# Patient Record
Sex: Female | Born: 1950
Health system: Southern US, Community
[De-identification: ages and names within clinical notes are randomized; demographics above are authoritative.]

## PROBLEM LIST (undated history)

## (undated) DIAGNOSIS — I10 Essential (primary) hypertension: Secondary | ICD-10-CM

## (undated) DIAGNOSIS — T7840XA Allergy, unspecified, initial encounter: Secondary | ICD-10-CM

## (undated) DIAGNOSIS — H8109 Meniere's disease, unspecified ear: Secondary | ICD-10-CM

## (undated) DIAGNOSIS — S92413A Displaced fracture of proximal phalanx of unspecified great toe, initial encounter for closed fracture: Secondary | ICD-10-CM

## (undated) DIAGNOSIS — E119 Type 2 diabetes mellitus without complications: Secondary | ICD-10-CM

## (undated) DIAGNOSIS — G629 Polyneuropathy, unspecified: Secondary | ICD-10-CM

## (undated) DIAGNOSIS — E785 Hyperlipidemia, unspecified: Secondary | ICD-10-CM

## (undated) DIAGNOSIS — E559 Vitamin D deficiency, unspecified: Secondary | ICD-10-CM

## (undated) DIAGNOSIS — E611 Iron deficiency: Secondary | ICD-10-CM

## (undated) HISTORY — DX: Allergy, unspecified, initial encounter: T78.40XA

## (undated) HISTORY — DX: Vitamin D deficiency, unspecified: E55.9

## (undated) HISTORY — DX: Hyperlipidemia, unspecified: E78.5

## (undated) HISTORY — DX: Polyneuropathy, unspecified: G62.9

## (undated) HISTORY — DX: Meniere's disease, unspecified ear: H81.09

## (undated) HISTORY — DX: Type 2 diabetes mellitus without complications: E11.9

## (undated) HISTORY — DX: Essential (primary) hypertension: I10

## (undated) HISTORY — DX: Iron deficiency: E61.1

---
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
## 1996-09-19 HISTORY — PX: ABDOMINAL HYSTERECTOMY: SHX81

## 1998-09-19 HISTORY — PX: ORIF ANKLE FRACTURE: SUR919

## 1999-07-19 ENCOUNTER — Encounter: Admission: RE | Admit: 1999-07-19 | Discharge: 1999-07-19 | Payer: Self-pay | Admitting: Obstetrics and Gynecology

## 1999-07-19 ENCOUNTER — Encounter: Payer: Self-pay | Admitting: Obstetrics and Gynecology

## 1999-11-01 ENCOUNTER — Ambulatory Visit (HOSPITAL_COMMUNITY): Admission: RE | Admit: 1999-11-01 | Discharge: 1999-11-01 | Payer: Self-pay | Admitting: Family Medicine

## 1999-11-01 ENCOUNTER — Encounter: Payer: Self-pay | Admitting: Family Medicine

## 2000-07-16 ENCOUNTER — Emergency Department (HOSPITAL_COMMUNITY): Admission: EM | Admit: 2000-07-16 | Discharge: 2000-07-16 | Payer: Self-pay | Admitting: Emergency Medicine

## 2001-07-02 ENCOUNTER — Encounter: Admission: RE | Admit: 2001-07-02 | Discharge: 2001-07-02 | Payer: Self-pay | Admitting: Family Medicine

## 2001-07-02 ENCOUNTER — Encounter: Payer: Self-pay | Admitting: Family Medicine

## 2001-10-05 ENCOUNTER — Encounter: Payer: Self-pay | Admitting: Orthopaedic Surgery

## 2001-10-05 ENCOUNTER — Encounter: Payer: Self-pay | Admitting: Emergency Medicine

## 2001-10-05 ENCOUNTER — Emergency Department (HOSPITAL_COMMUNITY): Admission: EM | Admit: 2001-10-05 | Discharge: 2001-10-05 | Payer: Self-pay | Admitting: Emergency Medicine

## 2002-11-01 ENCOUNTER — Encounter: Admission: RE | Admit: 2002-11-01 | Discharge: 2002-11-01 | Payer: Self-pay | Admitting: Internal Medicine

## 2002-11-01 ENCOUNTER — Encounter: Payer: Self-pay | Admitting: Internal Medicine

## 2003-02-18 ENCOUNTER — Encounter: Admission: RE | Admit: 2003-02-18 | Discharge: 2003-05-19 | Payer: Self-pay | Admitting: Internal Medicine

## 2003-08-05 ENCOUNTER — Encounter: Admission: RE | Admit: 2003-08-05 | Discharge: 2003-11-03 | Payer: Self-pay | Admitting: Internal Medicine

## 2005-10-10 ENCOUNTER — Emergency Department (HOSPITAL_COMMUNITY): Admission: EM | Admit: 2005-10-10 | Discharge: 2005-10-10 | Payer: Self-pay | Admitting: Emergency Medicine

## 2006-02-17 ENCOUNTER — Encounter: Admission: RE | Admit: 2006-02-17 | Discharge: 2006-02-17 | Payer: Self-pay | Admitting: Internal Medicine

## 2007-02-20 ENCOUNTER — Encounter: Admission: RE | Admit: 2007-02-20 | Discharge: 2007-02-20 | Payer: Self-pay | Admitting: Internal Medicine

## 2007-11-02 ENCOUNTER — Encounter: Admission: RE | Admit: 2007-11-02 | Discharge: 2007-11-02 | Payer: Self-pay | Admitting: Internal Medicine

## 2007-12-31 ENCOUNTER — Ambulatory Visit (HOSPITAL_COMMUNITY): Admission: RE | Admit: 2007-12-31 | Discharge: 2007-12-31 | Payer: Self-pay | Admitting: Internal Medicine

## 2008-02-22 ENCOUNTER — Encounter: Admission: RE | Admit: 2008-02-22 | Discharge: 2008-02-22 | Payer: Self-pay | Admitting: Internal Medicine

## 2009-03-13 ENCOUNTER — Ambulatory Visit: Payer: Self-pay | Admitting: Gastroenterology

## 2009-03-27 ENCOUNTER — Ambulatory Visit: Payer: Self-pay | Admitting: Gastroenterology

## 2009-07-14 ENCOUNTER — Encounter: Admission: RE | Admit: 2009-07-14 | Discharge: 2009-07-14 | Payer: Self-pay | Admitting: Internal Medicine

## 2009-10-22 ENCOUNTER — Emergency Department (HOSPITAL_COMMUNITY): Admission: EM | Admit: 2009-10-22 | Discharge: 2009-10-22 | Payer: Self-pay | Admitting: Emergency Medicine

## 2009-10-24 ENCOUNTER — Emergency Department (HOSPITAL_COMMUNITY): Admission: EM | Admit: 2009-10-24 | Discharge: 2009-10-24 | Payer: Self-pay | Admitting: Emergency Medicine

## 2009-11-03 ENCOUNTER — Emergency Department (HOSPITAL_COMMUNITY): Admission: EM | Admit: 2009-11-03 | Discharge: 2009-11-03 | Payer: Self-pay | Admitting: Emergency Medicine

## 2010-08-03 ENCOUNTER — Encounter: Admission: RE | Admit: 2010-08-03 | Discharge: 2010-08-03 | Payer: Self-pay | Admitting: Internal Medicine

## 2010-12-02 ENCOUNTER — Ambulatory Visit
Admission: RE | Admit: 2010-12-02 | Discharge: 2010-12-02 | Disposition: A | Discharge status: Home / Self Care | Payer: BC Managed Care – PPO | Source: Ambulatory Visit | Attending: Internal Medicine | Admitting: Internal Medicine

## 2010-12-02 ENCOUNTER — Other Ambulatory Visit: Payer: Self-pay | Admitting: Internal Medicine

## 2010-12-02 DIAGNOSIS — R059 Cough, unspecified: Secondary | ICD-10-CM

## 2010-12-02 DIAGNOSIS — R05 Cough: Secondary | ICD-10-CM

## 2010-12-26 LAB — GLUCOSE, CAPILLARY
Glucose-Capillary: 110 mg/dL — ABNORMAL HIGH (ref 70–99)
Glucose-Capillary: 127 mg/dL — ABNORMAL HIGH (ref 70–99)

## 2011-09-27 ENCOUNTER — Other Ambulatory Visit: Payer: Self-pay | Admitting: Internal Medicine

## 2011-09-27 DIAGNOSIS — Z1231 Encounter for screening mammogram for malignant neoplasm of breast: Secondary | ICD-10-CM

## 2011-10-20 ENCOUNTER — Other Ambulatory Visit: Payer: Self-pay | Admitting: Internal Medicine

## 2011-10-20 ENCOUNTER — Ambulatory Visit
Admission: RE | Admit: 2011-10-20 | Discharge: 2011-10-20 | Disposition: A | Discharge status: Home / Self Care | Payer: Managed Care, Other (non HMO) | Source: Ambulatory Visit | Attending: Internal Medicine | Admitting: Internal Medicine

## 2011-10-24 ENCOUNTER — Ambulatory Visit
Admission: RE | Admit: 2011-10-24 | Discharge: 2011-10-24 | Disposition: A | Discharge status: Home / Self Care | Payer: Managed Care, Other (non HMO) | Source: Ambulatory Visit | Attending: Internal Medicine | Admitting: Internal Medicine

## 2011-10-24 DIAGNOSIS — Z1231 Encounter for screening mammogram for malignant neoplasm of breast: Secondary | ICD-10-CM

## 2012-09-21 ENCOUNTER — Other Ambulatory Visit: Payer: Self-pay | Admitting: Internal Medicine

## 2012-09-21 DIAGNOSIS — K824 Cholesterolosis of gallbladder: Secondary | ICD-10-CM

## 2012-09-25 ENCOUNTER — Ambulatory Visit
Admission: RE | Admit: 2012-09-25 | Discharge: 2012-09-25 | Disposition: A | Discharge status: Home / Self Care | Payer: Managed Care, Other (non HMO) | Source: Ambulatory Visit | Attending: Internal Medicine | Admitting: Internal Medicine

## 2012-09-25 DIAGNOSIS — K824 Cholesterolosis of gallbladder: Secondary | ICD-10-CM

## 2012-11-05 ENCOUNTER — Other Ambulatory Visit (HOSPITAL_COMMUNITY): Payer: Self-pay | Admitting: Internal Medicine

## 2012-11-05 DIAGNOSIS — Z1231 Encounter for screening mammogram for malignant neoplasm of breast: Secondary | ICD-10-CM

## 2012-12-03 ENCOUNTER — Ambulatory Visit (HOSPITAL_COMMUNITY): Payer: Managed Care, Other (non HMO)

## 2012-12-10 ENCOUNTER — Other Ambulatory Visit (HOSPITAL_COMMUNITY): Payer: Self-pay | Admitting: Internal Medicine

## 2012-12-10 DIAGNOSIS — Z1231 Encounter for screening mammogram for malignant neoplasm of breast: Secondary | ICD-10-CM

## 2012-12-25 ENCOUNTER — Ambulatory Visit (HOSPITAL_COMMUNITY)
Admission: RE | Admit: 2012-12-25 | Discharge: 2012-12-25 | Disposition: A | Discharge status: Home / Self Care | Payer: Managed Care, Other (non HMO) | Source: Ambulatory Visit | Attending: Internal Medicine | Admitting: Internal Medicine

## 2012-12-25 ENCOUNTER — Ambulatory Visit (HOSPITAL_COMMUNITY): Payer: Managed Care, Other (non HMO)

## 2012-12-25 DIAGNOSIS — Z1231 Encounter for screening mammogram for malignant neoplasm of breast: Secondary | ICD-10-CM | POA: Insufficient documentation

## 2013-03-24 IMAGING — US US ABDOMEN COMPLETE
1 series · 14 of 25 positions shown · non-contrast
Comparison: Report of 11/01/1999.  Prior images not available.

CLINICAL DATA: Abdominal pain for 2 months.  Type 2 diabetes.

COMPLETE ABDOMINAL ULTRASOUND

[Series 1: us abdomen complete · 0.28mm/px · 14 of 67 slices shown]
[im 1/67]
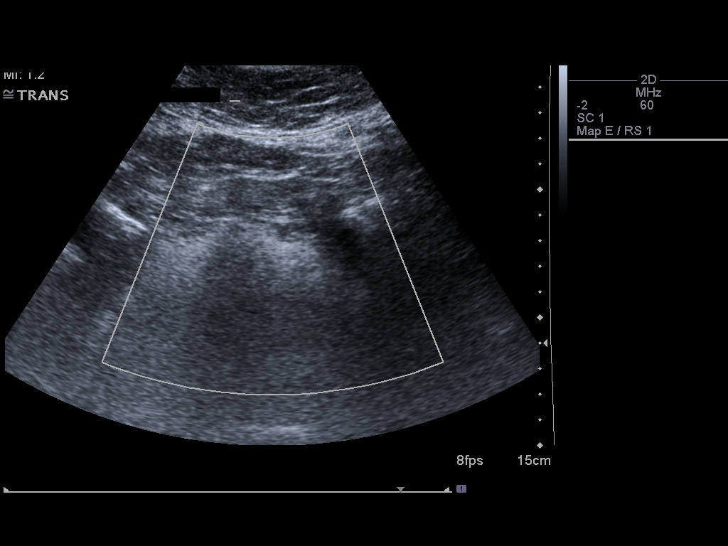
[im 6/67]
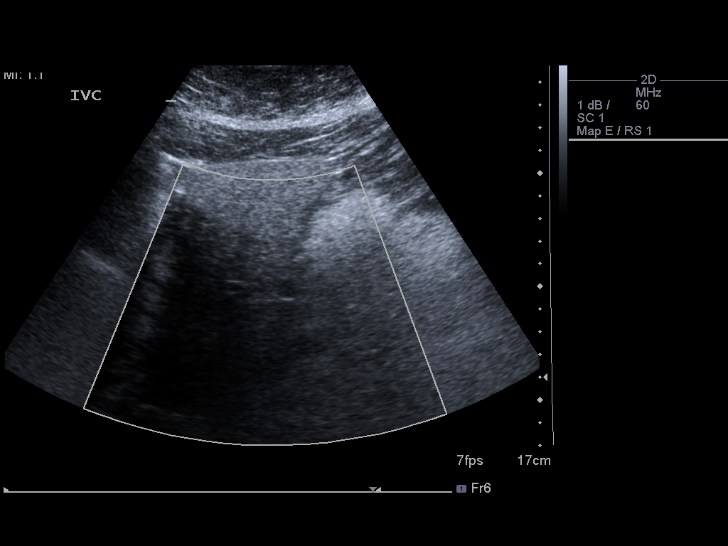
[im 12/67]
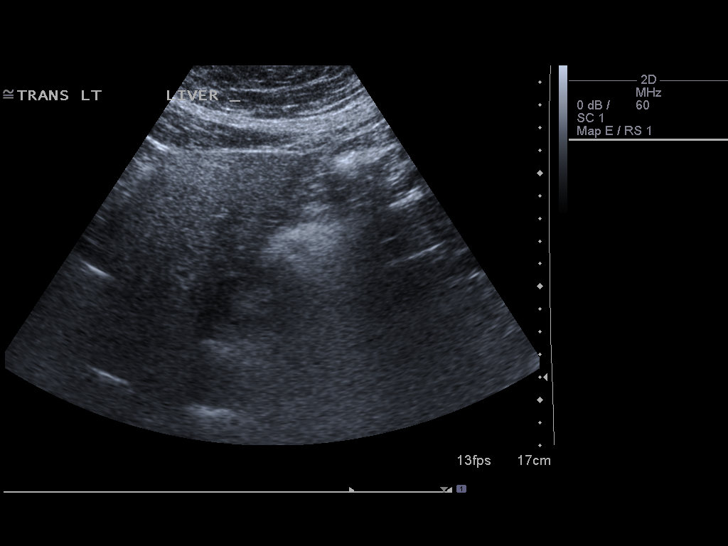
[im 17/67]
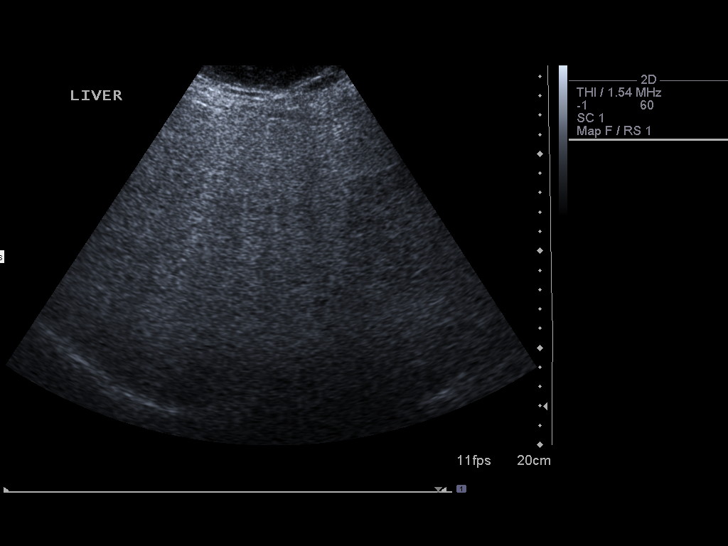
[im 23/67]
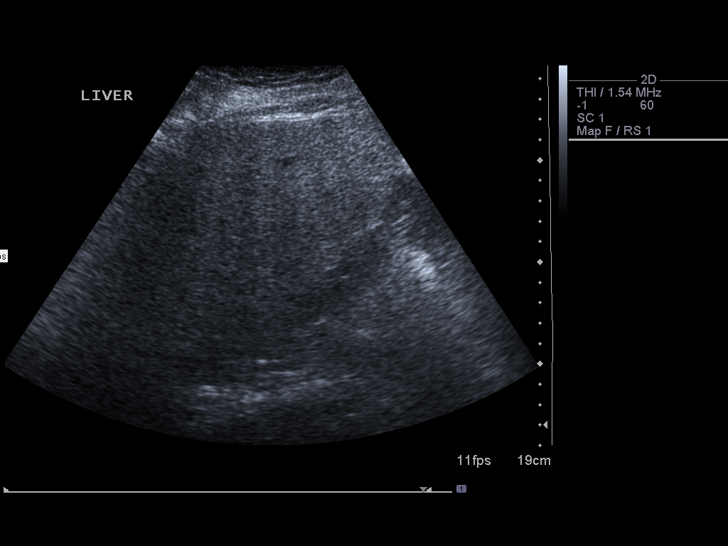
[im 25/67]
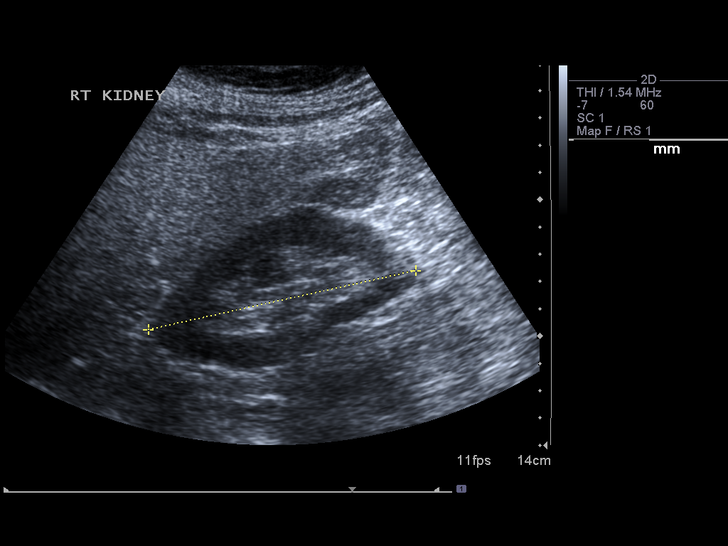
[im 31/67]
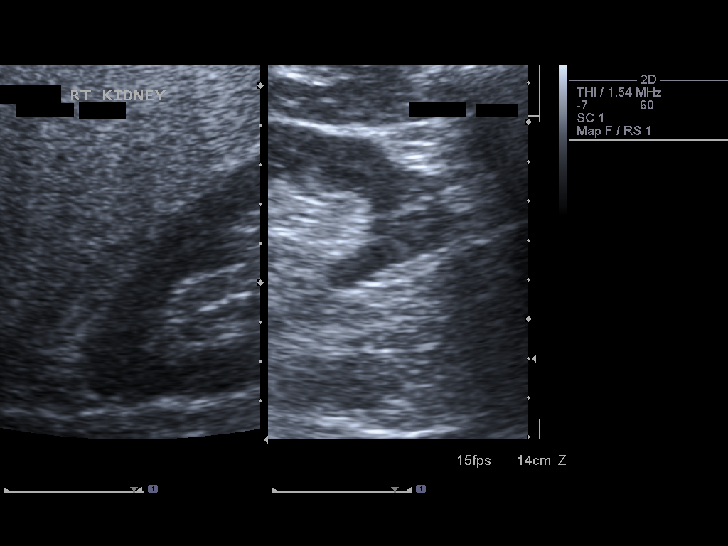
[im 36/67]
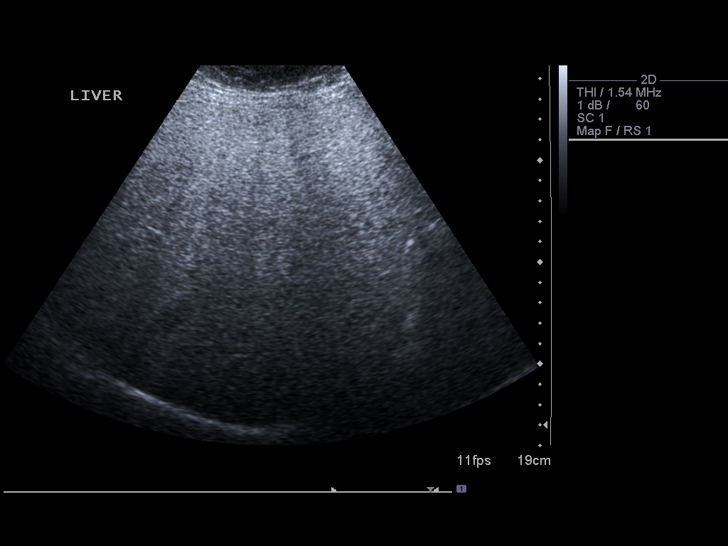
[im 42/67]
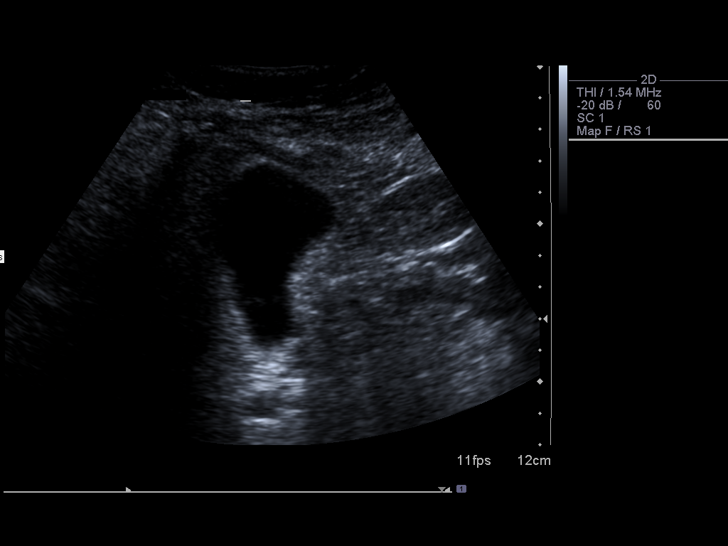
[im 45/67]
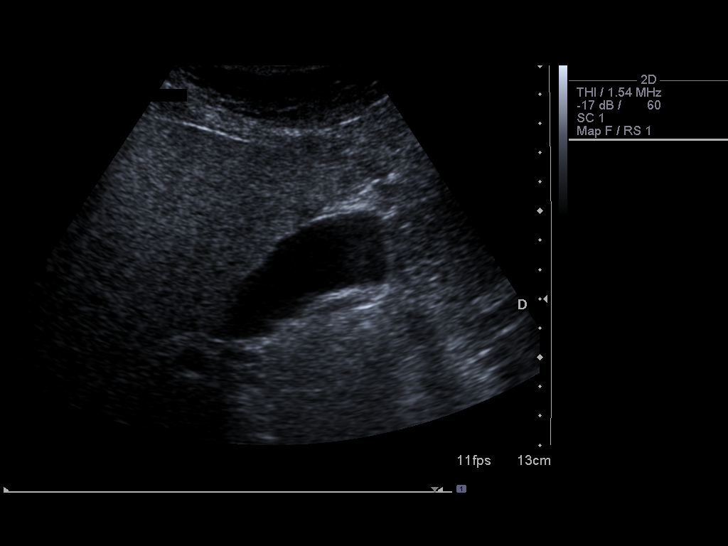
[im 50/67]
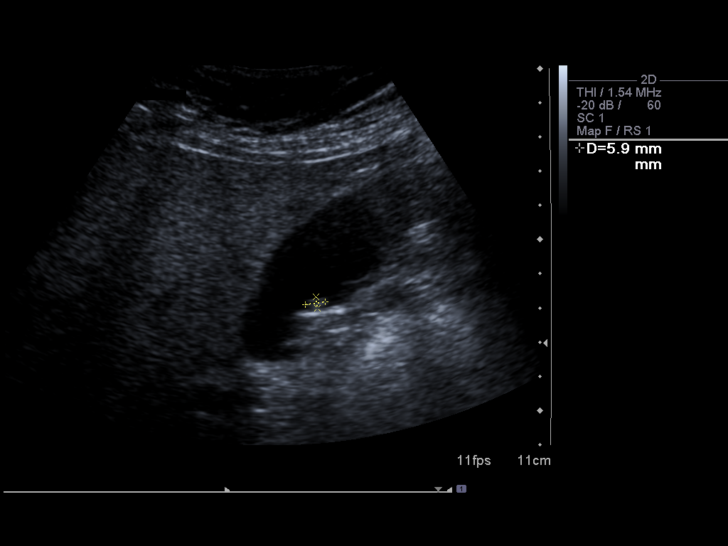
[im 56/67]
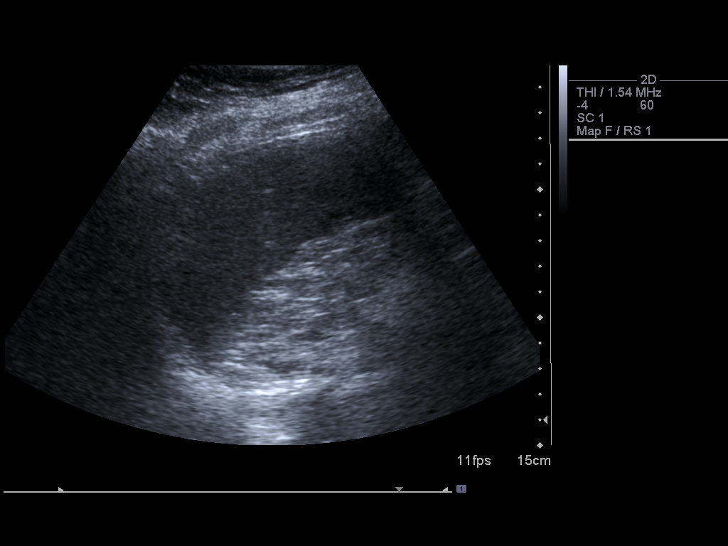
[im 61/67]
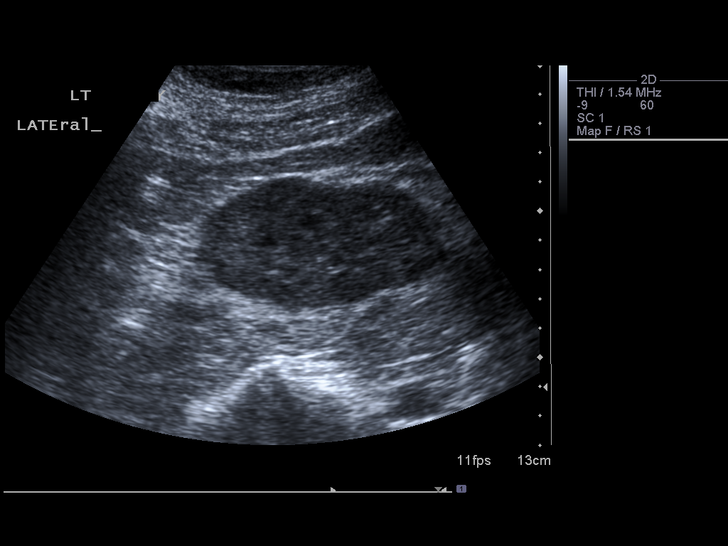
[im 67/67]
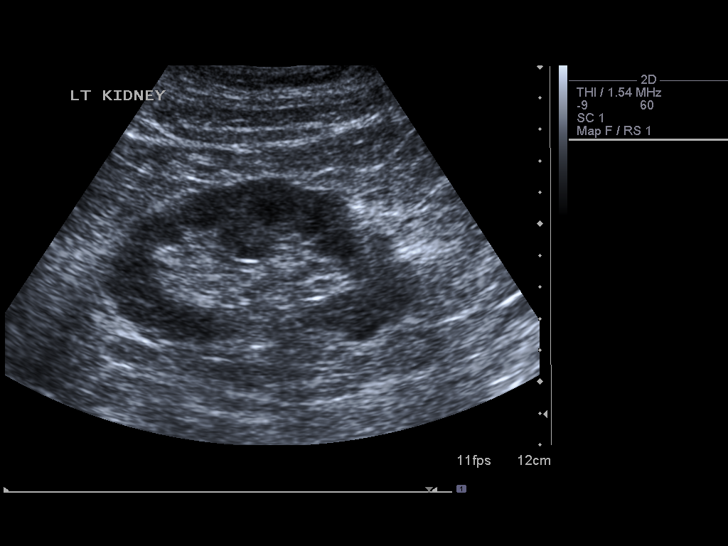

[14 of 25 positions shown; findings below may reference images not displayed]

FINDINGS: Gallbladder:  6 mm echogenic focus within the gallbladder is
immobile and not definitely shadowing.  No wall thickening or
pericholecystic fluid.

Common bile duct: Normal, 2 mm.

Liver: Moderate to markedly increased in echogenicity.

IVC: Negative

Pancreas:  Poorly visualized due to overlying bowel gas.

Spleen:  Normal in size and echogenicity.

Right Kidney:  10.5 cm. No hydronephrosis.

Left Kidney:  10.3 cm. No hydronephrosis.

Abdominal aorta:  Nonaneurysmal without ascites.
IMPRESSION: 1.  Hepatic steatosis, moderate to marked in severity.
2.  6 mm gallbladder polyp versus less likely nonshadowing stone.
Consider ultrasound follow-up at 1 year to confirm stability.
3.  Portions of anatomy obscured by bowel gas.

## 2013-10-20 ENCOUNTER — Other Ambulatory Visit: Payer: Self-pay | Admitting: Physician Assistant

## 2013-10-20 DIAGNOSIS — E559 Vitamin D deficiency, unspecified: Secondary | ICD-10-CM | POA: Insufficient documentation

## 2013-10-20 DIAGNOSIS — H8109 Meniere's disease, unspecified ear: Secondary | ICD-10-CM | POA: Insufficient documentation

## 2013-10-20 DIAGNOSIS — I10 Essential (primary) hypertension: Secondary | ICD-10-CM | POA: Insufficient documentation

## 2013-10-20 DIAGNOSIS — E1169 Type 2 diabetes mellitus with other specified complication: Secondary | ICD-10-CM | POA: Insufficient documentation

## 2013-10-20 DIAGNOSIS — E785 Hyperlipidemia, unspecified: Secondary | ICD-10-CM

## 2013-10-21 DIAGNOSIS — Z79899 Other long term (current) drug therapy: Secondary | ICD-10-CM | POA: Insufficient documentation

## 2013-10-21 NOTE — Progress Notes (Signed)
This very nice 63 y.o. female presents for 3 month follow up with Hypertension, Hyperlipidemia, Pre-Diabetes and Vitamin D Deficiency.    HTN predates since   . BP has been controlled at home. Today's   . Patient denies any cardiac type chest pain, palpitations, dyspnea/orthopnea/PND, dizziness, claudication, or dependent edema.   Hyperlipidemia is controlled with diet & meds. Last Cholesterol was  , Triglycerides were    , HDL    and LDL    . Patient denies myalgias or other med SE's.    Also, the patient has history of PreDiabetes/insulin resistance since     with last A1c of    . Patient denies any symptoms of reactive hypoglycemia, diabetic polys, paresthesias or visual blurring.   Further, Patient has history of Vitamin D Deficiency with last vitamin D of   . Patient supplements vitamin D without any suspected side-effects.    Medication List       This list is accurate as of: 10/21/13  1:31 PM.  Always use your most recent med list.               aspirin 81 MG chewable tablet  Chew by mouth daily.     bisoprolol-hydrochlorothiazide 10-6.25 MG per tablet  Commonly known as:  ZIAC  Take 1 tablet by mouth daily.     hydrochlorothiazide 12.5 MG capsule  Commonly known as:  MICROZIDE  Take 12.5 mg by mouth daily.     LEVSIN 0.125 MG tablet  Generic drug:  hyoscyamine  Take 0.125 mg by mouth every 4 (four) hours as needed.     Magnesium 500 MG Caps  Take 500 mg by mouth daily.     metFORMIN 500 MG 24 hr tablet  Commonly known as:  GLUCOPHAGE-XR  Take 500 mg by mouth QID.     montelukast 10 MG tablet  Commonly known as:  SINGULAIR  TAKE 1 TABLET BY MOUTH EVERY DAY     phentermine 37.5 MG tablet  Commonly known as:  ADIPEX-P  Take 37.5 mg by mouth daily before breakfast.     promethazine 25 MG tablet  Commonly known as:  PHENERGAN  Take 25 mg by mouth every 6 (six) hours as needed for nausea or vomiting.     simvastatin 20 MG tablet  Commonly known as:  ZOCOR  Take  20 mg by mouth daily.     VITAMIN D PO  Take 4,000 Int'l Units by mouth daily.         Allergies  Allergen Reactions  . Penicillins     PMHx:   Past Medical History  Diagnosis Date  . Hyperlipidemia   . Hypertension   . Type II or unspecified type diabetes mellitus without mention of complication, not stated as uncontrolled   . Neuropathy   . Allergy   . Iron deficiency   . Vitamin D deficiency   . Meniere's disease     FHx:    Reviewed / unchanged  SHx:    Reviewed / unchanged  Systems Review: Constitutional: Denies fever, chills, wt changes, headaches, insomnia, fatigue, night sweats, change in appetite. Eyes: Denies redness, blurred vision, diplopia, discharge, itchy, watery eyes.  ENT: Denies discharge, congestion, post nasal drip, epistaxis, sore throat, earache, hearing loss, dental pain, tinnitus, vertigo, sinus pain, snoring.  CV: Denies chest pain, palpitations, irregular heartbeat, syncope, dyspnea, diaphoresis, orthopnea, PND, claudication, edema. Respiratory: denies cough, dyspnea, DOE, pleurisy, hoarseness, laryngitis, wheezing.  Gastrointestinal: Denies dysphagia, odynophagia, heartburn, reflux, water  brash, abdominal pain or cramps, nausea, vomiting, bloating, diarrhea, constipation, hematemesis, melena, hematochezia,  or hemorrhoids. Genitourinary: Denies dysuria, frequency, urgency, nocturia, hesitancy, discharge, hematuria, flank pain. Musculoskeletal: Denies arthralgias, myalgias, stiffness, jt. swelling, pain, limp, strain/sprain.  Skin: Denies pruritus, rash, hives, warts, acne, eczema, change in skin lesion(s). Neuro: No weakness, tremor, incoordination, spasms, paresthesia, or pain. Psychiatric: Denies confusion, memory loss, or sensory loss. Endo: Denies change in weight, skin, hair change.  Heme/Lymph: No excessive bleeding, bruising, orenlarged lymph nodes.  There were no vitals filed for this visit.  There is no height or weight on file to  calculate BMI.  On Exam: Appears well nourished - in no distress. Eyes: PERRLA, EOMs, conjunctiva no swelling or erythema. Sinuses: No frontal/maxillary tenderness ENT/Mouth: EAC's clear, TM's nl w/o erythema, bulging. Nares clear w/o erythema, swelling, exudates. Oropharynx clear without erythema or exudates. Oral hygiene is good. Tongue normal, non obstructing. Hearing intact.  Neck: Supple. Thyroid nl. Car 2+/2+ without bruits, nodes or JVD. Chest: Respirations nl with BS clear & equal w/o rales, rhonchi, wheezing or stridor.  Cor: Heart sounds normal w/ regular rate and rhythm without sig. murmurs, gallops, clicks, or rubs. Peripheral pulses normal and equal  without edema.  Abdomen: Soft & bowel sounds normal. Non-tender w/o guarding, rebound, hernias, masses, or organomegaly.  Lymphatics: Unremarkable.  Musculoskeletal: Full ROM all peripheral extremities, joint stability, 5/5 strength, and normal gait.  Skin: Warm, dry without exposed rashes, lesions, ecchymosis apparent.  Neuro: Cranial nerves intact, reflexes equal bilaterally. Sensory-motor testing grossly intact. Tendon reflexes grossly intact.  Pysch: Alert & oriented x 3. Insight and judgement nl & appropriate. No ideations.  Assessment and Plan:  1. Hypertension - Continue monitor blood pressure at home. Continue diet/meds same.  2. Hyperlipidemia - Continue diet/meds, exercise,& lifestyle modifications. Continue monitor periodic cholesterol/liver & renal functions   3. Pre-diabetes/Insulin Resistance - Continue diet, exercise, lifestyle modifications. Monitor appropriate labs.  3. Diabetes - continue recommend prudent low glycemic diet, weight control, regular exercise, diabetic monitoring and periodic eye exams.  4. Vitamin D Deficiency - Continue supplementation.  Recommended regular exercise, BP monitoring, weight control, and discussed med and SE's. Recommended labs to assess and monitor clinical status. Further  disposition pending results of labs.

## 2013-10-21 NOTE — Patient Instructions (Addendum)
Onychomycosis/Fungal Toenails  WHAT IS IT? An infection that lies within the keratin of your nail plate that is caused by a fungus.  WHY ME? Fungal infections affect all ages, sexes, races, and creeds.  There may be many factors that predispose you to a fungal infection such as age, coexisting medical conditions such as diabetes, or an autoimmune disease; stress, medications, fatigue, genetics, etc.  Bottom line: fungus thrives in a warm, moist environment and your shoes offer such a location.  IS IT CONTAGIOUS? Theoretically, yes.  You do not want to share shoes, nail clippers or files with someone who has fungal toenails.  Walking around barefoot in the same room or sleeping in the same bed is unlikely to transfer the organism.  It is important to realize, however, that fungus can spread easily from one nail to the next on the same foot.  HOW DO WE TREAT THIS?  There are several ways to treat this condition.  Treatment may depend on many factors such as age, medications, pregnancy, liver and kidney conditions, etc.  It is best to ask your doctor which options are available to you.  1. No treatment.   Unlike many other medical concerns, you can live with this condition.  However for many people this can be a painful condition and may lead to ingrown toenails or a bacterial infection.  It is recommended that you keep the nails cut short to help reduce the amount of fungal nail. 2. Topical treatment.  These range from herbal remedies to prescription strength nail lacquers.  About 40-50% effective, topicals require twice daily application for approximately 9 to 12 months or until an entirely new nail has grown out.  The most effective topicals are medical grade medications available through physicians offices. 3. Oral antifungal medications.  With an 80-90% cure rate, the most common oral medication requires 3 to 4 months of therapy and stays in your system for a year as the new nail grows out.  Oral  antifungal medications do require blood work to make sure it is a safe drug for you.  A liver function panel will be performed prior to starting the medication and after the first month of treatment.  It is important to have the blood work performed to avoid any harmful side effects.  In general, this medication safe but blood work is required. 4. Laser Therapy.  This treatment is performed by applying a specialized laser to the affected nail plate.  This therapy is noninvasive, fast, and non-painful.  It is not covered by insurance and is therefore, out of pocket.  The results have been very good with a 80-95% cure rate.  [facility] is the only practice in the area to offer this therapy. 5. Permanent Nail Avulsion.  Removing the entire nail so that a new nail will not grow back.   Hypertension As your heart beats, it forces blood through your arteries. This force is your blood pressure. If the pressure is too high, it is called hypertension (HTN) or high blood pressure. HTN is dangerous because you may have it and not know it. High blood pressure may mean that your heart has to work harder to pump blood. Your arteries may be narrow or stiff. The extra work puts you at risk for heart disease, stroke, and other problems.  Blood pressure consists of two numbers, a higher number over a lower, 110/72, for example. It is stated as "110 over 72." The ideal is below 120  for the top number (systolic) and under 80 for the bottom (diastolic). Write down your blood pressure today. You should pay close attention to your blood pressure if you have certain conditions such as:  Heart failure.  Prior heart attack.  Diabetes  Chronic kidney disease.  Prior stroke.  Multiple risk factors for heart disease. To see if you have HTN, your blood pressure should be measured while you are seated with your arm held at the level of the heart. It should be measured at least twice. A one-time elevated blood  pressure reading (especially in the Emergency Department) does not mean that you need treatment. There may be conditions in which the blood pressure is different between your right and left arms. It is important to see your caregiver soon for a recheck. Most people have essential hypertension which means that there is not a specific cause. This type of high blood pressure may be lowered by changing lifestyle factors such as:  Stress.  Smoking.  Lack of exercise.  Excessive weight.  Drug/tobacco/alcohol use.  Eating less salt. Most people do not have symptoms from high blood pressure until it has caused damage to the body. Effective treatment can often prevent, delay or reduce that damage. TREATMENT  When a cause has been identified, treatment for high blood pressure is directed at the cause. There are a large number of medications to treat HTN. These fall into several categories, and your caregiver will help you select the medicines that are best for you. Medications may have side effects. You should review side effects with your caregiver. If your blood pressure stays high after you have made lifestyle changes or started on medicines,   Your medication(s) may need to be changed.  Other problems may need to be addressed.  Be certain you understand your prescriptions, and know how and when to take your medicine.  Be sure to follow up with your caregiver within the time frame advised (usually within two weeks) to have your blood pressure rechecked and to review your medications.  If you are taking more than one medicine to lower your blood pressure, make sure you know how and at what times they should be taken. Taking two medicines at the same time can result in blood pressure that is too low. SEEK IMMEDIATE MEDICAL CARE IF:  You develop a severe headache, blurred or changing vision, or confusion.  You have unusual weakness or numbness, or a faint feeling.  You have severe chest or  abdominal pain, vomiting, or breathing problems. MAKE SURE YOU:   Understand these instructions.  Will watch your condition.  Will get help right away if you are not doing well or get worse.   Diabetes and Exercise Exercising regularly is important. It is not just about losing weight. It has many health benefits, such as:  Improving your overall fitness, flexibility, and endurance.  Increasing your bone density.  Helping with weight control.  Decreasing your body fat.  Increasing your muscle strength.  Reducing stress and tension.  Improving your overall health. People with diabetes who exercise gain additional benefits because exercise:  Reduces appetite.  Improves the body's use of blood sugar (glucose).  Helps lower or control blood glucose.  Decreases blood pressure.  Helps control blood lipids (such as cholesterol and triglycerides).  Improves the body's use of the hormone insulin by:  Increasing the body's insulin sensitivity.  Reducing the body's insulin needs.  Decreases the risk for heart disease because exercising:  Lowers cholesterol  and triglycerides levels.  Increases the levels of good cholesterol (such as high-density lipoproteins [HDL]) in the body.  Lowers blood glucose levels. YOUR ACTIVITY PLAN  Choose an activity that you enjoy and set realistic goals. Your health care provider or diabetes educator can help you make an activity plan that works for you. You can break activities into 2 or 3 sessions throughout the day. Doing so is as good as one long session. Exercise ideas include:  Taking the dog for a walk.  Taking the stairs instead of the elevator.  Dancing to your favorite song.  Doing your favorite exercise with a friend. RECOMMENDATIONS FOR EXERCISING WITH TYPE 1 OR TYPE 2 DIABETES   Check your blood glucose before exercising. If blood glucose levels are greater than 240 mg/dL, check for urine ketones. Do not exercise if ketones  are present.  Avoid injecting insulin into areas of the body that are going to be exercised. For example, avoid injecting insulin into:  The arms when playing tennis.  The legs when jogging.  Keep a record of:  Food intake before and after you exercise.  Expected peak times of insulin action.  Blood glucose levels before and after you exercise.  The type and amount of exercise you have done.  Review your records with your health care provider. Your health care provider will help you to develop guidelines for adjusting food intake and insulin amounts before and after exercising.  If you take insulin or oral hypoglycemic agents, watch for signs and symptoms of hypoglycemia. They include:  Dizziness.  Shaking.  Sweating.  Chills.  Confusion.  Drink plenty of water while you exercise to prevent dehydration or heat stroke. Body water is lost during exercise and must be replaced.  Talk to your health care provider before starting an exercise program to make sure it is safe for you. Remember, almost any type of activity is better than none.    Cholesterol Cholesterol is a white, waxy, fat-like protein needed by your body in small amounts. The liver makes all the cholesterol you need. It is carried from the liver by the blood through the blood vessels. Deposits (plaque) may build up on blood vessel walls. This makes the arteries narrower and stiffer. Plaque increases the risk for heart attack and stroke. You cannot feel your cholesterol level even if it is very high. The only way to know is by a blood test to check your lipid (fats) levels. Once you know your cholesterol levels, you should keep a record of the test results. Work with your caregiver to to keep your levels in the desired range. WHAT THE RESULTS MEAN:  Total cholesterol is a rough measure of all the cholesterol in your blood.  LDL is the so-called bad cholesterol. This is the type that deposits cholesterol in the  walls of the arteries. You want this level to be low.  HDL is the good cholesterol because it cleans the arteries and carries the LDL away. You want this level to be high.  Triglycerides are fat that the body can either burn for energy or store. High levels are closely linked to heart disease. DESIRED LEVELS:  Total cholesterol below 200.  LDL below 100 for people at risk, below 70 for very high risk.  HDL above 50 is good, above 60 is best.  Triglycerides below 150. HOW TO LOWER YOUR CHOLESTEROL:  Diet.  Choose fish or white meat chicken and [place], roasted or baked. Limit fatty cuts of red  meat, fried foods, and processed meats, such as sausage and lunch meat.  Eat lots of fresh fruits and vegetables. Choose whole grains, beans, pasta, potatoes and cereals.  Use only small amounts of olive, corn or canola oils. Avoid butter, mayonnaise, shortening or palm kernel oils. Avoid foods with trans-fats.  Use skim/nonfat milk and low-fat/nonfat yogurt and cheeses. Avoid whole milk, cream, ice cream, egg yolks and cheeses. Healthy desserts include angel food cake, ginger snaps, animal crackers, hard candy, popsicles, and low-fat/nonfat frozen yogurt. Avoid pastries, cakes, pies and cookies.  Exercise.  A regular program helps decrease LDL and raises HDL.  Helps with weight control.  Do things that increase your activity level like gardening, walking, or taking the stairs.  Medication.  May be prescribed by your caregiver to help lowering cholesterol and the risk for heart disease.  You may need medicine even if your levels are normal if you have several risk factors. HOME CARE INSTRUCTIONS   Follow your diet and exercise programs as suggested by your caregiver.  Take medications as directed.  Have blood work done when your caregiver feels it is necessary. MAKE SURE YOU:   Understand these instructions.  Will watch your condition.  Will get help right away if you are not  doing well or get worse.      Vitamin D Deficiency Vitamin D is an important vitamin that your body needs. Having too little of it in your body is called a deficiency. A very bad deficiency can make your bones soft and can cause a condition called rickets.  Vitamin D is important to your body for different reasons, such as:   It helps your body absorb 2 minerals called calcium and phosphorus.  It helps make your bones healthy.  It may prevent some diseases, such as diabetes and multiple sclerosis.  It helps your muscles and heart. You can get vitamin D in several ways. It is a natural part of some foods. The vitamin is also added to some dairy products and cereals. Some people take vitamin D supplements. Also, your body makes vitamin D when you are in the sun. It changes the sun's rays into a form of the vitamin that your body can use. CAUSES   Not eating enough foods that contain vitamin D.  Not getting enough sunlight.  Having certain digestive system diseases that make it hard to absorb vitamin D. These diseases include Crohn's disease, chronic pancreatitis, and cystic fibrosis.  Having a surgery in which part of the stomach or small intestine is removed.  Being obese. Fat cells pull vitamin D out of your blood. That means that obese people may not have enough vitamin D left in their blood and in other body tissues.  Having chronic kidney or liver disease. RISK FACTORS Risk factors are things that make you more likely to develop a vitamin D deficiency. They include:  Being older.  Not being able to get outside very much.  Living in a nursing home.  Having had broken bones.  Having weak or thin bones (osteoporosis).  Having a disease or condition that changes how your body absorbs vitamin D.  Having dark skin.  Some medicines such as seizure medicines or steroids.  Being overweight or obese. SYMPTOMS Mild cases of vitamin D deficiency may not have any symptoms.  If you have a very bad case, symptoms may include:  Bone pain.  Muscle pain.  Falling often.  Broken bones caused by a minor injury, due to osteoporosis.  DIAGNOSIS A blood test is the best way to tell if you have a vitamin D deficiency. TREATMENT Vitamin D deficiency can be treated in different ways. Treatment for vitamin D deficiency depends on what is causing it. Options include:  Taking vitamin D supplements.  Taking a calcium supplement. Your caregiver will suggest what dose is best for you. HOME CARE INSTRUCTIONS  Take any supplements that your caregiver prescribes. Follow the directions carefully. Take only the suggested amount.  Have your blood tested 2 months after you start taking supplements.  Eat foods that contain vitamin D. Healthy choices include:  Fortified dairy products, cereals, or juices. Fortified means vitamin D has been added to the food. Check the label on the package to be sure.  Fatty fish like salmon or trout.  Eggs.  Oysters.  Do not use a tanning bed.  Keep your weight at a healthy level. Lose weight if you need to.  Keep all follow-up appointments. Your caregiver will need to perform blood tests to make sure your vitamin D deficiency is going away. SEEK MEDICAL CARE IF:  You have any questions about your treatment.  You continue to have symptoms of vitamin D deficiency.  You have nausea or vomiting.  You are constipated.  You feel confused.  You have severe abdominal or back pain. MAKE SURE YOU:  Understand these instructions.  Will watch your condition.  Will get help right away if you are not doing well or get worse.

## 2013-10-22 ENCOUNTER — Ambulatory Visit (INDEPENDENT_AMBULATORY_CARE_PROVIDER_SITE_OTHER): Payer: BC Managed Care – PPO | Admitting: Internal Medicine

## 2013-10-22 ENCOUNTER — Encounter: Payer: Self-pay | Admitting: Internal Medicine

## 2013-10-22 VITALS — BP 132/82 | HR 88 | Temp 98.6°F | Resp 16 | Wt 164.3 lb

## 2013-10-22 DIAGNOSIS — E119 Type 2 diabetes mellitus without complications: Secondary | ICD-10-CM

## 2013-10-22 DIAGNOSIS — E785 Hyperlipidemia, unspecified: Secondary | ICD-10-CM

## 2013-10-22 DIAGNOSIS — I10 Essential (primary) hypertension: Secondary | ICD-10-CM

## 2013-10-22 DIAGNOSIS — B351 Tinea unguium: Secondary | ICD-10-CM

## 2013-10-22 DIAGNOSIS — E559 Vitamin D deficiency, unspecified: Secondary | ICD-10-CM

## 2013-10-22 DIAGNOSIS — E782 Mixed hyperlipidemia: Secondary | ICD-10-CM

## 2013-10-22 DIAGNOSIS — Z79899 Other long term (current) drug therapy: Secondary | ICD-10-CM

## 2013-10-22 DIAGNOSIS — R7309 Other abnormal glucose: Secondary | ICD-10-CM

## 2013-10-22 DIAGNOSIS — J301 Allergic rhinitis due to pollen: Secondary | ICD-10-CM

## 2013-10-22 LAB — CBC WITH DIFFERENTIAL/PLATELET
BASOS ABS: 0 10*3/uL (ref 0.0–0.1)
BASOS PCT: 0 % (ref 0–1)
EOS PCT: 3 % (ref 0–5)
Eosinophils Absolute: 0.3 10*3/uL (ref 0.0–0.7)
HEMATOCRIT: 36.6 % (ref 36.0–46.0)
HEMOGLOBIN: 12.5 g/dL (ref 12.0–15.0)
Lymphocytes Relative: 31 % (ref 12–46)
Lymphs Abs: 3.3 10*3/uL (ref 0.7–4.0)
MCH: 26.4 pg (ref 26.0–34.0)
MCHC: 34.2 g/dL (ref 30.0–36.0)
MCV: 77.2 fL — ABNORMAL LOW (ref 78.0–100.0)
MONOS PCT: 5 % (ref 3–12)
Monocytes Absolute: 0.5 10*3/uL (ref 0.1–1.0)
NEUTROS PCT: 61 % (ref 43–77)
Neutro Abs: 6.4 10*3/uL (ref 1.7–7.7)
PLATELETS: 311 10*3/uL (ref 150–400)
RBC: 4.74 MIL/uL (ref 3.87–5.11)
RDW: 15.1 % (ref 11.5–15.5)
WBC: 10.6 10*3/uL — AB (ref 4.0–10.5)

## 2013-10-22 LAB — HEMOGLOBIN A1C
Hgb A1c MFr Bld: 6 % — ABNORMAL HIGH (ref <5.7)
Mean Plasma Glucose: 126 mg/dL — ABNORMAL HIGH (ref <117)

## 2013-10-22 MED ORDER — TERBINAFINE HCL 250 MG PO TABS: 250.0000 mg | ORAL_TABLET | Freq: Every day | ORAL | Status: DC

## 2013-10-22 MED ORDER — PHENTERMINE HCL 37.5 MG PO TABS: ORAL_TABLET | ORAL | Status: DC

## 2013-10-22 MED ORDER — HYDROCHLOROTHIAZIDE 25 MG PO TABS: 25.0000 mg | ORAL_TABLET | Freq: Every day | ORAL | Status: DC

## 2013-10-22 MED ORDER — MONTELUKAST SODIUM 10 MG PO TABS: 10.0000 mg | ORAL_TABLET | Freq: Every day | ORAL | Status: DC

## 2013-10-22 NOTE — Progress Notes (Signed)
Patient ID: Regina Cervantes, female   DOB: December 21, 1950, 63 y.o.   MRN: [id number]   This very nice 63 y.o. female presents for 3 month follow up with Hypertension, Hyperlipidemia, T2 NIDDM, Menire's and Vitamin D Deficiency.    HTN predates since 2004. BP has been controlled at home. Today's BP: 132/82 mmHg. Patient denies any cardiac type chest pain, palpitations, dyspnea/orthopnea/PND, dizziness, claudication, or dependent edema.   Hyperlipidemia is controlled with diet & meds. Last Cholesterol was 146, Triglycerides were 160, HDL 44 and LDL 70 in Oct 2014 - at goal. Patient denies myalgias or other med SE's.    Also, the patient has history of T2 NIDDM predating  since 2004 with last A1c of 6.3% in Oct 2014 and she reports CBG's range 90-110 mg%.  Of note is a 20# weight loss in part attributed to Phentermine. Patient denies any symptoms of reactive hypoglycemia, diabetic polys, paresthesias or visual blurring.   Further, Patient has history of Vitamin D Deficiency with last vitamin D of 37 in Oct 2014. Patient supplements vitamin D without any suspected side-effects.    Medication List         aspirin 81 MG chewable tablet  Chew by mouth daily.     bisoprolol-hydrochlorothiazide 5-6.25 MG per tablet  Commonly known as:  ZIAC  Take 1 tablet by mouth daily.     cetirizine 10 MG tablet  Commonly known as:  ZYRTEC  Take 10 mg by mouth daily.     ferrous sulfate dried 160 (50 FE) MG Tbcr SR tablet  Commonly known as:  SLOW FE  Take 160 mg by mouth daily.     hydrochlorothiazide 25 MG tablet  Commonly known as:  HYDRODIURIL  Take 1 tablet (25 mg total) by mouth daily. For BP and Fluid     Magnesium 500 MG Caps  Take 500 mg by mouth daily.     meclizine 25 MG tablet  Commonly known as:  ANTIVERT  Take 25 mg by mouth 3 (three) times daily as needed for dizziness.     METFORMIN HCL ER PO  Take 1,000 mg by mouth 2 (two) times daily.     montelukast 10 MG tablet  Commonly  known as:  SINGULAIR  Take 1 tablet (10 mg total) by mouth daily. For allergies     phentermine 37.5 MG tablet  Commonly known as:  ADIPEX-P  1/2 to 1 tablet daily for weight loss     promethazine 25 MG tablet  Commonly known as:  PHENERGAN  Take 25 mg by mouth every 6 (six) hours as needed for nausea or vomiting.     simvastatin 20 MG tablet  Commonly known as:  ZOCOR  Take 20 mg by mouth daily.     terbinafine 250 MG tablet  Commonly known as:  LAMISIL  Take 1 tablet (250 mg total) by mouth daily. For toenail fungus - need to check liver enzymes in 6 weeks     VITAMIN D PO  Take 2,000 Int'l Units by mouth 2 (two) times daily.         Allergies  Allergen Reactions  . Penicillins     PMHx:   Past Medical History  Diagnosis Date  . Hyperlipidemia   . Hypertension   . Type II or unspecified type diabetes mellitus without mention of complication, not stated as uncontrolled   . Neuropathy   . Allergy   . Iron deficiency   . Vitamin D deficiency   .  Meniere's disease     FHx:    Reviewed / unchanged  SHx:    Reviewed / unchanged  Systems Review: Constitutional: Denies fever, chills, wt changes, headaches, insomnia, fatigue, night sweats, change in appetite. Eyes: Denies redness, blurred vision, diplopia, discharge, itchy, watery eyes.  ENT: Denies discharge, congestion, post nasal drip, epistaxis, sore throat, earache, hearing loss, dental pain, tinnitus, vertigo, sinus pain, snoring.  CV: Denies chest pain, palpitations, irregular heartbeat, syncope, dyspnea, diaphoresis, orthopnea, PND, claudication, edema. Respiratory: denies cough, dyspnea, DOE, pleurisy, hoarseness, laryngitis, wheezing.  Gastrointestinal: Denies dysphagia, odynophagia, heartburn, reflux, water brash, abdominal pain or cramps, nausea, vomiting, bloating, diarrhea, constipation, hematemesis, melena, hematochezia,  or hemorrhoids. Genitourinary: Denies dysuria, frequency, urgency, nocturia,  hesitancy, discharge, hematuria, flank pain. Musculoskeletal: Denies arthralgias, myalgias, stiffness, jt. swelling, pain, limp, strain/sprain.  Skin: Denies pruritus, rash, hives, warts, acne, eczema, change in skin lesion(s). Does c/o of several thickened yellowish chalky dystrophic toenails. Neuro: No weakness, tremor, incoordination, spasms, paresthesia, or pain. Psychiatric: Denies confusion, memory loss, or sensory loss. Endo: Denies change in weight, skin, hair change.  Heme/Lymph: No excessive bleeding, bruising, orenlarged lymph nodes.  BP: 132/82  Pulse: 88  Temp: 98.6 F (37 C)  Resp: 16      On Exam:  Appears well nourished - in no distress. Eyes: PERRLA, EOMs, conjunctiva no swelling or erythema. Sinuses: No frontal/maxillary tenderness ENT/Mouth: EAC's clear, TM's nl w/o erythema, bulging. Nares clear w/o erythema, swelling, exudates. Oropharynx clear without erythema or exudates. Oral hygiene is good. Tongue normal, non obstructing. Hearing intact.  Neck: Supple. Thyroid nl. Car 2+/2+ without bruits, nodes or JVD. Chest: Respirations nl with BS clear & equal w/o rales, rhonchi, wheezing or stridor.  Cor: Heart sounds normal w/ regular rate and rhythm without sig. murmurs, gallops, clicks, or rubs. Peripheral pulses normal and equal  without edema.  Abdomen: Soft & bowel sounds normal. Non-tender w/o guarding, rebound, hernias, masses, or organomegaly.  Lymphatics: Unremarkable.  Musculoskeletal: Full ROM all peripheral extremities, joint stability, 5/5 strength, and normal gait.  Skin: Warm, dry without exposed rashes, lesions, ecchymosis apparent.  Sever moderately onychomycotic toenails Neuro: Cranial nerves intact, reflexes equal bilaterally. Sensory-motor testing grossly intact. Tendon reflexes grossly intact.  Pysch: Alert & oriented x 3. Insight and judgement nl & appropriate. No ideations.  Assessment and Plan:  1. Hypertension - Continue monitor blood  pressure at home. Continue diet/meds same.  2. Hyperlipidemia - Continue diet/meds, exercise,& lifestyle modifications. Continue monitor periodic cholesterol/liver & renal functions   3. T2 NIDDM - continue recommend prudent low glycemic diet, weight control, regular exercise, diabetic monitoring and periodic eye exams.  4. Vitamin D Deficiency - Continue supplementation.  5. Onychomycosis toenails - lamisil 250 mg #90 1 qd x 990 days - check HFP in 6 weeks  Recommended regular exercise, BP monitoring, weight control, and discussed med and SE's. Recommended labs to assess and monitor clinical status. Further disposition pending results of labs.

## 2013-10-23 LAB — HEPATIC FUNCTION PANEL
ALBUMIN: 4.6 g/dL (ref 3.5–5.2)
ALK PHOS: 74 U/L (ref 39–117)
ALT: 21 U/L (ref 0–35)
AST: 19 U/L (ref 0–37)
BILIRUBIN DIRECT: 0.1 mg/dL (ref 0.0–0.3)
BILIRUBIN INDIRECT: 0.4 mg/dL (ref 0.2–1.2)
BILIRUBIN TOTAL: 0.5 mg/dL (ref 0.2–1.2)
TOTAL PROTEIN: 7.3 g/dL (ref 6.0–8.3)

## 2013-10-23 LAB — BASIC METABOLIC PANEL WITH GFR
BUN: 8 mg/dL (ref 6–23)
CHLORIDE: 97 meq/L (ref 96–112)
CO2: 28 mEq/L (ref 19–32)
Calcium: 9.9 mg/dL (ref 8.4–10.5)
Creat: 0.56 mg/dL (ref 0.50–1.10)
GFR, Est African American: >89 mL/min
GFR, Est Non African American: >89 mL/min
Glucose, Bld: 124 mg/dL — ABNORMAL HIGH (ref 70–99)
POTASSIUM: 3.8 meq/L (ref 3.5–5.3)
SODIUM: 139 meq/L (ref 135–145)

## 2013-10-23 LAB — LIPID PANEL
CHOL/HDL RATIO: 3.5 ratio
CHOLESTEROL: 162 mg/dL (ref 0–200)
HDL: 46 mg/dL (ref >39)
LDL CALC: 80 mg/dL (ref 0–99)
Triglycerides: 178 mg/dL — ABNORMAL HIGH (ref <150)
VLDL: 36 mg/dL (ref 0–40)

## 2013-10-23 LAB — TSH: TSH: 2.502 u[IU]/mL (ref 0.350–4.500)

## 2013-10-23 LAB — MAGNESIUM: MAGNESIUM: 1.6 mg/dL (ref 1.5–2.5)

## 2013-10-23 LAB — INSULIN, FASTING: INSULIN FASTING, SERUM: 18 u[IU]/mL (ref 3–28)

## 2013-10-23 LAB — VITAMIN D 25 HYDROXY (VIT D DEFICIENCY, FRACTURES): VIT D 25 HYDROXY: 84 ng/mL (ref 30–89)

## 2013-11-04 ENCOUNTER — Other Ambulatory Visit: Payer: Self-pay | Admitting: Physician Assistant

## 2013-12-03 ENCOUNTER — Other Ambulatory Visit: Payer: No Typology Code available for payment source

## 2013-12-03 DIAGNOSIS — Z79899 Other long term (current) drug therapy: Secondary | ICD-10-CM

## 2013-12-03 LAB — HEPATIC FUNCTION PANEL
ALBUMIN: 4.4 g/dL (ref 3.5–5.2)
ALT: 19 U/L (ref 0–35)
AST: 15 U/L (ref 0–37)
Alkaline Phosphatase: 80 U/L (ref 39–117)
BILIRUBIN TOTAL: 0.3 mg/dL (ref 0.2–1.2)
Bilirubin, Direct: 0.1 mg/dL (ref 0.0–0.3)
Indirect Bilirubin: 0.2 mg/dL (ref 0.2–1.2)
Total Protein: 7.2 g/dL (ref 6.0–8.3)

## 2013-12-23 ENCOUNTER — Other Ambulatory Visit: Payer: Self-pay | Admitting: Internal Medicine

## 2013-12-23 ENCOUNTER — Other Ambulatory Visit: Payer: Self-pay | Admitting: Pharmacist Clinician (PhC)/ Clinical Pharmacy Specialist

## 2013-12-23 DIAGNOSIS — Z1231 Encounter for screening mammogram for malignant neoplasm of breast: Secondary | ICD-10-CM

## 2013-12-30 ENCOUNTER — Ambulatory Visit (HOSPITAL_COMMUNITY): Payer: Managed Care, Other (non HMO)

## 2014-01-05 ENCOUNTER — Other Ambulatory Visit: Payer: Self-pay | Admitting: Physician Assistant

## 2014-01-07 ENCOUNTER — Ambulatory Visit (HOSPITAL_COMMUNITY)
Admission: RE | Admit: 2014-01-07 | Discharge: 2014-01-07 | Disposition: A | Discharge status: Home / Self Care | Payer: BC Managed Care – HMO | Source: Ambulatory Visit | Attending: Internal Medicine | Admitting: Internal Medicine

## 2014-01-07 DIAGNOSIS — Z1231 Encounter for screening mammogram for malignant neoplasm of breast: Secondary | ICD-10-CM | POA: Insufficient documentation

## 2014-01-22 ENCOUNTER — Ambulatory Visit (INDEPENDENT_AMBULATORY_CARE_PROVIDER_SITE_OTHER): Payer: BC Managed Care – PPO | Admitting: Physician Assistant

## 2014-01-22 ENCOUNTER — Encounter: Payer: Self-pay | Admitting: Physician Assistant

## 2014-01-22 VITALS — BP 120/78 | HR 88 | Temp 97.9°F | Resp 16 | Wt 164.0 lb

## 2014-01-22 DIAGNOSIS — Z79899 Other long term (current) drug therapy: Secondary | ICD-10-CM

## 2014-01-22 DIAGNOSIS — I10 Essential (primary) hypertension: Secondary | ICD-10-CM

## 2014-01-22 DIAGNOSIS — G629 Polyneuropathy, unspecified: Secondary | ICD-10-CM

## 2014-01-22 DIAGNOSIS — T7840XA Allergy, unspecified, initial encounter: Secondary | ICD-10-CM

## 2014-01-22 DIAGNOSIS — E119 Type 2 diabetes mellitus without complications: Secondary | ICD-10-CM

## 2014-01-22 DIAGNOSIS — G4733 Obstructive sleep apnea (adult) (pediatric): Secondary | ICD-10-CM

## 2014-01-22 DIAGNOSIS — G589 Mononeuropathy, unspecified: Secondary | ICD-10-CM

## 2014-01-22 DIAGNOSIS — E559 Vitamin D deficiency, unspecified: Secondary | ICD-10-CM

## 2014-01-22 DIAGNOSIS — E785 Hyperlipidemia, unspecified: Secondary | ICD-10-CM

## 2014-01-22 LAB — CBC WITH DIFFERENTIAL/PLATELET
BASOS ABS: 0 10*3/uL (ref 0.0–0.1)
Basophils Relative: 0 % (ref 0–1)
EOS PCT: 2 % (ref 0–5)
Eosinophils Absolute: 0.2 10*3/uL (ref 0.0–0.7)
HEMATOCRIT: 37 % (ref 36.0–46.0)
Hemoglobin: 12.3 g/dL (ref 12.0–15.0)
LYMPHS PCT: 33 % (ref 12–46)
Lymphs Abs: 3.1 10*3/uL (ref 0.7–4.0)
MCH: 26.2 pg (ref 26.0–34.0)
MCHC: 33.2 g/dL (ref 30.0–36.0)
MCV: 78.9 fL (ref 78.0–100.0)
MONO ABS: 0.5 10*3/uL (ref 0.1–1.0)
MONOS PCT: 5 % (ref 3–12)
Neutro Abs: 5.7 10*3/uL (ref 1.7–7.7)
Neutrophils Relative %: 60 % (ref 43–77)
Platelets: 268 10*3/uL (ref 150–400)
RBC: 4.69 MIL/uL (ref 3.87–5.11)
RDW: 15.1 % (ref 11.5–15.5)
WBC: 9.5 10*3/uL (ref 4.0–10.5)

## 2014-01-22 LAB — HEMOGLOBIN A1C
HEMOGLOBIN A1C: 6.1 % — AB (ref <5.7)
Mean Plasma Glucose: 128 mg/dL — ABNORMAL HIGH (ref <117)

## 2014-01-22 NOTE — Progress Notes (Signed)
HPI 63 y.o. female  presents for 3 month follow up with hypertension, hyperlipidemia, diabetes and vitamin D. Her blood pressure has been controlled at home, today their BP is BP: 120/78 mmHg She does not workout, she keeps her 88 year old granddaughter in the afternoon but no regular exercise. She denies chest pain, shortness of breath, dizziness.  She is on cholesterol medication and denies myalgias. Her cholesterol is at goal. The cholesterol last visit was:   Lab Results  Component Value Date   CHOL 162 10/22/2013   HDL 46 10/22/2013   LDLCALC 80 10/22/2013   TRIG 178* 10/22/2013   CHOLHDL 3.5 10/22/2013   She has been working on diet and exercise for Diabetes, and denies polydipsia, polyuria and visual disturbances. Last A1C in the office was:  Lab Results  Component Value Date   HGBA1C 6.0* 10/22/2013   Occasional diarrhea/nausea with metformin.  She has DM neuropathy and states that is doing well, and she has been on Lamisil and states that her toes are doing better.  Patient is on Vitamin D supplement.   She states that her vertigo has gotten better with the increase of HCTZ to 25 mg.  Obesity- on 1/2 of phentermine- has not lost any weight + snoring, fatigue, obesity, crowded mouth  Current Medications:  Current Outpatient Prescriptions on File Prior to Visit  Medication Sig Dispense Refill  . aspirin 81 MG chewable tablet Chew by mouth daily.      . bisoprolol-hydrochlorothiazide (ZIAC) 5-6.25 MG per tablet Take 1 tablet by mouth daily.      . cetirizine (ZYRTEC) 10 MG tablet Take 10 mg by mouth daily.      . Cholecalciferol (VITAMIN D PO) Take 2,000 Int'l Units by mouth 2 (two) times daily.       . ferrous sulfate dried (SLOW FE) 160 (50 FE) MG TBCR SR tablet Take 160 mg by mouth daily.      . hydrochlorothiazide (HYDRODIURIL) 25 MG tablet Take 1 tablet (25 mg total) by mouth daily. For BP and Fluid  90 tablet  99  . Magnesium 500 MG CAPS Take 500 mg by mouth daily.      . meclizine  (ANTIVERT) 25 MG tablet Take 25 mg by mouth 3 (three) times daily as needed for dizziness.      . METFORMIN HCL ER PO Take 1,000 mg by mouth 2 (two) times daily.      . montelukast (SINGULAIR) 10 MG tablet Take 1 tablet (10 mg total) by mouth daily. For allergies  30 tablet  99  . phentermine (ADIPEX-P) 37.5 MG tablet 1/2 to 1 tablet daily for weight loss  30 tablet  3  . promethazine (PHENERGAN) 25 MG tablet TAKE 1 TABLET BY MOUTH AS NEEDED DIZZINESS  30 tablet  0  . simvastatin (ZOCOR) 20 MG tablet Take 20 mg by mouth daily.       No current facility-administered medications on file prior to visit.   Medical History:  Past Medical History  Diagnosis Date  . Hyperlipidemia   . Hypertension   . Type II or unspecified type diabetes mellitus without mention of complication, not stated as uncontrolled   . Neuropathy   . Allergy   . Iron deficiency   . Vitamin D deficiency   . Meniere's disease    Allergies:  Allergies  Allergen Reactions  . Penicillins      Review of Systems: [X]  = complains of  [ ]  = denies  General:  Fatigue [ ]  Fever [ ]  Chills [ ]  Weakness [ ]   Insomnia [ ]  Eyes: Redness [ ]  Blurred vision [ ]  Diplopia [ ]   ENT: Congestion [ ]  Sinus Pain [ ]  Post Nasal Drip [ ]  Sore Throat [ ]  Earache [ ]   Cardiac: Chest pain/pressure [ ]  SOB [ ]  Orthopnea [ ]   Palpitations [ ]   Paroxysmal nocturnal dyspnea[ ]  Claudication [ ]  Edema [ ]   Pulmonary: Cough [ ]  Wheezing[ ]   SOB [ ]   Snoring Valu.Nieves ]  GI: Nausea [ ]  Vomiting[ ]  Dysphagia[ ]  Heartburn[ ]  Abdominal pain [ ]  Constipation [ ] ; Diarrhea [ ] ; BRBPR [ ]  Melena[ ]  GU: Hematuria[ ]  Dysuria [ ]  Nocturia[ ]  Urgency [ ]   Hesitancy [ ]  Discharge [ ]  Neuro: Headaches[ ]  Vertigo[ ]  Paresthesias[ ]  Spasm [ ]  Speech changes [ ]  Incoordination [ ]   Ortho: Arthritis [ ]  Joint pain [ ]  Muscle pain [ ]  Joint swelling [ ]  Back Pain [ ]  Skin:  Rash [ ]   Pruritis [ ]  Change in skin lesion [ ]   Psych: Depression[ ]  Anxiety[ ]  Confusion [ ]   Memory loss [ ]   Heme/Lypmh: Bleeding [ ]  Bruising [ ]  Enlarged lymph nodes [ ]   Endocrine: Visual blurring [ ]  Paresthesia [ ]  Polyuria [ ]  Polydypsea [ ]    Heat/cold intolerance [ ]  Hypoglycemia [ ]   Family history- Review and unchanged Social history- Review and unchanged Physical Exam: BP 120/78  Pulse 88  Temp(Src) 97.9 F (36.6 C)  Resp 16  Wt 164 lb (74.39 kg) Wt Readings from Last 3 Encounters:  01/22/14 164 lb (74.39 kg)  10/22/13 164 lb 4.8 oz (74.526 kg)    General Appearance: Well nourished, in no apparent distress. Eyes: PERRLA, EOMs, conjunctiva no swelling or erythema Sinuses: No Frontal/maxillary tenderness ENT/Mouth: Ext aud canals clear, TMs without erythema, bulging. No erythema, swelling, or exudate on post pharynx.  Tonsils not swollen or erythematous. Hearing normal.  Neck: Supple, thyroid normal.  Respiratory: Respiratory effort normal, BS equal bilaterally without rales, rhonchi, wheezing or stridor.  Cardio: RRR with no MRGs. Brisk peripheral pulses without edema.  Abdomen: Soft, + BS.  Non tender, no guarding, rebound, hernias, masses. Lymphatics: Non tender without lymphadenopathy.  Musculoskeletal: Full ROM, 5/5 strength, normal gait.  Skin: Warm, dry without rashes, lesions, ecchymosis.  Neuro: Cranial nerves intact. No cerebellar symptoms. Sensation intact.  Psych: Awake and oriented X 3, normal affect, Insight and Judgment appropriate.   Assessment and Plan:  Hypertension: Continue medication, monitor blood pressure at home. Continue DASH diet. Cholesterol: Continue diet and exercise. Check cholesterol.  Diabetes-Continue diet and exercise. Check A1C Vitamin D Def- check level and continue medications.  Obesity- increase to 1 pill and if no weight loss will stop, follow up 1 month with food dairy ? OSA- fatigue, crowded mouth, snoring- multiple risk factors/comorbidities- check sleep study  Continue diet and meds as discussed. Further disposition  pending results of labs. Discussed med's effects and SE's.    Vicie Mutters 9:06 AM

## 2014-01-22 NOTE — Patient Instructions (Signed)
Bad carbs also include fruit juice, alcohol, and sweet tea. These are empty calories that do not signal to your brain that you are full.   Please remember the good carbs are still carbs which convert into sugar. So please measure them out no more than 1/2-1 cup of rice, oatmeal, pasta, and beans.  Veggies are however free foods! Pile them on.   I like lean protein at every meal such as chicken, [place], pork chops, cottage cheese, etc. Just do not fry these meats and please center your meal around vegetable, the meats should be a side dish.   No all fruit is created equal. Please see the list below, the fruit at the bottom is higher in sugars than the fruit at the top   Phentermine  While taking the medication we will ask that you come into the office once a month to monitor your weight, blood pressure, and heart rate. In addition we can help answer your questions about diet, exercise, and help you every step of the way with your weight loss journey. Sometime it is helpful if you bring in a food diary or use an app on your phone such as myfitnesspal to record your calorie intake, especially in the beginning.   You can start out on 1/3 to 1/2 a pill in the morning and if you are tolerating it well you can increase to one pill daily.   What is this medicine? PHENTERMINE (FEN ter meen) decreases your appetite. This medicine is intended to be used in addition to a healthy reduced calorie diet and exercise. The best results are achieved this way. This medicine is only indicated for short-term use. Eventually your weight loss may level out and the medication will no longer be needed.   How should I use this medicine? Take this medicine by mouth. Follow the directions on the prescription label. The tablets should stay in the bottle until immediately before you take your dose. Take your doses at regular intervals. Do not take your medicine more often than directed.  Overdosage: If you think you have  taken too much of this medicine contact a poison control center or emergency room at once. NOTE: This medicine is only for you. Do not share this medicine with others.  What if I miss a dose? If you miss a dose, take it as soon as you can. If it is almost time for your next dose, take only that dose. Do not take double or extra doses. Do not increase or in any way change your dose without consulting your doctor.  What should I watch for while using this medicine? Notify your physician immediately if you become short of breath while doing your normal activities. Do not take this medicine within 6 hours of bedtime. It can keep you from getting to sleep. Avoid drinks that contain caffeine and try to stick to a regular bedtime every night. Do not stand or sit up quickly, especially if you are an older patient. This reduces the risk of dizzy or fainting spells. Avoid alcoholic drinks.  What side effects may I notice from receiving this medicine? Side effects that you should report to your doctor or health care professional as soon as possible: -chest pain, palpitations -depression or severe changes in mood -increased blood pressure -irritability -nervousness or restlessness -severe dizziness -shortness of breath -problems urinating -unusual swelling of the legs -vomiting  Side effects that usually do not require medical attention (report to your doctor or health  care professional if they continue or are bothersome): -blurred vision or other eye problems -changes in sexual ability or desire -constipation or diarrhea -difficulty sleeping -dry mouth or unpleasant taste -headache -nausea This list may not describe all possible side effects. Call your doctor for medical advice about side effects. You may report side effects to FDA at 1-800-FDA-1088.  Obesity Obesity is defined as having too much total body fat and a body mass index (BMI) of 30 or more. BMI is an estimate of body fat and is  calculated from your height and weight. Obesity happens when you consume more calories than you can burn by exercising or performing daily physical tasks. Prolonged obesity can cause major illnesses or emergencies, such as:   A stroke.  Heart disease.  Diabetes.  Cancer.  Arthritis.  High blood pressure (hypertension).  High cholesterol.  Sleep apnea.  Erectile dysfunction.  Infertility problems. CAUSES   Regularly eating unhealthy foods.  Physical inactivity.  Certain disorders, such as an underactive thyroid (hypothyroidism), Cushing's syndrome, and polycystic ovarian syndrome.  Certain medicines, such as steroids, some depression medicines, and antipsychotics.  Genetics.  Lack of sleep. DIAGNOSIS  A caregiver can diagnose obesity after calculating your BMI. Obesity will be diagnosed if your BMI is 30 or higher.  There are other methods of measuring obesity levels. Some other methods include measuring your skin fold thickness, your waist circumference, and comparing your hip circumference to your waist circumference. TREATMENT  A healthy treatment program includes some or all of the following:  Long-term dietary changes.  Exercise and physical activity.  Behavioral and lifestyle changes.  Medicine only under the supervision of your caregiver. Medicines may help, but only if they are used with diet and exercise programs. An unhealthy treatment program includes:  Fasting.  Fad diets.  Supplements and drugs. These choices do not succeed in long-term weight control.  HOME CARE INSTRUCTIONS   Exercise and perform physical activity as directed by your caregiver. To increase physical activity, try the following:  Use stairs instead of elevators.  Park farther away from store entrances.  Garden, bike, or walk instead of watching television or using the computer.  Eat healthy, low-calorie foods and drinks on a regular basis. Eat more fruits and vegetables.  Use low-calorie cookbooks or take healthy cooking classes.  Limit fast food, sweets, and processed snack foods.  Eat smaller portions.  Keep a daily journal of everything you eat. There are many free websites to help you with this. It may be helpful to measure your foods so you can determine if you are eating the correct portion sizes.  Avoid drinking alcohol. Drink more water and drinks without calories.  Take vitamins and supplements only as recommended by your caregiver.  Weight-loss support groups, Nurse, mental health, counselors, and stress reduction education can also be very helpful. SEEK IMMEDIATE MEDICAL CARE IF:  You have chest pain or tightness.  You have trouble breathing or feel short of breath.  You have weakness or leg numbness.  You feel confused or have trouble talking.  You have sudden changes in your vision. MAKE SURE YOU:  Understand these instructions.  Will watch your condition.  Will get help right away if you are not doing well or get worse. Document Released: 10/13/2004 Document Revised: 03/06/2012 Document Reviewed: 10/12/2011 [facility] Patient Information 2014 [place].

## 2014-01-23 LAB — INSULIN, FASTING: Insulin fasting, serum: 33 u[IU]/mL — ABNORMAL HIGH (ref 3–28)

## 2014-01-23 LAB — BASIC METABOLIC PANEL WITH GFR
BUN: 10 mg/dL (ref 6–23)
CHLORIDE: 98 meq/L (ref 96–112)
CO2: 29 meq/L (ref 19–32)
Calcium: 9.5 mg/dL (ref 8.4–10.5)
Creat: 0.56 mg/dL (ref 0.50–1.10)
GFR, Est African American: >89 mL/min
GLUCOSE: 122 mg/dL — AB (ref 70–99)
Potassium: 3.6 mEq/L (ref 3.5–5.3)
Sodium: 138 mEq/L (ref 135–145)

## 2014-01-23 LAB — LIPID PANEL
Cholesterol: 150 mg/dL (ref 0–200)
HDL: 47 mg/dL (ref >39)
LDL CALC: 67 mg/dL (ref 0–99)
Total CHOL/HDL Ratio: 3.2 Ratio
Triglycerides: 181 mg/dL — ABNORMAL HIGH (ref <150)
VLDL: 36 mg/dL (ref 0–40)

## 2014-01-23 LAB — HEPATIC FUNCTION PANEL
ALT: 17 U/L (ref 0–35)
AST: 17 U/L (ref 0–37)
Albumin: 4.1 g/dL (ref 3.5–5.2)
Alkaline Phosphatase: 81 U/L (ref 39–117)
BILIRUBIN INDIRECT: 0.3 mg/dL (ref 0.2–1.2)
Bilirubin, Direct: 0.1 mg/dL (ref 0.0–0.3)
TOTAL PROTEIN: 7 g/dL (ref 6.0–8.3)
Total Bilirubin: 0.4 mg/dL (ref 0.2–1.2)

## 2014-01-23 LAB — TSH: TSH: 2.072 u[IU]/mL (ref 0.350–4.500)

## 2014-01-23 LAB — MAGNESIUM: Magnesium: 1.6 mg/dL (ref 1.5–2.5)

## 2014-01-23 LAB — VITAMIN D 25 HYDROXY (VIT D DEFICIENCY, FRACTURES): Vit D, 25-Hydroxy: 74 ng/mL (ref 30–89)

## 2014-01-26 ENCOUNTER — Other Ambulatory Visit: Payer: Self-pay | Admitting: Internal Medicine

## 2014-01-27 ENCOUNTER — Other Ambulatory Visit: Payer: Self-pay

## 2014-01-27 MED ORDER — BAYER CONTOUR MONITOR DEVI: Status: DC

## 2014-01-27 MED ORDER — BAYER MICROLET LANCETS MISC: Status: AC

## 2014-01-27 MED ORDER — GLUCOSE BLOOD VI STRP: ORAL_STRIP | Status: DC

## 2014-02-19 ENCOUNTER — Encounter: Payer: Self-pay | Admitting: Physician Assistant

## 2014-02-19 ENCOUNTER — Ambulatory Visit (INDEPENDENT_AMBULATORY_CARE_PROVIDER_SITE_OTHER): Payer: BC Managed Care – PPO | Admitting: Physician Assistant

## 2014-02-19 VITALS — BP 120/68 | HR 80 | Temp 97.9°F | Resp 16 | Wt 161.0 lb

## 2014-02-19 DIAGNOSIS — G4733 Obstructive sleep apnea (adult) (pediatric): Secondary | ICD-10-CM

## 2014-02-19 DIAGNOSIS — E119 Type 2 diabetes mellitus without complications: Secondary | ICD-10-CM

## 2014-02-19 MED ORDER — PHENTERMINE HCL 37.5 MG PO TABS: ORAL_TABLET | ORAL | Status: DC

## 2014-02-19 NOTE — Progress Notes (Signed)
63 y.o.female presents for a follow up after being on phentermine for weight loss for 2 months. Patient states they have improved meal pattern, less frequent dining out and fewer sweetened foods & beverages. While on the phentermine they have lost 3 lbs since last visit. They deny palpitations, anxiety, trouble sleeping, elevated BP. We went over her food diary, I have suggested that she decrease carbs in the morning (a lot of pancakes) and increase veggies. Explained veggies are free foods and she should have them at every meal and as snacks if possible.   Wt Readings from Last 3 Encounters:  02/19/14 161 lb (73.029 kg)  01/22/14 164 lb (74.39 kg)  10/22/13 164 lb 4.8 oz (74.526 kg)   Medications: Current Outpatient Prescriptions on File Prior to Visit  Medication Sig Dispense Refill  . aspirin 81 MG chewable tablet Chew by mouth daily.      Marland Kitchen BAYER MICROLET LANCETS lancets Use as instructed  100 each  12  . bisoprolol-hydrochlorothiazide (ZIAC) 5-6.25 MG per tablet Take 1 tablet by mouth daily.      . Blood Glucose Monitoring Suppl (CONTOUR BLOOD GLUCOSE SYSTEM) DEVI Test Blood sugar once daily  1 Device  0  . cetirizine (ZYRTEC) 10 MG tablet Take 10 mg by mouth daily.      . Cholecalciferol (VITAMIN D PO) Take 2,000 Int'l Units by mouth 2 (two) times daily.       . ferrous sulfate dried (SLOW FE) 160 (50 FE) MG TBCR SR tablet Take 160 mg by mouth daily.      Marland Kitchen glucose blood (BAYER CONTOUR TEST) test strip Use as instructed  100 each  12  . hydrochlorothiazide (HYDRODIURIL) 25 MG tablet Take 1 tablet (25 mg total) by mouth daily. For BP and Fluid  90 tablet  99  . Magnesium 500 MG CAPS Take 500 mg by mouth daily.      . meclizine (ANTIVERT) 25 MG tablet Take 25 mg by mouth 3 (three) times daily as needed for dizziness.      . METFORMIN HCL ER PO Take 1,000 mg by mouth 2 (two) times daily.      . montelukast (SINGULAIR) 10 MG tablet Take 1 tablet (10 mg total) by mouth daily. For allergies  30  tablet  99  . phentermine (ADIPEX-P) 37.5 MG tablet 1/2 to 1 tablet daily for weight loss  30 tablet  3  . promethazine (PHENERGAN) 25 MG tablet TAKE 1 TABLET BY MOUTH AS NEEDED DIZZINESS  30 tablet  0  . simvastatin (ZOCOR) 20 MG tablet Take 20 mg by mouth daily.       No current facility-administered medications on file prior to visit.    ROS: All negative except for above  Physical exam: Filed Vitals:   02/19/14 0953  BP: 120/68  Pulse: 80  Temp: 97.9 F (36.6 C)  Resp: 16   BP 120/68  Pulse 80  Temp(Src) 97.9 F (36.6 C)  Resp 16  Wt 161 lb (73.029 kg) General appearance: alert Lungs: clear to auscultation bilaterally Heart: regular rate and rhythm, S1, S2 normal, no murmur, click, rub or gallop Abdomen: soft, non-tender; bowel sounds normal; no masses,  no organomegaly  Assessment: Obesity with co morbid conditions.  OSA  Plan: General weight loss/lifestyle modification strategies discussed (elicit support from others; identify saboteurs; non-food rewards, etc). Diet interventions: diet diary indefinitely. Informal exercise measures discussed, e.g. taking stairs instead of elevator. Regular aerobic exercise program discussed. Medication: phentermine. Follow up  in: 2 months and as needed. Sleep study next thursday

## 2014-02-19 NOTE — Patient Instructions (Signed)
Bad carbs also include fruit juice, alcohol, and sweet tea. These are empty calories that do not signal to your brain that you are full.   Please remember the good carbs are still carbs which convert into sugar. So please measure them out no more than 1/2-1 cup of rice, oatmeal, pasta, and beans.  Veggies are however free foods! Pile them on.   I like lean protein at every meal such as chicken, [place], pork chops, cottage cheese, etc. Just do not fry these meats and please center your meal around vegetable, the meats should be a side dish.   No all fruit is created equal. Please see the list below, the fruit at the bottom is higher in sugars than the fruit at the top   Obesity Obesity is defined as having too much total body fat and a body mass index (BMI) of 30 or more. BMI is an estimate of body fat and is calculated from your height and weight. Obesity happens when you consume more calories than you can burn by exercising or performing daily physical tasks. Prolonged obesity can cause major illnesses or emergencies, such as:   A stroke.  Heart disease.  Diabetes.  Cancer.  Arthritis.  High blood pressure (hypertension).  High cholesterol.  Sleep apnea.  Erectile dysfunction.  Infertility problems. CAUSES   Regularly eating unhealthy foods.  Physical inactivity.  Certain disorders, such as an underactive thyroid (hypothyroidism), Cushing's syndrome, and polycystic ovarian syndrome.  Certain medicines, such as steroids, some depression medicines, and antipsychotics.  Genetics.  Lack of sleep. DIAGNOSIS  A caregiver can diagnose obesity after calculating your BMI. Obesity will be diagnosed if your BMI is 30 or higher.  There are other methods of measuring obesity levels. Some other methods include measuring your skin fold thickness, your waist circumference, and comparing your hip circumference to your waist circumference. TREATMENT  A healthy treatment program  includes some or all of the following:  Long-term dietary changes.  Exercise and physical activity.  Behavioral and lifestyle changes.  Medicine only under the supervision of your caregiver. Medicines may help, but only if they are used with diet and exercise programs. An unhealthy treatment program includes:  Fasting.  Fad diets.  Supplements and drugs. These choices do not succeed in long-term weight control.  HOME CARE INSTRUCTIONS   Exercise and perform physical activity as directed by your caregiver. To increase physical activity, try the following:  Use stairs instead of elevators.  Park farther away from store entrances.  Garden, bike, or walk instead of watching television or using the computer.  Eat healthy, low-calorie foods and drinks on a regular basis. Eat more fruits and vegetables. Use low-calorie cookbooks or take healthy cooking classes.  Limit fast food, sweets, and processed snack foods.  Eat smaller portions.  Keep a daily journal of everything you eat. There are many free websites to help you with this. It may be helpful to measure your foods so you can determine if you are eating the correct portion sizes.  Avoid drinking alcohol. Drink more water and drinks without calories.  Take vitamins and supplements only as recommended by your caregiver.  Weight-loss support groups, Nurse, mental health, counselors, and stress reduction education can also be very helpful. SEEK IMMEDIATE MEDICAL CARE IF:  You have chest pain or tightness.  You have trouble breathing or feel short of breath.  You have weakness or leg numbness.  You feel confused or have trouble talking.  You have sudden changes  in your vision. MAKE SURE YOU:  Understand these instructions.  Will watch your condition.  Will get help right away if you are not doing well or get worse. Document Released: 10/13/2004 Document Revised: 03/06/2012 Document Reviewed: 10/12/2011 [employer]  Patient Information 2014 [employer].  

## 2014-03-04 ENCOUNTER — Ambulatory Visit: Payer: Self-pay | Admitting: Physician Assistant

## 2014-03-28 ENCOUNTER — Other Ambulatory Visit: Payer: Self-pay | Admitting: Physician Assistant

## 2014-04-14 ENCOUNTER — Encounter: Payer: Self-pay | Admitting: Internal Medicine

## 2014-04-14 ENCOUNTER — Ambulatory Visit (INDEPENDENT_AMBULATORY_CARE_PROVIDER_SITE_OTHER): Payer: BC Managed Care – PPO | Admitting: Internal Medicine

## 2014-04-14 VITALS — BP 112/78 | HR 88 | Temp 97.7°F | Resp 16 | Ht 60.0 in | Wt 158.2 lb

## 2014-04-14 DIAGNOSIS — Z111 Encounter for screening for respiratory tuberculosis: Secondary | ICD-10-CM

## 2014-04-14 DIAGNOSIS — R740 Nonspecific elevation of levels of transaminase and lactic acid dehydrogenase [LDH]: Secondary | ICD-10-CM

## 2014-04-14 DIAGNOSIS — Z113 Encounter for screening for infections with a predominantly sexual mode of transmission: Secondary | ICD-10-CM

## 2014-04-14 DIAGNOSIS — I10 Essential (primary) hypertension: Secondary | ICD-10-CM

## 2014-04-14 DIAGNOSIS — Z1212 Encounter for screening for malignant neoplasm of rectum: Secondary | ICD-10-CM

## 2014-04-14 DIAGNOSIS — R7402 Elevation of levels of lactic acid dehydrogenase (LDH): Secondary | ICD-10-CM

## 2014-04-14 DIAGNOSIS — R7401 Elevation of levels of liver transaminase levels: Secondary | ICD-10-CM

## 2014-04-14 DIAGNOSIS — Z0000 Encounter for general adult medical examination without abnormal findings: Secondary | ICD-10-CM

## 2014-04-14 DIAGNOSIS — Z79899 Other long term (current) drug therapy: Secondary | ICD-10-CM

## 2014-04-14 DIAGNOSIS — E559 Vitamin D deficiency, unspecified: Secondary | ICD-10-CM

## 2014-04-14 LAB — CBC WITH DIFFERENTIAL/PLATELET
Basophils Absolute: 0 10*3/uL (ref 0.0–0.1)
Basophils Relative: 0 % (ref 0–1)
EOS ABS: 0.3 10*3/uL (ref 0.0–0.7)
Eosinophils Relative: 3 % (ref 0–5)
HCT: 36.5 % (ref 36.0–46.0)
Hemoglobin: 12.6 g/dL (ref 12.0–15.0)
LYMPHS ABS: 3.5 10*3/uL (ref 0.7–4.0)
Lymphocytes Relative: 31 % (ref 12–46)
MCH: 26.4 pg (ref 26.0–34.0)
MCHC: 34.5 g/dL (ref 30.0–36.0)
MCV: 76.4 fL — AB (ref 78.0–100.0)
MONOS PCT: 5 % (ref 3–12)
Monocytes Absolute: 0.6 10*3/uL (ref 0.1–1.0)
Neutro Abs: 6.9 10*3/uL (ref 1.7–7.7)
Neutrophils Relative %: 61 % (ref 43–77)
PLATELETS: 324 10*3/uL (ref 150–400)
RBC: 4.78 MIL/uL (ref 3.87–5.11)
RDW: 15.1 % (ref 11.5–15.5)
WBC: 11.3 10*3/uL — ABNORMAL HIGH (ref 4.0–10.5)

## 2014-04-14 NOTE — Patient Instructions (Signed)

## 2014-04-14 NOTE — Progress Notes (Signed)
Patient ID: Regina Cervantes, female   DOB: 05-17-1951, 63 y.o.   MRN: [id number]   Annual Screening Comprehensive Examination  This very nice 63 y.o.MWF presents for complete physical.  Patient has been followed for HTN,  T2_NIDDM, Hyperlipidemia, and Vitamin D Deficiency.    HTN predates since 2004. Patient's BP has been controlled at home. Today's BP: 112/78 mmHg. Patient denies any cardiac symptoms as chest pain, palpitations, shortness of breath, dizziness or ankle swelling.   Patient's hyperlipidemia is controlled with diet and medications. Patient denies myalgias or other medication SE's. Last lipids were were at goal . Lab Results  Component Value Date   CHOL 150 01/22/2014   HDL 47 01/22/2014   LDLCALC 67 01/22/2014   TRIG 181* 01/22/2014   CHOLHDL 3.2 01/22/2014    Patient has Morbid Obesity (BMI 38.9) and consequent T2_NIDDM since 2004 and last A1c was 6.1% in May 2015. Patient has lost 26 # over the last year since starting on Phentermine.Patient denies reactive hypoglycemic symptoms, visual blurring, diabetic polys, or paresthesias.   Finally, patient has history of Vitamin D Deficiency and last Vitamin D was 80 in May 2015.    Medication Sig  . aspirin 81 MG  tablet Chew by mouth daily.  Marland Kitchen BAYER MICROLET LANCETS lancets Use as instructed  . bisoprolol-hctz (5-6.25 MG  Take 1 tablet by mouth daily.  . Blood Glucose Monitoring Suppl (CONTOUR BLOOD GLUCOSE SYSTEM) DEVI Test Blood sugar once daily  . cetirizine 10 MG tablet Take 10 mg by mouth daily.  Marland Kitchen VITAMIN D Take 2,000 Int'l Units by mouth 2 times daily.   Marland Kitchen SLOW FE 160 (50 FE) MG SR tablet Take 160 mg by mouth daily.  Marland Kitchen glucose blood (BAYER CONTOUR TEST) test strip Use as instructed  . hctz  25 MG tablet Take 1 tablet  by mouth daily. For BP and Fluid  . Magnesium 500 MG CAPS Take 500 mg by mouth daily.  . meclizine25 MG tablet Take 25 mg  3  times daily as needed for dizziness.  . METFORMIN HCL ER PO Take 1,000 mg by mouth 2   times daily.  . montelukast  10 MG tablet Take 1 tablet by mouth daily. For allergies  . phentermine37.5 MG tablet 1/2 to 1 tablet daily for weight loss  . promethazine 25 MG tablet TAKE 1 TABLET  AS NEEDED DIZZINESS  . simvastatin 20 MG tablet Take 20 mg by mouth daily.   Allergies  Allergen Reactions  . Penicillins    Past Medical History  Diagnosis Date  . Hyperlipidemia   . Hypertension   . Type II or unspecified type diabetes mellitus without mention of complication, not stated as uncontrolled   . Neuropathy   . Allergy   . Iron deficiency   . Vitamin D deficiency   . Meniere's disease    Past Surgical History  Procedure Laterality Date  . Orif ankle fracture Left 2000  . Abdominal hysterectomy  1998    BSO   Family History  Problem Relation Age of Onset  . Cancer Mother     PANCREAS  . Hypertension Father   . Heart disease Father   . Diabetes Father   . Heart disease Son    History  Substance Use Topics  . Smoking status: Never Smoker   . Smokeless tobacco: Not on file  . Alcohol Use: Yes     Comment: rarely    ROS Constitutional: Denies fever, chills, weight loss/gain, headaches, insomnia,  fatigue, night sweats, and change in appetite. Eyes: Denies redness, blurred vision, diplopia, discharge, itchy, watery eyes.  ENT: Denies discharge, congestion, post nasal drip, epistaxis, sore throat, earache, hearing loss, dental pain, Tinnitus, Vertigo, Sinus pain, snoring.  Cardio: Denies chest pain, palpitations, irregular heartbeat, syncope, dyspnea, diaphoresis, orthopnea, PND, claudication, edema Respiratory: denies cough, dyspnea, DOE, pleurisy, hoarseness, laryngitis, wheezing.  Gastrointestinal: Denies dysphagia, heartburn, reflux, water brash, pain, cramps, nausea, vomiting, bloating, diarrhea, constipation, hematemesis, melena, hematochezia, jaundice, hemorrhoids Genitourinary: Denies dysuria, frequency, urgency, nocturia, hesitancy, discharge, hematuria, flank  pain Breast: Breast lumps, nipple discharge, bleeding.  Musculoskeletal: Denies arthralgia, myalgia, stiffness, Jt. Swelling, pain, limp, and strain/sprain. Denies falls. Skin: Denies puritis, rash, hives, warts, acne, eczema, changing in skin lesion Neuro: No weakness, tremor, incoordination, spasms, paresthesia, pain Psychiatric: Denies confusion, memory loss, sensory loss. Denies Depression. Endocrine: Denies change in weight, skin, hair change, nocturia, and paresthesia, diabetic polys, visual blurring, hyper / hypo glycemic episodes.  Heme/Lymph: No excessive bleeding, bruising, enlarged lymph nodes.  Physical Exam  BP 112/78  Pulse 88  Temp(Src) 97.7 F (36.5 C) (Temporal)  Resp 16  Ht 5' (1.524 m)  Wt 158 lb 3.2 oz (71.759 kg)  BMI 30.90 kg/m2  General Appearance: Well nourished and in no apparent distress. Eyes: PERRLA, EOMs, conjunctiva no swelling or erythema, normal fundi and vessels. Sinuses: No frontal/maxillary tenderness ENT/Mouth: EACs patent / TMs  nl. Nares clear without erythema, swelling, mucoid exudates. Oral hygiene is good. No erythema, swelling, or exudate. Tongue normal, non-obstructing. Tonsils not swollen or erythematous. Hearing normal.  Neck: Supple, thyroid normal. No bruits, nodes or JVD. Respiratory: Respiratory effort normal.  BS equal and clear bilateral without rales, rhonci, wheezing or stridor. Cardio: Heart sounds are normal with regular rate and rhythm and no murmurs, rubs or gallops. Peripheral pulses are normal and equal bilaterally without edema. No aortic or femoral bruits. Chest: symmetric with normal excursions and percussion. Breasts: Symmetric, without lumps, nipple discharge, retractions, or fibrocystic changes.  Abdomen: Flat, soft, with bowl sounds. Nontender, no guarding, rebound, hernias, masses, or organomegaly.  Lymphatics: Non tender without lymphadenopathy.  Genitourinary:  Musculoskeletal: Full ROM all peripheral extremities,  joint stability, 5/5 strength, and normal gait. Skin: Warm and dry without rashes, lesions, cyanosis, clubbing or  ecchymosis.  Neuro: Cranial nerves intact, reflexes equal bilaterally. Normal muscle tone, no cerebellar symptoms. Sensation intact.  Pysch: Awake and oriented X 3, normal affect, Insight and Judgment appropriate.   Assessment and Plan  1. Annual Screening Examination 2. Hypertension  3. Hyperlipidemia 4. T2_NIDDM 5. Vitamin D Deficiency  Continue prudent diet as discussed, weight control, BP monitoring, regular exercise, and medications. Discussed med's effects and SE's. Screening labs and tests as requested with regular follow-up as recommended.

## 2014-04-15 LAB — HEPATITIS B CORE ANTIBODY, TOTAL: Hep B Core Total Ab: NONREACTIVE

## 2014-04-15 LAB — LIPID PANEL
Cholesterol: 155 mg/dL (ref 0–200)
HDL: 42 mg/dL (ref >39)
LDL Cholesterol: 74 mg/dL (ref 0–99)
Total CHOL/HDL Ratio: 3.7 Ratio
Triglycerides: 193 mg/dL — ABNORMAL HIGH (ref <150)
VLDL: 39 mg/dL (ref 0–40)

## 2014-04-15 LAB — HEPATIC FUNCTION PANEL
ALBUMIN: 4.4 g/dL (ref 3.5–5.2)
ALT: 18 U/L (ref 0–35)
AST: 17 U/L (ref 0–37)
Alkaline Phosphatase: 79 U/L (ref 39–117)
Bilirubin, Direct: 0.1 mg/dL (ref 0.0–0.3)
Indirect Bilirubin: 0.3 mg/dL (ref 0.2–1.2)
TOTAL PROTEIN: 7.3 g/dL (ref 6.0–8.3)
Total Bilirubin: 0.4 mg/dL (ref 0.2–1.2)

## 2014-04-15 LAB — HIV ANTIBODY (ROUTINE TESTING W REFLEX): HIV 1&2 Ab, 4th Generation: NONREACTIVE

## 2014-04-15 LAB — URINALYSIS, MICROSCOPIC ONLY
Bacteria, UA: NONE SEEN
CASTS: NONE SEEN
CRYSTALS: NONE SEEN

## 2014-04-15 LAB — VITAMIN B12: Vitamin B-12: 373 pg/mL (ref 211–911)

## 2014-04-15 LAB — BASIC METABOLIC PANEL WITH GFR
BUN: 11 mg/dL (ref 6–23)
CALCIUM: 10.2 mg/dL (ref 8.4–10.5)
CO2: 30 meq/L (ref 19–32)
Chloride: 95 mEq/L — ABNORMAL LOW (ref 96–112)
Creat: 0.71 mg/dL (ref 0.50–1.10)
GFR, Est African American: >89 mL/min
GFR, Est Non African American: >89 mL/min
GLUCOSE: 130 mg/dL — AB (ref 70–99)
Potassium: 3.4 mEq/L — ABNORMAL LOW (ref 3.5–5.3)
Sodium: 139 mEq/L (ref 135–145)

## 2014-04-15 LAB — HEMOGLOBIN A1C
HEMOGLOBIN A1C: 6.1 % — AB (ref <5.7)
Mean Plasma Glucose: 128 mg/dL — ABNORMAL HIGH (ref <117)

## 2014-04-15 LAB — MICROALBUMIN / CREATININE URINE RATIO
CREATININE, URINE: 136.6 mg/dL
Microalb Creat Ratio: 7.2 mg/g (ref 0.0–30.0)
Microalb, Ur: 0.99 mg/dL (ref 0.00–1.89)

## 2014-04-15 LAB — HEPATITIS B SURFACE ANTIBODY,QUALITATIVE: HEP B S AB: NEGATIVE

## 2014-04-15 LAB — HEPATITIS A ANTIBODY, TOTAL: HEP A TOTAL AB: NONREACTIVE

## 2014-04-15 LAB — RPR

## 2014-04-15 LAB — TSH: TSH: 2.256 u[IU]/mL (ref 0.350–4.500)

## 2014-04-15 LAB — MAGNESIUM: MAGNESIUM: 1.6 mg/dL (ref 1.5–2.5)

## 2014-04-15 LAB — INSULIN, FASTING: INSULIN FASTING, SERUM: 123 u[IU]/mL — AB (ref 3–28)

## 2014-04-15 LAB — VITAMIN D 25 HYDROXY (VIT D DEFICIENCY, FRACTURES): Vit D, 25-Hydroxy: 100 ng/mL — ABNORMAL HIGH (ref 30–89)

## 2014-04-15 LAB — HEPATITIS C ANTIBODY: HCV AB: NEGATIVE

## 2014-04-16 LAB — HEPATITIS B E ANTIBODY: Hepatitis Be Antibody: NONREACTIVE

## 2014-04-17 LAB — TB SKIN TEST
Induration: 0 mm
TB Skin Test: NEGATIVE

## 2014-04-27 ENCOUNTER — Ambulatory Visit (HOSPITAL_BASED_OUTPATIENT_CLINIC_OR_DEPARTMENT_OTHER): Payer: BC Managed Care – HMO | Attending: Internal Medicine

## 2014-04-27 VITALS — Ht 60.0 in | Wt 158.0 lb

## 2014-04-27 DIAGNOSIS — R0989 Other specified symptoms and signs involving the circulatory and respiratory systems: Secondary | ICD-10-CM | POA: Insufficient documentation

## 2014-04-27 DIAGNOSIS — G4730 Sleep apnea, unspecified: Secondary | ICD-10-CM | POA: Diagnosis present

## 2014-04-27 DIAGNOSIS — G478 Other sleep disorders: Secondary | ICD-10-CM | POA: Insufficient documentation

## 2014-04-27 DIAGNOSIS — R0609 Other forms of dyspnea: Secondary | ICD-10-CM | POA: Diagnosis not present

## 2014-04-27 DIAGNOSIS — G4710 Hypersomnia, unspecified: Secondary | ICD-10-CM | POA: Insufficient documentation

## 2014-04-27 DIAGNOSIS — G4700 Insomnia, unspecified: Secondary | ICD-10-CM

## 2014-04-30 ENCOUNTER — Other Ambulatory Visit: Payer: Self-pay | Admitting: *Deleted

## 2014-04-30 DIAGNOSIS — Z1212 Encounter for screening for malignant neoplasm of rectum: Secondary | ICD-10-CM

## 2014-04-30 LAB — POC HEMOCCULT BLD/STL (HOME/3-CARD/SCREEN)
Card #2 Fecal Occult Blod, POC: NEGATIVE
Card #3 Fecal Occult Blood, POC: NEGATIVE
Fecal Occult Blood, POC: NEGATIVE

## 2014-05-03 DIAGNOSIS — G4700 Insomnia, unspecified: Secondary | ICD-10-CM

## 2014-05-03 NOTE — Sleep Study (Signed)
   NAME: Regina Cervantes DATE OF BIRTH:  12/29/1950 MEDICAL RECORD NUMBER [id number]  LOCATION: [facility]  PHYSICIAN: Mavryk Pino D  DATE OF STUDY: 04/27/2014  SLEEP STUDY TYPE: Nocturnal Polysomnogram               REFERRING PHYSICIAN: [name], MD  INDICATION FOR STUDY: Hypersomnia with sleep apnea  EPWORTH SLEEPINESS SCORE:   10/24 HEIGHT: 5' (152.4 cm)  WEIGHT: 158 lb (71.668 kg)    Body mass index is 30.86 kg/(m^2).  NECK SIZE: 14 in.  MEDICATIONS: Charted for review  SLEEP ARCHITECTURE: Total sleep time 150 minutes with sleep efficiency 40.1%. Stage I was 31.7%, stage II 68.3%, stage III and REM were absent. Sleep latency 32 minutes, awake after sleep onset 191.5 minutes, arousal index 24, bedtime medication: Montelukast, simvastatin, Zyrtec  RESPIRATORY DATA: Apnea hypopneas index (AHI) 4.4 per hour. 11 total events scored, all as hypopneas while supine. There were not enough events for split protocol CPAP titration.  OXYGEN DATA: Moderately loud snoring with oxygen desaturation to a nadir of 87% and mean saturation of 93.2% on room air.  CARDIAC DATA: Sinus rhythm and PVCs  MOVEMENT/PARASOMNIA: No significant movement disturbance, bathroom x1  IMPRESSION/ RECOMMENDATION:   1) Mark difficulty initiating and maintaining sleep with fragmentation and frequent wakening throughout the night. She said the unfamiliar environment contributed to her difficulty falling asleep. If this is her pattern at home, management as insomnia may be helpful. 2) Occasional respiratory events with sleep disturbance, within normal limits, AHI 4.4 per hour. The normal range for adults is an AHI of 0-5 events per hour. Moderately loud snoring with oxygen desaturation to a nadir of 87% and mean saturation of 93.2% on room air.   [name] Diplomate, American Board of Sleep Medicine  ELECTRONICALLY SIGNED ON:  05/03/2014, 11:27 AM [place] PH: [phone]   FX: [phone] [place]

## 2014-05-20 ENCOUNTER — Ambulatory Visit: Payer: Self-pay | Admitting: Physician Assistant

## 2014-05-22 ENCOUNTER — Encounter: Payer: Self-pay | Admitting: Gastroenterology

## 2014-05-22 ENCOUNTER — Telehealth: Payer: Self-pay | Admitting: *Deleted

## 2014-05-22 NOTE — Telephone Encounter (Signed)
Pt aware of lab results.  Patient instructed to call Dr Charlott Rakes to schedule an appointment to be fitted for an oral appliance to prevent snoring.

## 2014-06-25 ENCOUNTER — Other Ambulatory Visit: Payer: Self-pay | Admitting: Internal Medicine

## 2014-07-24 ENCOUNTER — Ambulatory Visit (INDEPENDENT_AMBULATORY_CARE_PROVIDER_SITE_OTHER): Payer: BC Managed Care – PPO | Admitting: Physician Assistant

## 2014-07-24 ENCOUNTER — Encounter: Payer: Self-pay | Admitting: Physician Assistant

## 2014-07-24 VITALS — BP 120/70 | HR 88 | Temp 97.5°F | Resp 16 | Ht 61.0 in | Wt 156.0 lb

## 2014-07-24 DIAGNOSIS — G629 Polyneuropathy, unspecified: Secondary | ICD-10-CM | POA: Diagnosis not present

## 2014-07-24 DIAGNOSIS — Z23 Encounter for immunization: Secondary | ICD-10-CM

## 2014-07-24 DIAGNOSIS — Z79899 Other long term (current) drug therapy: Secondary | ICD-10-CM

## 2014-07-24 DIAGNOSIS — I10 Essential (primary) hypertension: Secondary | ICD-10-CM | POA: Diagnosis not present

## 2014-07-24 DIAGNOSIS — E559 Vitamin D deficiency, unspecified: Secondary | ICD-10-CM | POA: Diagnosis not present

## 2014-07-24 DIAGNOSIS — E785 Hyperlipidemia, unspecified: Secondary | ICD-10-CM

## 2014-07-24 DIAGNOSIS — E669 Obesity, unspecified: Secondary | ICD-10-CM

## 2014-07-24 DIAGNOSIS — E1142 Type 2 diabetes mellitus with diabetic polyneuropathy: Secondary | ICD-10-CM

## 2014-07-24 LAB — CBC WITH DIFFERENTIAL/PLATELET
BASOS ABS: 0 10*3/uL (ref 0.0–0.1)
Basophils Relative: 0 % (ref 0–1)
EOS ABS: 0.2 10*3/uL (ref 0.0–0.7)
EOS PCT: 2 % (ref 0–5)
HCT: 37.8 % (ref 36.0–46.0)
Hemoglobin: 12.6 g/dL (ref 12.0–15.0)
Lymphocytes Relative: 32 % (ref 12–46)
Lymphs Abs: 3.3 10*3/uL (ref 0.7–4.0)
MCH: 25.9 pg — AB (ref 26.0–34.0)
MCHC: 33.3 g/dL (ref 30.0–36.0)
MCV: 77.6 fL — AB (ref 78.0–100.0)
Monocytes Absolute: 0.5 10*3/uL (ref 0.1–1.0)
Monocytes Relative: 5 % (ref 3–12)
NEUTROS PCT: 61 % (ref 43–77)
Neutro Abs: 6.3 10*3/uL (ref 1.7–7.7)
Platelets: 331 10*3/uL (ref 150–400)
RBC: 4.87 MIL/uL (ref 3.87–5.11)
RDW: 14.8 % (ref 11.5–15.5)
WBC: 10.3 10*3/uL (ref 4.0–10.5)

## 2014-07-24 MED ORDER — PHENTERMINE HCL 37.5 MG PO TABS: ORAL_TABLET | ORAL | Status: DC

## 2014-07-24 NOTE — Progress Notes (Signed)
Assessment and Plan:  Hypertension: Continue medication, monitor blood pressure at home. Continue DASH diet.  Reminder to go to the ER if any CP, SOB, nausea, dizziness, severe HA, changes vision/speech, left arm numbness and tingling and jaw pain. Cholesterol: Continue diet and exercise. Check cholesterol.  Diabetes with p. neuropathy-Continue diet and exercise. Check A1C Vitamin D Def- check level and continue medications.  Obesity with co morbidities- long discussion about weight loss, diet, and exercise, will refill phentermine for 3 more months and then she needs to taper off.   Continue diet and meds as discussed. Further disposition pending results of labs. Discussed med's effects and SE's.   HPI 63 y.o. female  presents for 3 month follow up with hypertension, hyperlipidemia, diabetes and vitamin D. Her blood pressure has been controlled at home, today their BP is BP: 120/70 mmHg She does workout. She denies chest pain, shortness of breath, dizziness.  She is not on cholesterol medication and denies myalgias. Her cholesterol is at goal. The cholesterol was:  04/14/2014: Cholesterol, Total 155; HDL Cholesterol by NMR 42; LDL (calc) 74; Triglycerides 193* She has been working on diet and exercise for Diabetes, she does have neuropathy from her DM, she is on MF, she is on bASA, and denies polydipsia, polyuria and visual disturbances. Last A1C  was: 04/14/2014: Hemoglobin-A1c 6.1* Patient is on Vitamin D supplement. 04/14/2014: Vit D, 25-Hydroxy 100*  She was at the beach and fell up the stairs and hurt her right ankle, she has seen ortho for this, no fx.  BMI is Body mass index is 29.49 kg/(m^2)., she is working on diet and exercise and has done well, she is on phentermine and has been on it for a year.  Wt Readings from Last 3 Encounters:  07/24/14 156 lb (70.761 kg)  04/28/14 158 lb (71.668 kg)  04/14/14 158 lb 3.2 oz (71.759 kg)    Current Medications:  Current Outpatient Prescriptions  on File Prior to Visit  Medication Sig Dispense Refill  . aspirin 81 MG chewable tablet Chew by mouth daily.    Marland Kitchen BAYER MICROLET LANCETS lancets Use as instructed 100 each 12  . bisoprolol-hydrochlorothiazide (ZIAC) 5-6.25 MG per tablet TAKE 1 TABLET BY MOUTH EVERY DAY 90 tablet 99  . Blood Glucose Monitoring Suppl (CONTOUR BLOOD GLUCOSE SYSTEM) DEVI Test Blood sugar once daily 1 Device 0  . cetirizine (ZYRTEC) 10 MG tablet Take 10 mg by mouth daily.    . Cholecalciferol (VITAMIN D PO) Take 2,000 Int'l Units by mouth 2 (two) times daily.     . ferrous sulfate dried (SLOW FE) 160 (50 FE) MG TBCR SR tablet Take 160 mg by mouth daily.    Marland Kitchen glucose blood (BAYER CONTOUR TEST) test strip Use as instructed 100 each 12  . hydrochlorothiazide (HYDRODIURIL) 25 MG tablet Take 1 tablet (25 mg total) by mouth daily. For BP and Fluid 90 tablet 99  . Magnesium 500 MG CAPS Take 500 mg by mouth daily.    . meclizine (ANTIVERT) 25 MG tablet Take 25 mg by mouth 3 (three) times daily as needed for dizziness.    . METFORMIN HCL ER PO Take 1,000 mg by mouth 2 (two) times daily.    . montelukast (SINGULAIR) 10 MG tablet Take 1 tablet (10 mg total) by mouth daily. For allergies 30 tablet 99  . OVER THE COUNTER MEDICATION One Source supplement 2 times daily for hair,skin and nails    . phentermine (ADIPEX-P) 37.5 MG tablet 1/2 to  1 tablet daily for weight loss 30 tablet 2  . promethazine (PHENERGAN) 25 MG tablet TAKE 1 TABLET BY MOUTH AS NEEDED DIZZINESS 30 tablet 0  . simvastatin (ZOCOR) 20 MG tablet TAKE 1 TABLET AT BEDTIME FOR CHOLESTEROL 30 tablet 12   No current facility-administered medications on file prior to visit.   Medical History:  Past Medical History  Diagnosis Date  . Hyperlipidemia   . Hypertension   . Type II or unspecified type diabetes mellitus without mention of complication, not stated as uncontrolled   . Neuropathy   . Allergy   . Iron deficiency   . Vitamin D deficiency   . Meniere's  disease    Allergies:  Allergies  Allergen Reactions  . Penicillins      Review of Systems: [X]  = complains of  [ ]  = denies  General: Fatigue [ ]  Fever [ ]  Chills [ ]  Weakness [ ]   Insomnia [ ]  Eyes: Redness [ ]  Blurred vision [ ]  Diplopia [ ]   ENT: Congestion [ ]  Sinus Pain [ ]  Post Nasal Drip [ ]  Sore Throat [ ]  Earache [ ]   Cardiac: Chest pain/pressure [ ]  SOB [ ]  Orthopnea [ ]   Palpitations [ ]   Paroxysmal nocturnal dyspnea[ ]  Claudication [ ]  Edema [ ]   Pulmonary: Cough [ ]  Wheezing[ ]   SOB [ ]   Snoring [ ]   GI: Nausea [ ]  Vomiting[ ]  Dysphagia[ ]  Heartburn[ ]  Abdominal pain [ ]  Constipation [ ] ; Diarrhea [ ] ; BRBPR [ ]  Melena[ ]  GU: Hematuria[ ]  Dysuria [ ]  Nocturia[ ]  Urgency [ ]   Hesitancy [ ]  Discharge [ ]  Neuro: Headaches[ ]  Vertigo[ ]  Paresthesias[ ]  Spasm [ ]  Speech changes [ ]  Incoordination [ ]   Ortho: Arthritis [ ]  Joint pain [ ]  Muscle pain [ ]  Joint swelling [ ]  Back Pain [ ]  Skin:  Rash [ ]   Pruritis [ ]  Change in skin lesion [ ]   Psych: Depression[ ]  Anxiety[ ]  Confusion [ ]  Memory loss [ ]   Heme/Lypmh: Bleeding [ ]  Bruising [ ]  Enlarged lymph nodes [ ]   Endocrine: Visual blurring [ ]  Paresthesia [ ]  Polyuria [ ]  Polydypsea [ ]    Heat/cold intolerance [ ]  Hypoglycemia [ ]   Family history- Review and unchanged Social history- Review and unchanged Physical Exam: BP 120/70 mmHg  Pulse 88  Temp(Src) 97.5 F (36.4 C)  Resp 16  Ht 5\' 1"  (1.549 m)  Wt 156 lb (70.761 kg)  BMI 29.49 kg/m2 Wt Readings from Last 3 Encounters:  07/24/14 156 lb (70.761 kg)  04/28/14 158 lb (71.668 kg)  04/14/14 158 lb 3.2 oz (71.759 kg)   General Appearance: Well nourished, in no apparent distress. Eyes: PERRLA, EOMs, conjunctiva no swelling or erythema Sinuses: No Frontal/maxillary tenderness ENT/Mouth: Ext aud canals clear, TMs without erythema, bulging. No erythema, swelling, or exudate on post pharynx.  Tonsils not swollen or erythematous. Hearing normal.  Neck: Supple, thyroid  normal.  Respiratory: Respiratory effort normal, BS equal bilaterally without rales, rhonchi, wheezing or stridor.  Cardio: RRR with no MRGs. Brisk peripheral pulses without edema.  Abdomen: Soft, + BS.  Non tender, no guarding, rebound, hernias, masses. Lymphatics: Non tender without lymphadenopathy.  Musculoskeletal: Full ROM, 5/5 strength, normal gait.  Skin: Warm, dry without rashes, lesions, ecchymosis.  Neuro: Cranial nerves intact. No cerebellar symptoms. Sensation intact.  Psych: Awake and oriented X 3, normal affect, Insight and Judgment appropriate.    Vicie Mutters, PA-C 10:06 AM [facility]

## 2014-07-24 NOTE — Patient Instructions (Signed)
Recommendations For Diabetic/Prediabetic Patients:   -  Take medications as prescribed  -  Recommend Dr Joel Fuhrman's book "The End of Diabetes "  And "The End of Dieting"- Can get at  www.Amazon.com and encourage also get the Audio CD book  - AVOID Animal products, ie. Meat - red/white, Poultry and Dairy/especially cheese - Exercise at least 5 times a week for 30 minutes or preferably daily.  - No Smoking - Drink less than 2 drinks a day.  - Monitor your feet for sores - Have yearly Eye Exams - Recommend annual Flu vaccine  - Recommend Pneumovax and Prevnar vaccines - Shingles Vaccine (Zostavax) if over 60 y.o.  Goals:   - BMI less than 24 - Fasting sugar less than 130 or less than 150 if tapering medicines to lose weight  - Systolic BP less than 130  - Diastolic BP less than 80 - Bad LDL Cholesterol less than 70 - Triglycerides less than 150 

## 2014-07-25 LAB — HEMOGLOBIN A1C
Hgb A1c MFr Bld: 6.3 % — ABNORMAL HIGH (ref <5.7)
Mean Plasma Glucose: 134 mg/dL — ABNORMAL HIGH (ref <117)

## 2014-07-25 LAB — LIPID PANEL
Cholesterol: 134 mg/dL (ref 0–200)
HDL: 45 mg/dL (ref >39)
LDL Cholesterol: 50 mg/dL (ref 0–99)
TRIGLYCERIDES: 195 mg/dL — AB (ref <150)
Total CHOL/HDL Ratio: 3 Ratio
VLDL: 39 mg/dL (ref 0–40)

## 2014-07-25 LAB — MAGNESIUM: Magnesium: 1.6 mg/dL (ref 1.5–2.5)

## 2014-07-25 LAB — HEPATIC FUNCTION PANEL
ALT: 12 U/L (ref 0–35)
AST: 16 U/L (ref 0–37)
Albumin: 4.4 g/dL (ref 3.5–5.2)
Alkaline Phosphatase: 85 U/L (ref 39–117)
BILIRUBIN TOTAL: 0.4 mg/dL (ref 0.2–1.2)
Bilirubin, Direct: 0.1 mg/dL (ref 0.0–0.3)
Indirect Bilirubin: 0.3 mg/dL (ref 0.2–1.2)
Total Protein: 7 g/dL (ref 6.0–8.3)

## 2014-07-25 LAB — BASIC METABOLIC PANEL WITH GFR
BUN: 9 mg/dL (ref 6–23)
CALCIUM: 10.1 mg/dL (ref 8.4–10.5)
CO2: 30 mEq/L (ref 19–32)
CREATININE: 0.57 mg/dL (ref 0.50–1.10)
Chloride: 95 mEq/L — ABNORMAL LOW (ref 96–112)
GFR, Est African American: >89 mL/min
Glucose, Bld: 107 mg/dL — ABNORMAL HIGH (ref 70–99)
Potassium: 3.9 mEq/L (ref 3.5–5.3)
SODIUM: 137 meq/L (ref 135–145)

## 2014-07-25 LAB — INSULIN, FASTING: INSULIN FASTING, SERUM: 11.1 u[IU]/mL (ref 2.0–19.6)

## 2014-07-25 LAB — TSH: TSH: 2.797 u[IU]/mL (ref 0.350–4.500)

## 2014-09-15 ENCOUNTER — Other Ambulatory Visit: Payer: Self-pay | Admitting: Emergency Medicine

## 2014-10-31 ENCOUNTER — Ambulatory Visit (INDEPENDENT_AMBULATORY_CARE_PROVIDER_SITE_OTHER): Payer: Medicare Other | Admitting: Internal Medicine

## 2014-10-31 ENCOUNTER — Encounter: Payer: Self-pay | Admitting: Internal Medicine

## 2014-10-31 VITALS — BP 126/72 | HR 80 | Temp 97.7°F | Resp 16 | Ht 60.0 in | Wt 150.6 lb

## 2014-10-31 DIAGNOSIS — E119 Type 2 diabetes mellitus without complications: Secondary | ICD-10-CM

## 2014-10-31 DIAGNOSIS — Z0001 Encounter for general adult medical examination with abnormal findings: Secondary | ICD-10-CM | POA: Diagnosis not present

## 2014-10-31 DIAGNOSIS — I10 Essential (primary) hypertension: Secondary | ICD-10-CM

## 2014-10-31 DIAGNOSIS — E663 Overweight: Secondary | ICD-10-CM | POA: Insufficient documentation

## 2014-10-31 DIAGNOSIS — E785 Hyperlipidemia, unspecified: Secondary | ICD-10-CM | POA: Diagnosis not present

## 2014-10-31 DIAGNOSIS — Z23 Encounter for immunization: Secondary | ICD-10-CM | POA: Diagnosis not present

## 2014-10-31 DIAGNOSIS — E559 Vitamin D deficiency, unspecified: Secondary | ICD-10-CM

## 2014-10-31 DIAGNOSIS — Z1331 Encounter for screening for depression: Secondary | ICD-10-CM

## 2014-10-31 DIAGNOSIS — E611 Iron deficiency: Secondary | ICD-10-CM

## 2014-10-31 DIAGNOSIS — Z79899 Other long term (current) drug therapy: Secondary | ICD-10-CM

## 2014-10-31 DIAGNOSIS — E669 Obesity, unspecified: Secondary | ICD-10-CM

## 2014-10-31 DIAGNOSIS — R6889 Other general symptoms and signs: Secondary | ICD-10-CM | POA: Diagnosis not present

## 2014-10-31 DIAGNOSIS — Z9181 History of falling: Secondary | ICD-10-CM

## 2014-10-31 DIAGNOSIS — Z0000 Encounter for general adult medical examination without abnormal findings: Secondary | ICD-10-CM

## 2014-10-31 LAB — CBC WITH DIFFERENTIAL/PLATELET
BASOS ABS: 0 10*3/uL (ref 0.0–0.1)
Basophils Relative: 0 % (ref 0–1)
Eosinophils Absolute: 0.5 10*3/uL (ref 0.0–0.7)
Eosinophils Relative: 5 % (ref 0–5)
HCT: 37.4 % (ref 36.0–46.0)
HEMOGLOBIN: 12.1 g/dL (ref 12.0–15.0)
LYMPHS PCT: 33 % (ref 12–46)
Lymphs Abs: 3.1 10*3/uL (ref 0.7–4.0)
MCH: 26 pg (ref 26.0–34.0)
MCHC: 32.4 g/dL (ref 30.0–36.0)
MCV: 80.3 fL (ref 78.0–100.0)
MONOS PCT: 6 % (ref 3–12)
MPV: 9.7 fL (ref 8.6–12.4)
Monocytes Absolute: 0.6 10*3/uL (ref 0.1–1.0)
NEUTROS ABS: 5.3 10*3/uL (ref 1.7–7.7)
Neutrophils Relative %: 56 % (ref 43–77)
Platelets: 282 10*3/uL (ref 150–400)
RBC: 4.66 MIL/uL (ref 3.87–5.11)
RDW: 14.7 % (ref 11.5–15.5)
WBC: 9.4 10*3/uL (ref 4.0–10.5)

## 2014-10-31 MED ORDER — PHENTERMINE HCL 37.5 MG PO TABS: ORAL_TABLET | ORAL | Status: DC

## 2014-10-31 NOTE — Patient Instructions (Signed)

## 2014-10-31 NOTE — Progress Notes (Signed)
Patient ID: Regina Cervantes, female   DOB: [birth year], 64 y.o.   MRN: [id number]  MEDICARE ANNUAL WELLNESS VISIT AND OV  Assessment:   1. Essential hypertension  - TSH  2. Hyperlipidemia  - Lipid panel  3. Morbid obesity  - Hemoglobin A1c - Insulin, fasting  4. Diabetes mellitus type II, controlled  5. Vitamin D deficiency  - Vit D  25 hydroxy (rtn osteoporosis monitoring)  6. Iron deficiency  7. Obesity  - phentermine (ADIPEX-P) 37.5 MG tablet; 1/2 to 1 tablet daily for weight loss  Dispense: 30 tablet; Refill: 5  8. Need for prophylactic vaccination against Streptococcus pneumoniae (pneumococcus) - Pneumococcal conjugate vaccine 13-valent  9. Routine general medical examination at a health care facility  10. Medication management  - CBC with Differential/Platelet - BASIC METABOLIC PANEL WITH GFR - Hepatic function panel - Magnesium   Plan:   During the course of the visit the patient was educated and counseled about appropriate screening and preventive services including:    Pneumococcal vaccine   Influenza vaccine  Td vaccine  Screening electrocardiogram  Bone densitometry screening  Colorectal cancer screening  Diabetes screening  Glaucoma screening  Nutrition counseling   Advanced directives: requested  Screening recommendations, referrals: Vaccinations:  Immunization History  Administered Date(s) Administered  . Influenza Split 07/16/2013, 07/24/2014  . PPD Test 04/14/2014  . Pneumococcal Conjugate-13 10/31/2014  . Pneumococcal Polysaccharide-23 12/01/2004  . Tdap 04/06/2011  . Zoster 07/16/2013   Hep B vaccine not indicated  Nutrition assessed and recommended  Colonoscopy 03/27/2009 Recommended yearly ophthalmology/optometry visit for glaucoma screening and checkup Recommended yearly dental visit for hygiene and checkup Advanced directives - no / offered forms to pt  Conditions/risks identified: BMI: Discussed weight loss,  diet, and increase physical activity.  Increase physical activity: AHA recommends 150 minutes of physical activity a week.  Medications reviewed Diabetes is not at goal, ACE/ARB therapy: No Urinary Incontinence is not an issue: discussed non pharmacology and pharmacology options.  Fall risk: low- discussed PT, home fall assessment, medications.   Subjective:   Regina Cervantes is a 64 y.o. MWF who presents for Medicare Annual Wellness Visit and OV.  Date of last medicare wellness visit is unknown.  She has had elevated blood pressure since 2004. Her blood pressure has been controlled at home, & today her BP is BP: 126/72 mmHg She does not workout. She denies chest pain, shortness of breath, dizziness.  She is on cholesterol medication and denies myalgias. Her cholesterol is at goal. The cholesterol last visit was:  Lab Results  Component Value Date   CHOL 134 07/24/2014   HDL 45 07/24/2014   LDLCALC 50 07/24/2014   TRIG 195* 07/24/2014   CHOLHDL 3.0 07/24/2014   She has had diabetes for 12 years (2004). She has been working on diet and exercise for diabetes, and denies foot ulcerations, hyperglycemia, hypoglycemia , polydipsia, polyuria and visual disturbances. She does have paresthesias of the soles of her feet. Last A1C in the office was:  Lab Results  Component Value Date   HGBA1C 6.3* 07/24/2014   Patient is on Vitamin D supplement.   Lab Results  Component Value Date   VD25OH 100* 04/14/2014     Names of Other Physician/Practitioners you currently use: 1. [facility] here for primary care 2. Dr Melida Gimenez, OD , eye doctor, last visit Jan 2015 3. None recent, dentist, last visit 2010 - referred to Dr Augustina Mood, DDS Patient had Sleep Study  r/o'd OSA , but had loud snoring & poor sleep hygiene, but Pt deferred referral to Dr Toy Cookey for oral appliance.  Patient Care Team: [name], MD as PCP - General (Internal  Medicine) Inda Castle, MD as Consulting Physician (Gastroenterology) Rozetta Nunnery, MD as Consulting Physician (Otolaryngology)  Medication Review: Medication Sig  . aspirin 81 MG chewable tablet Chew by mouth daily.  Marland Kitchen BAYER MICROLET LANCETS lancets Use as instructed  . bisoprolol-hydrochlorothiazide (ZIAC) 5-6.25 MG per tablet TAKE 1 TABLET BY MOUTH EVERY DAY  . Blood Glucose Monitoring Suppl (CONTOUR BLOOD GLUCOSE SYSTEM) DEVI Test Blood sugar once daily  . cetirizine (ZYRTEC) 10 MG tablet Take 10 mg by mouth daily.  . Cholecalciferol (VITAMIN D PO) Take 2,000 Int'l Units by mouth 2 (two) times daily.   . ferrous sulfate dried (SLOW FE) 160 (50 FE) MG TBCR SR tablet Take 160 mg by mouth daily.  Marland Kitchen glucose blood (BAYER CONTOUR TEST) test strip Use as instructed  . hydrochlorothiazide (HYDRODIURIL) 25 MG tablet Take 1 tablet (25 mg total) by mouth daily. For BP and Fluid  . Magnesium 500 MG CAPS Take 500 mg by mouth daily.  . meclizine (ANTIVERT) 25 MG tablet Take 25 mg by mouth 3 (three) times daily as needed for dizziness.  . METFORMIN HCL ER PO Take 1,000 mg by mouth 2 (two) times daily.  . montelukast (SINGULAIR) 10 MG tablet Take 1 tablet (10 mg total) by mouth daily. For allergies  . OVER THE COUNTER MEDICATION One Source supplement 2 times daily for hair,skin and nails  . phentermine (ADIPEX-P) 37.5 MG tablet 1/2 to 1 tablet daily for weight loss  . promethazine (PHENERGAN) 25 MG tablet TAKE 1 TABLET BY MOUTH AS NEEDED DIZZINESS  . simvastatin (ZOCOR) 20 MG tablet TAKE 1 TABLET AT BEDTIME FOR CHOLESTEROL   Current Problems (verified) Patient Active Problem List   Diagnosis Date Noted  . Morbid obesity 10/31/2014  . Medication management 10/21/2013  . Hyperlipidemia   . Hypertension   . T2_NIDDM   . Neuropathy   . Iron deficiency   . Vitamin D deficiency   . Meniere's disease    Screening Tests Health Maintenance  Topic Date Due  . FOOT EXAM  08/04/1959  .  OPHTHALMOLOGY EXAM  08/04/1959  . DEXA SCAN  08/03/2014  . HEMOGLOBIN A1C  01/22/2015  . URINE MICROALBUMIN  04/15/2015  . INFLUENZA VACCINE  04/20/2015  . MAMMOGRAM  01/08/2016  . COLONOSCOPY  03/28/2019  . TETANUS/TDAP  04/05/2021  . PNEUMOCOCCAL POLYSACCHARIDE VACCINE AGE 58 AND OVER  Completed  . ZOSTAVAX  Completed   Immunization History  Administered Date(s) Administered  . Influenza Split 07/16/2013, 07/24/2014  . PPD Test 04/14/2014  . Pneumococcal Conjugate-13 10/31/2014  . Pneumococcal Polysaccharide-23 12/01/2004  . Tdap 04/06/2011  . Zoster 07/16/2013   Preventative care: Last colonoscopy: 03/27/2009  History reviewed: allergies, current medications, past family history, past medical history, past social history, past surgical history and problem list  Risk Factors: Tobacco History  Substance Use Topics  . Smoking status: Never Smoker   . Smokeless tobacco: Not on file  . Alcohol Use: Yes     Comment: rarely   She does smoke.  Patient is not a former smoker. Are there smokers in your home (other than you)?  No Alcohol Current alcohol use: rarely  Caffeine Current caffeine use: coffee 1 cup /day  Exercise Current exercise: walking  Nutrition/Diet Current diet: in general, a "healthy" diet  Cardiac risk factors: advanced age (older than 8 for men, 49 for women), hypertension, sedentary lifestyle and smoking/ tobacco exposure.  Depression Screen (Note: if answer to either of the following is "Yes", a more complete depression screening is indicated)   Q1: Over the past two weeks, have you felt down, depressed or hopeless? No  Q2: Over the past two weeks, have you felt little interest or pleasure in doing things? No  Have you lost interest or pleasure in daily life? No  Do you often feel hopeless? No  Do you cry easily over simple problems? No  Activities of Daily Living In your present state of health, do you have any difficulty performing the  following activities?:  Driving? No Managing money?  No Feeding yourself? No Getting from bed to chair? No Climbing a flight of stairs? No Preparing food and eating?: No Bathing or showering? No Getting dressed: No Getting to the toilet? No Using the toilet:No Moving around from place to place: No In the past year have you fallen or had a near fall?:No   Are you sexually active?  No  Do you have more than one partner?  No  Vision Difficulties: No  Hearing Difficulties: No Do you often ask people to speak up or repeat themselves? No Do you experience ringing or noises in your ears? No Do you have difficulty understanding soft or whispered voices? Sometimes.  Cognition  Do you feel that you have a problem with memory?No  Do you often misplace items? No  Do you feel safe at home?  Yes  Advanced directives Does patient have a [place]? No Does patient have a Living Will? No - offered forms to patient  Past Medical History  Diagnosis Date  . Hyperlipidemia   . Hypertension   . Type II or unspecified type diabetes mellitus without mention of complication, not stated as uncontrolled   . Neuropathy   . Allergy   . Iron deficiency   . Vitamin D deficiency   . Meniere's disease    Past Surgical History  Procedure Laterality Date  . Orif ankle fracture Left 2000  . Abdominal hysterectomy  1998    BSO   ROS: Constitutional: Denies fever, chills, weight loss/gain, headaches, insomnia, fatigue, night sweats, and change in appetite. Eyes: Denies redness, blurred vision, diplopia, discharge, itchy, watery eyes.  ENT: Denies discharge, congestion, post nasal drip, epistaxis, sore throat, earache, hearing loss, dental pain, Tinnitus, Vertigo, Sinus pain, snoring.  Cardio: Denies chest pain, palpitations, irregular heartbeat, syncope, dyspnea, diaphoresis, orthopnea, PND, claudication, edema Respiratory: denies cough, dyspnea, DOE, pleurisy, hoarseness,  laryngitis, wheezing.  Gastrointestinal: Denies dysphagia, heartburn, reflux, water brash, pain, cramps, nausea, vomiting, bloating, diarrhea, constipation, hematemesis, melena, hematochezia, jaundice, hemorrhoids Genitourinary: Denies dysuria, frequency, urgency, nocturia, hesitancy, discharge, hematuria, flank pain Breast: Breast lumps, nipple discharge, bleeding.  Musculoskeletal: Denies arthralgia, myalgia, stiffness, Jt. Swelling, pain, limp, and strain/sprain. Denies falls. Skin: Denies puritis, rash, hives, warts, acne, eczema, changing in skin lesion Neuro: No weakness, tremor, incoordination, spasms, paresthesia, pain Psychiatric: Denies confusion, memory loss, sensory loss. Denies Depression. Endocrine: Denies change in weight, skin, hair change, nocturia, and paresthesia, diabetic polys, visual blurring, hyper / hypo glycemic episodes.  Heme/Lymph: No excessive bleeding, bruising, enlarged lymph nodes  Objective:     BP 126/72   Pulse 80  Temp 97.7 F   Resp 16  Ht 5'   Wt 150 lb 9.6 oz     BMI 29.41   General  Appearance: Well nourished, alert, WD/WN, femaleand in no apparent distress. Eyes: PERRLA, EOMs, conjunctiva no swelling or erythema, normal fundi and vessels. Sinuses: No frontal/maxillary tenderness ENT/Mouth: EACs patent / TMs  nl. Nares clear without erythema, swelling, mucoid exudates. Oral hygiene is good. No erythema, swelling, or exudate. Tongue normal, non-obstructing. Tonsils not swollen or erythematous. Hearing normal.  Neck: Supple, thyroid normal. No bruits, nodes or JVD. Respiratory: Respiratory effort normal.  BS equal and clear bilateral without rales, rhonci, wheezing or stridor. Cardio: Heart sounds are normal with regular rate and rhythm and no murmurs, rubs or gallops. Peripheral pulses are normal and equal bilaterally without edema. No aortic or femoral bruits. Chest: symmetric with normal excursions and percussion. Abdomen: Flat, soft  with nl bowel  sounds. Nontender, no guarding, rebound, hernias, masses, or organomegaly.  Lymphatics: Non tender without lymphadenopathy.  Genitourinary:  Musculoskeletal: Full ROM all peripheral extremities, joint stability, 5/5 strength, and normal gait. Skin: Warm and dry without rashes, lesions, cyanosis, clubbing or  ecchymosis.  Neuro: Cranial nerves intact, reflexes equal bilaterally. Normal muscle tone, no cerebellar symptoms. Sensation intact.  Pysch: Awake and oriented X 3, normal affect, Insight and Judgment appropriate.   Cognitive Testing  Alert? Yes  Normal Appearance?Yes  Oriented to person? Yes  Place? Yes   Time? Yes  Recall of three objects?  Yes  Can perform simple calculations? Yes  Displays appropriate judgment? Yes  Can read the correct time from a watch/clock?Yes  Medicare Attestation I have personally reviewed: The patient's medical and social history Their use of alcohol, tobacco or illicit drugs Their current medications and supplements The patient's functional ability including ADLs,fall risks, home safety risks, cognitive, and hearing and visual impairment Diet and physical activities Evidence for depression or mood disorders  The patient's weight, height, BMI, and visual acuity have been recorded in the chart.  I have made referrals, counseling, and provided education to the patient based on review of the above and I have provided the patient with a written personalized care plan for preventive services.    Jaceyon Strole DAVID, MD   10/31/2014

## 2014-10-31 NOTE — Addendum Note (Signed)
Addended by: [name] on: 10/31/2014 05:14 PM   Modules accepted: Level of Service

## 2014-11-01 LAB — LIPID PANEL
CHOL/HDL RATIO: 3.7 ratio
CHOLESTEROL: 161 mg/dL (ref 0–200)
HDL: 43 mg/dL (ref >39)
LDL Cholesterol: 79 mg/dL (ref 0–99)
Triglycerides: 193 mg/dL — ABNORMAL HIGH (ref <150)
VLDL: 39 mg/dL (ref 0–40)

## 2014-11-01 LAB — BASIC METABOLIC PANEL WITH GFR
BUN: 10 mg/dL (ref 6–23)
CALCIUM: 9.5 mg/dL (ref 8.4–10.5)
CO2: 29 meq/L (ref 19–32)
CREATININE: 0.5 mg/dL (ref 0.50–1.10)
Chloride: 98 mEq/L (ref 96–112)
GFR, Est African American: >89 mL/min
GFR, Est Non African American: >89 mL/min
GLUCOSE: 131 mg/dL — AB (ref 70–99)
Potassium: 3.5 mEq/L (ref 3.5–5.3)
Sodium: 138 mEq/L (ref 135–145)

## 2014-11-01 LAB — TSH: TSH: 2.178 u[IU]/mL (ref 0.350–4.500)

## 2014-11-01 LAB — HEPATIC FUNCTION PANEL
ALT: 13 U/L (ref 0–35)
AST: 15 U/L (ref 0–37)
Albumin: 4.1 g/dL (ref 3.5–5.2)
Alkaline Phosphatase: 76 U/L (ref 39–117)
BILIRUBIN DIRECT: 0.1 mg/dL (ref 0.0–0.3)
Indirect Bilirubin: 0.4 mg/dL (ref 0.2–1.2)
TOTAL PROTEIN: 7 g/dL (ref 6.0–8.3)
Total Bilirubin: 0.5 mg/dL (ref 0.2–1.2)

## 2014-11-01 LAB — INSULIN, FASTING: Insulin fasting, serum: 12.9 u[IU]/mL (ref 2.0–19.6)

## 2014-11-01 LAB — MAGNESIUM: Magnesium: 1.6 mg/dL (ref 1.5–2.5)

## 2014-11-01 LAB — VITAMIN D 25 HYDROXY (VIT D DEFICIENCY, FRACTURES): VIT D 25 HYDROXY: 61 ng/mL (ref 30–100)

## 2014-11-01 LAB — HEMOGLOBIN A1C
HEMOGLOBIN A1C: 5.9 % — AB (ref <5.7)
Mean Plasma Glucose: 123 mg/dL — ABNORMAL HIGH (ref <117)

## 2014-11-05 ENCOUNTER — Telehealth: Payer: Self-pay | Admitting: *Deleted

## 2014-11-05 ENCOUNTER — Other Ambulatory Visit: Payer: Self-pay | Admitting: Internal Medicine

## 2014-11-05 NOTE — Telephone Encounter (Signed)
Patient called with a question about her Metformin.  Per Dr Melford Aase, continue with Metformin 500 mg ER 2 tabs=1000 mg twice daily.

## 2014-11-25 ENCOUNTER — Other Ambulatory Visit: Payer: Self-pay | Admitting: *Deleted

## 2014-11-25 MED ORDER — METFORMIN HCL ER 500 MG PO TB24: ORAL_TABLET | ORAL | Status: DC

## 2014-11-28 ENCOUNTER — Other Ambulatory Visit: Payer: Self-pay | Admitting: Internal Medicine

## 2014-12-09 ENCOUNTER — Other Ambulatory Visit: Payer: Self-pay | Admitting: *Deleted

## 2014-12-09 MED ORDER — BISOPROLOL-HYDROCHLOROTHIAZIDE 5-6.25 MG PO TABS: 1.0000 | ORAL_TABLET | Freq: Every day | ORAL | Status: DC

## 2014-12-09 MED ORDER — METFORMIN HCL ER 500 MG PO TB24: ORAL_TABLET | ORAL | Status: DC

## 2014-12-09 MED ORDER — MONTELUKAST SODIUM 10 MG PO TABS: ORAL_TABLET | ORAL | Status: DC

## 2014-12-09 MED ORDER — SIMVASTATIN 20 MG PO TABS: ORAL_TABLET | ORAL | Status: DC

## 2014-12-11 ENCOUNTER — Other Ambulatory Visit: Payer: Self-pay | Admitting: *Deleted

## 2014-12-11 MED ORDER — SIMVASTATIN 20 MG PO TABS: ORAL_TABLET | ORAL | Status: DC

## 2014-12-11 MED ORDER — METFORMIN HCL ER 500 MG PO TB24: ORAL_TABLET | ORAL | Status: DC

## 2014-12-11 MED ORDER — MONTELUKAST SODIUM 10 MG PO TABS: ORAL_TABLET | ORAL | Status: DC

## 2014-12-11 MED ORDER — BISOPROLOL-HYDROCHLOROTHIAZIDE 5-6.25 MG PO TABS: 1.0000 | ORAL_TABLET | Freq: Every day | ORAL | Status: DC

## 2014-12-16 ENCOUNTER — Other Ambulatory Visit: Payer: Self-pay | Admitting: Internal Medicine

## 2014-12-23 ENCOUNTER — Other Ambulatory Visit: Payer: Self-pay | Admitting: Internal Medicine

## 2014-12-23 DIAGNOSIS — E119 Type 2 diabetes mellitus without complications: Secondary | ICD-10-CM

## 2014-12-23 DIAGNOSIS — E782 Mixed hyperlipidemia: Secondary | ICD-10-CM

## 2014-12-23 DIAGNOSIS — I10 Essential (primary) hypertension: Secondary | ICD-10-CM

## 2014-12-23 DIAGNOSIS — J309 Allergic rhinitis, unspecified: Secondary | ICD-10-CM

## 2014-12-23 MED ORDER — BISOPROLOL-HYDROCHLOROTHIAZIDE 5-6.25 MG PO TABS: 1.0000 | ORAL_TABLET | Freq: Every day | ORAL | Status: DC

## 2014-12-23 MED ORDER — SIMVASTATIN 20 MG PO TABS: ORAL_TABLET | ORAL | Status: DC

## 2014-12-23 MED ORDER — MONTELUKAST SODIUM 10 MG PO TABS: ORAL_TABLET | ORAL | Status: DC

## 2014-12-23 MED ORDER — METFORMIN HCL ER 500 MG PO TB24: ORAL_TABLET | ORAL | Status: DC

## 2014-12-24 ENCOUNTER — Other Ambulatory Visit: Payer: Self-pay | Admitting: *Deleted

## 2014-12-24 MED ORDER — HYDROCHLOROTHIAZIDE 25 MG PO TABS: ORAL_TABLET | ORAL | Status: DC

## 2014-12-27 ENCOUNTER — Other Ambulatory Visit: Payer: Self-pay | Admitting: Internal Medicine

## 2014-12-27 DIAGNOSIS — I10 Essential (primary) hypertension: Secondary | ICD-10-CM

## 2014-12-27 DIAGNOSIS — R609 Edema, unspecified: Secondary | ICD-10-CM

## 2014-12-27 MED ORDER — HYDROCHLOROTHIAZIDE 25 MG PO TABS: ORAL_TABLET | ORAL | Status: DC

## 2015-01-07 ENCOUNTER — Other Ambulatory Visit: Payer: Self-pay | Admitting: *Deleted

## 2015-01-07 DIAGNOSIS — R609 Edema, unspecified: Secondary | ICD-10-CM

## 2015-01-07 DIAGNOSIS — I10 Essential (primary) hypertension: Secondary | ICD-10-CM

## 2015-01-07 MED ORDER — HYDROCHLOROTHIAZIDE 25 MG PO TABS: ORAL_TABLET | ORAL | Status: DC

## 2015-01-21 ENCOUNTER — Other Ambulatory Visit: Payer: Self-pay | Admitting: Internal Medicine

## 2015-01-21 DIAGNOSIS — Z1231 Encounter for screening mammogram for malignant neoplasm of breast: Secondary | ICD-10-CM

## 2015-01-29 ENCOUNTER — Ambulatory Visit (HOSPITAL_COMMUNITY)
Admission: RE | Admit: 2015-01-29 | Discharge: 2015-01-29 | Disposition: A | Discharge status: Home / Self Care | Payer: Medicare Other | Source: Ambulatory Visit | Attending: Internal Medicine | Admitting: Internal Medicine

## 2015-01-29 DIAGNOSIS — Z1231 Encounter for screening mammogram for malignant neoplasm of breast: Secondary | ICD-10-CM

## 2015-02-06 ENCOUNTER — Ambulatory Visit (INDEPENDENT_AMBULATORY_CARE_PROVIDER_SITE_OTHER): Payer: Medicare Other | Admitting: Physician Assistant

## 2015-02-06 ENCOUNTER — Encounter: Payer: Self-pay | Admitting: Physician Assistant

## 2015-02-06 VITALS — BP 132/80 | HR 80 | Temp 97.7°F | Resp 16 | Wt 157.0 lb

## 2015-02-06 DIAGNOSIS — G629 Polyneuropathy, unspecified: Secondary | ICD-10-CM

## 2015-02-06 DIAGNOSIS — E782 Mixed hyperlipidemia: Secondary | ICD-10-CM | POA: Diagnosis not present

## 2015-02-06 DIAGNOSIS — E559 Vitamin D deficiency, unspecified: Secondary | ICD-10-CM | POA: Diagnosis not present

## 2015-02-06 DIAGNOSIS — G5601 Carpal tunnel syndrome, right upper limb: Secondary | ICD-10-CM

## 2015-02-06 DIAGNOSIS — E1142 Type 2 diabetes mellitus with diabetic polyneuropathy: Secondary | ICD-10-CM | POA: Diagnosis not present

## 2015-02-06 DIAGNOSIS — E1143 Type 2 diabetes mellitus with diabetic autonomic (poly)neuropathy: Secondary | ICD-10-CM

## 2015-02-06 DIAGNOSIS — I10 Essential (primary) hypertension: Secondary | ICD-10-CM | POA: Diagnosis not present

## 2015-02-06 DIAGNOSIS — E669 Obesity, unspecified: Secondary | ICD-10-CM

## 2015-02-06 DIAGNOSIS — E785 Hyperlipidemia, unspecified: Secondary | ICD-10-CM

## 2015-02-06 DIAGNOSIS — Z79899 Other long term (current) drug therapy: Secondary | ICD-10-CM | POA: Diagnosis not present

## 2015-02-06 DIAGNOSIS — R7309 Other abnormal glucose: Secondary | ICD-10-CM | POA: Diagnosis not present

## 2015-02-06 LAB — HEPATIC FUNCTION PANEL
ALT: 18 U/L (ref 0–35)
AST: 17 U/L (ref 0–37)
Albumin: 4.2 g/dL (ref 3.5–5.2)
Alkaline Phosphatase: 86 U/L (ref 39–117)
BILIRUBIN DIRECT: 0.1 mg/dL (ref 0.0–0.3)
BILIRUBIN INDIRECT: 0.3 mg/dL (ref 0.2–1.2)
Total Bilirubin: 0.4 mg/dL (ref 0.2–1.2)
Total Protein: 7.4 g/dL (ref 6.0–8.3)

## 2015-02-06 LAB — MAGNESIUM: MAGNESIUM: 1.6 mg/dL (ref 1.5–2.5)

## 2015-02-06 LAB — CBC WITH DIFFERENTIAL/PLATELET
Basophils Absolute: 0 10*3/uL (ref 0.0–0.1)
Basophils Relative: 0 % (ref 0–1)
EOS PCT: 3 % (ref 0–5)
Eosinophils Absolute: 0.3 10*3/uL (ref 0.0–0.7)
HEMATOCRIT: 38.9 % (ref 36.0–46.0)
Hemoglobin: 12.9 g/dL (ref 12.0–15.0)
Lymphocytes Relative: 31 % (ref 12–46)
Lymphs Abs: 3.4 10*3/uL (ref 0.7–4.0)
MCH: 26.5 pg (ref 26.0–34.0)
MCHC: 33.2 g/dL (ref 30.0–36.0)
MCV: 80 fL (ref 78.0–100.0)
MONO ABS: 0.5 10*3/uL (ref 0.1–1.0)
MONOS PCT: 5 % (ref 3–12)
MPV: 10.1 fL (ref 8.6–12.4)
Neutro Abs: 6.6 10*3/uL (ref 1.7–7.7)
Neutrophils Relative %: 61 % (ref 43–77)
Platelets: 297 10*3/uL (ref 150–400)
RBC: 4.86 MIL/uL (ref 3.87–5.11)
RDW: 14.8 % (ref 11.5–15.5)
WBC: 10.9 10*3/uL — AB (ref 4.0–10.5)

## 2015-02-06 LAB — BASIC METABOLIC PANEL WITH GFR
BUN: 12 mg/dL (ref 6–23)
CHLORIDE: 97 meq/L (ref 96–112)
CO2: 29 meq/L (ref 19–32)
Calcium: 9.5 mg/dL (ref 8.4–10.5)
Creat: 0.56 mg/dL (ref 0.50–1.10)
GFR, Est African American: >89 mL/min
GFR, Est Non African American: >89 mL/min
Glucose, Bld: 120 mg/dL — ABNORMAL HIGH (ref 70–99)
Potassium: 3.4 mEq/L — ABNORMAL LOW (ref 3.5–5.3)
Sodium: 135 mEq/L (ref 135–145)

## 2015-02-06 LAB — LIPID PANEL
Cholesterol: 152 mg/dL (ref 0–200)
HDL: 44 mg/dL — AB (ref >=46)
LDL Cholesterol: 77 mg/dL (ref 0–99)
Total CHOL/HDL Ratio: 3.5 Ratio
Triglycerides: 156 mg/dL — ABNORMAL HIGH (ref <150)
VLDL: 31 mg/dL (ref 0–40)

## 2015-02-06 LAB — TSH: TSH: 2.424 u[IU]/mL (ref 0.350–4.500)

## 2015-02-06 MED ORDER — MECLIZINE HCL 25 MG PO TABS
25.0000 mg | ORAL_TABLET | Freq: Three times a day (TID) | ORAL | Status: DC | PRN
Start: 2015-02-06 — End: 2016-03-27

## 2015-02-06 MED ORDER — PROMETHAZINE HCL 25 MG PO TABS: ORAL_TABLET | ORAL | Status: DC

## 2015-02-06 NOTE — Patient Instructions (Signed)
Diabetes is a very complicated disease...lets simplify it.  An easy way to look at it to understand the complications is if you think of the extra sugar floating in your blood stream as glass shards floating through your blood stream.    Diabetes affects your small vessels first: 1) The glass shards (sugar) scraps down the tiny blood vessels in your eyes and lead to diabetic retinopathy, the leading cause of blindness in the [place]. Diabetes is the leading cause of newly diagnosed adult (20 to 66 years of age) blindness in the [place].  2) The glass shards scratches down the tiny vessels of your legs leading to nerve damage called neuropathy and can lead to amputations of your feet. More than 60% of all non-traumatic amputations of lower limbs occur in people with diabetes.  3) Over time the small vessels in your brain are shredded and closed off, individually this does not cause any problems but over a long period of time many of the small vessels being blocked can lead to Vascular Dementia.   4) Your kidney's are a filter system and have a "net" that keeps certain things in the body and lets bad things out. Sugar shreds this net and leads to kidney damage and eventually failure. Decreasing the sugar that is destroying the net and certain blood pressure medications can help stop or decrease progression of kidney disease. Diabetes was the primary cause of kidney failure in 44 percent of all new cases in 2011.  5) Diabetes also destroys the small vessels in your penis that lead to erectile dysfunction. Eventually the vessels are so damaged that you may not be responsive to cialis or viagra.   Diabetes and your large vessels: Your larger vessels consist of your coronary arteries in your heart and the carotid vessels to your brain. Diabetes or even increased sugars put you at 300% increased risk of heart attack and stroke and this is why.. The sugar scrapes down your large blood vessels and your body  sees this as an internal injury and tries to repair itself. Just like you get a scab on your skin, your platelets will stick to the blood vessel wall trying to heal it. This is why we have diabetics on low dose aspirin daily, this prevents the platelets from sticking and can prevent plaque formation. In addition, your body takes cholesterol and tries to shove it into the open wound. This is why we want your LDL, or bad cholesterol, below 70.   The combination of platelets and cholesterol over 5-10 years forms plaque that can break off and cause a heart attack or stroke.   PLEASE REMEMBER:  Diabetes is preventable! Up to 85 percent of complications and morbidities among individuals with type 2 diabetes can be prevented, delayed, or effectively treated and minimized with regular visits to a health professional, appropriate monitoring and medication, and a healthy diet and lifestyle.  Before you even begin to attack a weight-loss plan, it pays to remember this: You are not fat. You have fat. Losing weight isn't about blame or shame; it's simply another achievement to accomplish. Dieting is like any other skill-you have to buckle down and work at it. As long as you act in a smart, reasonable way, you'll ultimately get where you want to be. Here are some weight loss pearls for you.  1. It's Not a Diet. It's a Lifestyle Thinking of a diet as something you're on and suffering through only for the short term doesn't   work. To shed weight and keep it off, you need to make permanent changes to the way you eat. It's OK to indulge occasionally, of course, but if you cut calories temporarily and then revert to your old way of eating, you'll gain back the weight quicker than you can say yo-yo. Use it to lose it. Research shows that one of the best predictors of long-term weight loss is how many pounds you drop in the first month. For that reason, nutritionists often suggest being stricter for the first two weeks of your  new eating strategy to build momentum. Cut out added sugar and alcohol and avoid unrefined carbs. After that, figure out how you can reincorporate them in a way that's healthy and maintainable.  2. There's a Right Way to Exercise Working out burns calories and fat and boosts your metabolism by building muscle. But those trying to lose weight are notorious for overestimating the number of calories they burn and underestimating the amount they take in. Unfortunately, your system is biologically programmed to hold on to extra pounds and that means when you start exercising, your body senses the deficit and ramps up its hunger signals. If you're not diligent, you'll eat everything you burn and then some. Use it to lose it. Cardio gets all the exercise glory, but strength and interval training are the real heroes. They help you build lean muscle, which in turn increases your metabolism and calorie-burning ability 3. Don't Overreact to Mild Hunger Some people have a hard time losing weight because of hunger anxiety. To them, being hungry is bad-something to be avoided at all costs-so they carry snacks with them and eat when they don't need to. Others eat because they're stressed out or bored. While you never want to get to the point of being ravenous (that's when bingeing is likely to happen), a hunger pang, a craving, or the fact that it's 3:00 p.m. should not send you racing for the vending machine or obsessing about the energy bar in your purse. Ideally, you should put off eating until your stomach is growling and it's difficult to concentrate.  Use it to lose it. When you feel the urge to eat, use the HALT method. Ask yourself, Am I really hungry? Or am I angry or anxious, lonely or bored, or tired? If you're still not certain, try the apple test. If you're truly hungry, an apple should seem delicious; if it doesn't, something else is going on. Or you can try drinking water and making yourself busy, if you are  still hungry try a healthy snack.  4. Not All Calories Are Created Equal The mechanics of weight loss are pretty simple: Take in fewer calories than you use for energy. But the kind of food you eat makes all the difference. Processed food that's high in saturated fat and refined starch or sugar can cause inflammation that disrupts the hormone signals that tell your brain you're full. The result: You eat a lot more.  Use it to lose it. Clean up your diet. Swap in whole, unprocessed foods, including vegetables, lean protein, and healthy fats that will fill you up and give you the biggest nutritional bang for your calorie buck. In a few weeks, as your brain starts receiving regular hunger and fullness signals once again, you'll notice that you feel less hungry overall and naturally start cutting back on the amount you eat.  5. Protein, Produce, and Plant-Based Fats Are Your Weight-Loss Trinity Here's why eating the three   Ps regularly will help you drop pounds. Protein fills you up. You need it to build lean muscle, which keeps your metabolism humming so that you can torch more fat. People in a weight-loss program who ate double the recommended daily allowance for protein (about 110 grams for a 150-pound woman) lost 70 percent of their weight from fat, while people who ate the RDA lost only about 40 percent, one study found. Produce is packed with filling fiber. "It's very difficult to consume too many calories if you're eating a lot of vegetables. Example: Three cups of broccoli is a lot of food, yet only 93 calories. (Fruit is another story. It can be easy to overeat and can contain a lot of calories from sugar, so be sure to monitor your intake.) Plant-based fats like olive oil and those in avocados and nuts are healthy and extra satiating.  Use it to lose it. Aim to incorporate each of the three Ps into every meal and snack. People who eat protein throughout the day are able to keep weight off, according  to a study in the American Journal of Clinical Nutrition. In addition to meat, poultry and seafood, good sources are beans, lentils, eggs, tofu, and yogurt. As for fat, keep portion sizes in check by measuring out salad dressing, oil, and nut butters (shoot for one to two tablespoons). Finally, eat veggies or a little fruit at every meal. People who did that consumed 308 fewer calories but didn't feel any hungrier than when they didn't eat more produce.  7. How You Eat Is As Important As What You Eat In order for your brain to register that you're full, you need to focus on what you're eating. Sit down whenever you eat, preferably at a table. Turn off the TV or computer, put down your phone, and look at your food. Smell it. Chew slowly, and don't put another bite on your fork until you swallow. When women ate lunch this attentively, they consumed 30 percent less when snacking later than those who listened to an audiobook at lunchtime, according to a study in the British Journal of Nutrition. 8. Weighing Yourself Really Works The scale provides the best evidence about whether your efforts are paying off. Seeing the numbers tick up or down or stagnate is motivation to keep going-or to rethink your approach. A 2015 study at [employer] found that daily weigh-ins helped people lose more weight, keep it off, and maintain that loss, even after two years. Use it to lose it. Step on the scale at the same time every day for the best results. If your weight shoots up several pounds from one weigh-in to the next, don't freak out. Eating a lot of salt the night before or having your period is the likely culprit. The number should return to normal in a day or two. It's a steady climb that you need to do something about. 9. Too Much Stress and Too Little Sleep Are Your Enemies When you're tired and frazzled, your body cranks up the production of cortisol, the stress hormone that can cause carb cravings. Not getting  enough sleep also boosts your levels of ghrelin, a hormone associated with hunger, while suppressing leptin, a hormone that signals fullness and satiety. People on a diet who slept only five and a half hours a night for two weeks lost 55 percent less fat and were hungrier than those who slept eight and a half hours, according to a study in the [employer] Medical   [employer] Journal. Use it to lose it. Prioritize sleep, aiming for seven hours or more a night, which research shows helps lower stress. And make sure you're getting quality zzz's. If a snoring spouse or a fidgety cat wakes you up frequently throughout the night, you may end up getting the equivalent of just four hours of sleep, according to a study from [facility]. Keep pets out of the bedroom, and use a white-noise app to drown out snoring. 10. You Will Hit a plateau-And You Can Bust Through It As you slim down, your body releases much less leptin, the fullness hormone.  If you're not strength training, start right now. Building muscle can raise your metabolism to help you overcome a plateau. To keep your body challenged and burning calories, incorporate new moves and more intense intervals into your workouts or add another sweat session to your weekly routine. Alternatively, cut an extra 100 calories or so a day from your diet. Now that you've lost weight, your body simply doesn't need as much fuel.   .Carpal Tunnel Syndrome The carpal tunnel is a narrow area located on the palm side of your wrist. The tunnel is formed by the wrist bones and ligaments. Nerves, blood vessels, and tendons pass through the carpal tunnel. Repeated wrist motion or certain diseases may cause swelling within the tunnel. This swelling pinches the main nerve in the wrist (median nerve) and causes the painful hand and arm condition called carpal tunnel syndrome. CAUSES   Repeated wrist motions.  Wrist injuries.  Certain diseases like arthritis, diabetes,  alcoholism, hyperthyroidism, and kidney failure.  Obesity.  Pregnancy. SYMPTOMS   A "pins and needles" feeling in your fingers or hand, especially in your thumb, index and middle fingers.  Tingling or numbness in your fingers or hand.  An aching feeling in your entire arm, especially when your wrist and elbow are bent for long periods of time.  Wrist pain that goes up your arm to your shoulder.  Pain that goes down into your palm or fingers.  A weak feeling in your hands. DIAGNOSIS  Your health care provider will take your history and perform a physical exam. An electromyography test may be needed. This test measures electrical signals sent out by your nerves into the muscles. The electrical signals are usually slowed by carpal tunnel syndrome. You may also need X-rays. TREATMENT  Carpal tunnel syndrome may clear up by itself. Your health care provider may recommend a wrist splint or medicine such as a nonsteroidal anti-inflammatory medicine. Cortisone injections may help. Sometimes, surgery may be needed to free the pinched nerve.  HOME CARE INSTRUCTIONS   Take all medicine as directed by your health care provider. Only take over-the-counter or prescription medicines for pain, discomfort, or fever as directed by your health care provider.  If you were given a splint to keep your wrist from bending, wear it as directed. It is important to wear the splint at night. Wear the splint for as long as you have pain or numbness in your hand, arm, or wrist. This may take 1 to 2 months.  Rest your wrist from any activity that may be causing your pain. If your symptoms are work-related, you may need to talk to your employer about changing to a job that does not require using your wrist.  Put ice on your wrist after long periods of wrist activity.  Put ice in a plastic bag.  Place a towel between your skin and the bag.  Leave the ice on for 15-20 minutes, 03-04 times a day.  Keep all  follow-up visits as directed by your health care provider. This includes any orthopedic referrals, physical therapy, and rehabilitation. Any delay in getting necessary care could result in a delay or failure of your condition to heal. SEEK IMMEDIATE MEDICAL CARE IF:   You have new, unexplained symptoms.  Your symptoms get worse and are not helped or controlled with medicines. MAKE SURE YOU:   Understand these instructions.  Will watch your condition.  Will get help right away if you are not doing well or get worse. Document Released: 09/02/2000 Document Revised: 01/20/2014 Document Reviewed: 07/22/2011 [facility] Patient Information 2015 [place], [place]. This information is not intended to replace advice given to you by your health care provider. Make sure you discuss any questions you have with your health care provider.

## 2015-02-06 NOTE — Progress Notes (Signed)
Assessment and Plan:  1. Hypertension -Continue medication, monitor blood pressure at home. Continue DASH diet.  Reminder to go to the ER if any CP, SOB, nausea, dizziness, severe HA, changes vision/speech, left arm numbness and tingling and jaw pain.  2. Cholesterol -Continue diet and exercise. Check cholesterol.   3. Diabetes with diabetic autonomic neuropathy -Continue diet and exercise. Check A1C  4. Vitamin D Def - check level and continue medications.   5. Right hand numbness DDX compression nerve of elbow versus CTS + phalen's sign, continue brace, ice, mobic x 2 weeks, since she is right handed and having weakness will refer to ortho, wants [place] [facility]  6. Seb Keratosis Patient reassured.   Continue diet and meds as discussed. Further disposition pending results of labs. Discussed med's effects and SE's.   Over 30 minutes of exam, counseling, chart review, and critical decision making was performed   HPI 64 y.o. female  presents for 3 month follow up on hypertension, cholesterol, diabetes and vitamin D deficiency.   Her blood pressure has been controlled at home, today her BP is BP: 132/80 mmHg.  She does workout. She denies chest pain, shortness of breath, dizziness.  She is not on cholesterol medication and denies myalgias. Her cholesterol is at goal. The cholesterol was:   Lab Results  Component Value Date   CHOL 161 10/31/2014   HDL 43 10/31/2014   LDLCALC 79 10/31/2014   TRIG 193* 10/31/2014   CHOLHDL 3.7 10/31/2014    She has been working on diet and exercise for diabetes with diabetic autonomic neuropathy, she is on bASA, she is not on ACE/ARB, and denies  paresthesia of the feet, polydipsia, polyuria and visual disturbances. Last A1C was:  Lab Results  Component Value Date   HGBA1C 5.9* 10/31/2014   Patient is on Vitamin D supplement. Lab Results  Component Value Date   VD25OH 35 10/31/2014   She has a mole on her back that she feels is crusting and may  have changed.  Right handed female. She has right arm numbness/tingling when she is on the phone for a long time She states it is at her forearm/wrist to all of her hand. It will happen in the morning she wakes up, and when she holds her phone with flexed elbow to her ear. Has had CTS in the past and has been wearing a brace that has  helped. She has felt weaker and has been dropping things. Denies neck pain.   BMI is Body mass index is 30.66 kg/(m^2)., she is working on diet and exercise. Wt Readings from Last 3 Encounters:  02/06/15 157 lb (71.215 kg)  10/31/14 150 lb 9.6 oz (68.312 kg)  07/24/14 156 lb (70.761 kg)     Current Medications:  Current Outpatient Prescriptions on File Prior to Visit  Medication Sig Dispense Refill  . aspirin 81 MG chewable tablet Chew by mouth daily.    Marland Kitchen BAYER MICROLET LANCETS lancets Use as instructed 100 each 12  . bisoprolol-hydrochlorothiazide (ZIAC) 5-6.25 MG per tablet Take 1 tablet by mouth daily. 90 tablet 1  . Blood Glucose Monitoring Suppl (CONTOUR BLOOD GLUCOSE SYSTEM) DEVI Test Blood sugar once daily 1 Device 0  . cetirizine (ZYRTEC) 10 MG tablet Take 10 mg by mouth daily.    . Cholecalciferol (VITAMIN D PO) Take 2,000 Int'l Units by mouth 2 (two) times daily.     . ferrous sulfate dried (SLOW FE) 160 (50 FE) MG TBCR SR tablet Take 160  mg by mouth daily.    Marland Kitchen glucose blood (BAYER CONTOUR TEST) test strip Use as instructed 100 each 12  . hydrochlorothiazide (HYDRODIURIL) 25 MG tablet TAKE 1 TABLET (25 MG TOTAL) BY MOUTH DAILY. FOR BLOOD PRESSURE AND FLUID 90 tablet 3  . Magnesium 500 MG CAPS Take 500 mg by mouth daily.    . meclizine (ANTIVERT) 25 MG tablet Take 25 mg by mouth 3 (three) times daily as needed for dizziness.    . metFORMIN (GLUCOPHAGE-XR) 500 MG 24 hr tablet Take 500 mg 2 tabs twice daily. 360 tablet 1  . montelukast (SINGULAIR) 10 MG tablet TAKE ONE TABLET BY MOUTH ONCE DAILY FOR  ALLERGIES 90 tablet 1  . OVER THE COUNTER  MEDICATION One Source supplement 2 times daily for hair,skin and nails    . phentermine (ADIPEX-P) 37.5 MG tablet 1/2 to 1 tablet daily for weight loss 30 tablet 5  . promethazine (PHENERGAN) 25 MG tablet TAKE 1 TABLET BY MOUTH AS NEEDED DIZZINESS 30 tablet 0  . simvastatin (ZOCOR) 20 MG tablet TAKE 1 TABLET AT BEDTIME FOR CHOLESTEROL 90 tablet 1   No current facility-administered medications on file prior to visit.   Medical History:  Past Medical History  Diagnosis Date  . Hyperlipidemia   . Hypertension   . Type II or unspecified type diabetes mellitus without mention of complication, not stated as uncontrolled   . Neuropathy   . Allergy   . Iron deficiency   . Vitamin D deficiency   . Meniere's disease    Allergies:  Allergies  Allergen Reactions  . Penicillins      Review of Systems:  Review of Systems  Constitutional: Negative.   HENT: Negative.   Respiratory: Negative.   Cardiovascular: Negative.   Gastrointestinal: Negative.   Genitourinary: Negative.   Musculoskeletal: Positive for myalgias. Negative for back pain, joint pain, falls and neck pain.  Skin: Positive for rash. Negative for itching.  Neurological: Positive for sensory change (right arm). Negative for dizziness, tingling, tremors, speech change, focal weakness, seizures and loss of consciousness.  Psychiatric/Behavioral: Negative.     Family history- Review and unchanged Social history- Review and unchanged Physical Exam: BP 132/80 mmHg  Pulse 80  Temp(Src) 97.7 F (36.5 C)  Resp 16  Wt 157 lb (71.215 kg) Wt Readings from Last 3 Encounters:  02/06/15 157 lb (71.215 kg)  10/31/14 150 lb 9.6 oz (68.312 kg)  07/24/14 156 lb (70.761 kg)   General Appearance: Well nourished, in no apparent distress. Eyes: PERRLA, EOMs, conjunctiva no swelling or erythema Sinuses: No Frontal/maxillary tenderness ENT/Mouth: Ext aud canals clear, TMs without erythema, bulging. No erythema, swelling, or exudate on  post pharynx.  Tonsils not swollen or erythematous. Hearing normal.  Neck: Supple, thyroid normal.  Respiratory: Respiratory effort normal, BS equal bilaterally without rales, rhonchi, wheezing or stridor.  Cardio: RRR with no MRGs. Brisk peripheral pulses without edema.  Abdomen: Soft, + BS.  Non tender, no guarding, rebound, hernias, masses. Lymphatics: Non tender without lymphadenopathy.  Musculoskeletal: Full ROM, 5/5 strength, Normal gait, Right carpal tunnel compression test is positive, no thenar or hypothenar atrophy, Phalen's: positive, strength normal, Tinel's sign: positive and wrist has normal range of motion. Full ROM neck, right shoulder, and elbow. Mild tenderness at medial epicondyle. Good distal neurovascular exam.  Skin: Warm, dry without rashes, lesions, ecchymosis. Has several dry scaling nodules- Seb keratosis.  Neuro: Cranial nerves intact. No cerebellar symptoms.  Psych: Awake and oriented X 3, normal affect,  Insight and Judgment appropriate.    Vicie Mutters, PA-C 9:18 AM [facility]

## 2015-02-07 LAB — HEMOGLOBIN A1C
HEMOGLOBIN A1C: 6.2 % — AB (ref <5.7)
MEAN PLASMA GLUCOSE: 131 mg/dL — AB (ref <117)

## 2015-02-07 LAB — VITAMIN D 25 HYDROXY (VIT D DEFICIENCY, FRACTURES): VIT D 25 HYDROXY: 82 ng/mL (ref 30–100)

## 2015-02-11 ENCOUNTER — Encounter: Payer: Self-pay | Admitting: Internal Medicine

## 2015-02-24 ENCOUNTER — Other Ambulatory Visit: Payer: Medicare Other

## 2015-02-24 DIAGNOSIS — Z79899 Other long term (current) drug therapy: Secondary | ICD-10-CM | POA: Diagnosis not present

## 2015-02-24 LAB — BASIC METABOLIC PANEL WITH GFR
BUN: 10 mg/dL (ref 6–23)
CALCIUM: 10.1 mg/dL (ref 8.4–10.5)
CO2: 28 meq/L (ref 19–32)
Chloride: 99 mEq/L (ref 96–112)
Creat: 0.56 mg/dL (ref 0.50–1.10)
GFR, Est African American: >89 mL/min
Glucose, Bld: 128 mg/dL — ABNORMAL HIGH (ref 70–99)
POTASSIUM: 3.8 meq/L (ref 3.5–5.3)
SODIUM: 138 meq/L (ref 135–145)

## 2015-02-24 LAB — MAGNESIUM: MAGNESIUM: 1.7 mg/dL (ref 1.5–2.5)

## 2015-04-02 DIAGNOSIS — M25531 Pain in right wrist: Secondary | ICD-10-CM | POA: Diagnosis not present

## 2015-04-02 DIAGNOSIS — M79641 Pain in right hand: Secondary | ICD-10-CM | POA: Diagnosis not present

## 2015-04-15 ENCOUNTER — Encounter: Payer: Self-pay | Admitting: Internal Medicine

## 2015-04-30 DIAGNOSIS — G5601 Carpal tunnel syndrome, right upper limb: Secondary | ICD-10-CM | POA: Diagnosis not present

## 2015-06-12 ENCOUNTER — Other Ambulatory Visit: Payer: Self-pay | Admitting: Internal Medicine

## 2015-06-15 ENCOUNTER — Encounter: Payer: Self-pay | Admitting: Internal Medicine

## 2015-06-15 ENCOUNTER — Ambulatory Visit (INDEPENDENT_AMBULATORY_CARE_PROVIDER_SITE_OTHER): Payer: Medicare Other | Admitting: Internal Medicine

## 2015-06-15 VITALS — BP 142/84 | HR 76 | Temp 97.5°F | Resp 16 | Ht 60.5 in | Wt 160.4 lb

## 2015-06-15 DIAGNOSIS — Z1331 Encounter for screening for depression: Secondary | ICD-10-CM

## 2015-06-15 DIAGNOSIS — I10 Essential (primary) hypertension: Secondary | ICD-10-CM

## 2015-06-15 DIAGNOSIS — Z79899 Other long term (current) drug therapy: Secondary | ICD-10-CM

## 2015-06-15 DIAGNOSIS — E1143 Type 2 diabetes mellitus with diabetic autonomic (poly)neuropathy: Secondary | ICD-10-CM

## 2015-06-15 DIAGNOSIS — Z1389 Encounter for screening for other disorder: Secondary | ICD-10-CM

## 2015-06-15 DIAGNOSIS — E1140 Type 2 diabetes mellitus with diabetic neuropathy, unspecified: Secondary | ICD-10-CM

## 2015-06-15 DIAGNOSIS — E1142 Type 2 diabetes mellitus with diabetic polyneuropathy: Secondary | ICD-10-CM | POA: Diagnosis not present

## 2015-06-15 DIAGNOSIS — Z9181 History of falling: Secondary | ICD-10-CM

## 2015-06-15 DIAGNOSIS — Z789 Other specified health status: Secondary | ICD-10-CM

## 2015-06-15 DIAGNOSIS — G990 Autonomic neuropathy in diseases classified elsewhere: Secondary | ICD-10-CM | POA: Diagnosis not present

## 2015-06-15 DIAGNOSIS — Z23 Encounter for immunization: Secondary | ICD-10-CM

## 2015-06-15 DIAGNOSIS — E559 Vitamin D deficiency, unspecified: Secondary | ICD-10-CM

## 2015-06-15 DIAGNOSIS — E785 Hyperlipidemia, unspecified: Secondary | ICD-10-CM

## 2015-06-15 DIAGNOSIS — Z6830 Body mass index (BMI) 30.0-30.9, adult: Secondary | ICD-10-CM

## 2015-06-15 DIAGNOSIS — Z1212 Encounter for screening for malignant neoplasm of rectum: Secondary | ICD-10-CM

## 2015-06-15 LAB — BASIC METABOLIC PANEL WITH GFR
BUN: 9 mg/dL (ref 7–25)
CALCIUM: 9.7 mg/dL (ref 8.6–10.4)
CO2: 27 mmol/L (ref 20–31)
Chloride: 102 mmol/L (ref 98–110)
Creat: 0.47 mg/dL — ABNORMAL LOW (ref 0.50–0.99)
GLUCOSE: 120 mg/dL — AB (ref 65–99)
Potassium: 3.8 mmol/L (ref 3.5–5.3)
Sodium: 139 mmol/L (ref 135–146)

## 2015-06-15 LAB — CBC WITH DIFFERENTIAL/PLATELET
BASOS PCT: 0 % (ref 0–1)
Basophils Absolute: 0 10*3/uL (ref 0.0–0.1)
EOS ABS: 0.3 10*3/uL (ref 0.0–0.7)
EOS PCT: 3 % (ref 0–5)
HEMATOCRIT: 37.3 % (ref 36.0–46.0)
Hemoglobin: 12.4 g/dL (ref 12.0–15.0)
LYMPHS PCT: 34 % (ref 12–46)
Lymphs Abs: 2.9 10*3/uL (ref 0.7–4.0)
MCH: 26.8 pg (ref 26.0–34.0)
MCHC: 33.2 g/dL (ref 30.0–36.0)
MCV: 80.6 fL (ref 78.0–100.0)
MONO ABS: 0.4 10*3/uL (ref 0.1–1.0)
MPV: 9.7 fL (ref 8.6–12.4)
Monocytes Relative: 5 % (ref 3–12)
Neutro Abs: 4.9 10*3/uL (ref 1.7–7.7)
Neutrophils Relative %: 58 % (ref 43–77)
Platelets: 246 10*3/uL (ref 150–400)
RBC: 4.63 MIL/uL (ref 3.87–5.11)
RDW: 15.2 % (ref 11.5–15.5)
WBC: 8.5 10*3/uL (ref 4.0–10.5)

## 2015-06-15 LAB — LIPID PANEL
CHOLESTEROL: 153 mg/dL (ref 125–200)
HDL: 40 mg/dL — ABNORMAL LOW (ref >=46)
LDL Cholesterol: 81 mg/dL (ref <130)
TRIGLYCERIDES: 159 mg/dL — AB (ref <150)
Total CHOL/HDL Ratio: 3.8 Ratio (ref <=5.0)
VLDL: 32 mg/dL — ABNORMAL HIGH (ref <30)

## 2015-06-15 LAB — HEPATIC FUNCTION PANEL
ALBUMIN: 4.3 g/dL (ref 3.6–5.1)
ALT: 19 U/L (ref 6–29)
AST: 18 U/L (ref 10–35)
Alkaline Phosphatase: 73 U/L (ref 33–130)
BILIRUBIN INDIRECT: 0.3 mg/dL (ref 0.2–1.2)
Bilirubin, Direct: 0.1 mg/dL (ref <=0.2)
TOTAL PROTEIN: 6.8 g/dL (ref 6.1–8.1)
Total Bilirubin: 0.4 mg/dL (ref 0.2–1.2)

## 2015-06-15 LAB — MAGNESIUM: Magnesium: 1.7 mg/dL (ref 1.5–2.5)

## 2015-06-15 NOTE — Patient Instructions (Signed)
Recommend Adult Low dose Aspirin or coated  Aspirin 81 mg daily   To reduce risk of Colon Cancer 20 %,   Skin Cancer 26 % ,   Melanoma 46%   and   Pancreatic cancer 60% ++++++++++++++++++ Vitamin D goal is between 70-100.   Please make sure that you are taking your Vitamin D as directed.   It is very important as a natural anti-inflammatory   helping hair, skin, and nails, as well as reducing stroke and heart attack risk.   It helps your bones and helps with mood.  It also decreases numerous cancer risks so please take it as directed.   Low Vit D is associated with a 200-300% higher risk for CANCER   and 200-300% higher risk for HEART   ATTACK  &  STROKE.   ......................................  It is also associated with higher death rate at younger ages,   autoimmune diseases like Rheumatoid arthritis, Lupus, Multiple Sclerosis.     Also many other serious conditions, like depression, Alzheimer's  Dementia, infertility, muscle aches, fatigue, fibromyalgia - just to name a few.  +++++++++++++++++++  Recommend the book "The END of DIETING" by Dr Joel Fuhrman   & the book "The END of DIABETES " by Dr Joel Fuhrman  At [employer] - get book & Audio CD's     Being diabetic has a  300% increased risk for heart attack, stroke, cancer, and alzheimer- type vascular dementia. It is very important that you work harder with diet by avoiding all foods that are white. Avoid white rice (brown & wild rice is OK), white potatoes (sweetpotatoes in moderation is OK), White bread or wheat bread or anything made out of white flour like bagels, donuts, rolls, buns, biscuits, cakes, pastries, cookies, pizza crust, and pasta (made from white flour & egg whites) - vegetarian pasta or spinach or wheat pasta is OK. Multigrain breads like Arnold's or [employer], or multigrain sandwich thins or flatbreads.  Diet, exercise and weight loss can reverse and cure diabetes in the early stages.   Diet, exercise and weight loss is very important in the control and prevention of complications of diabetes which affects every system in your body, ie. Brain - dementia/stroke, eyes - glaucoma/blindness, heart - heart attack/heart failure, kidneys - dialysis, stomach - gastric paralysis, intestines - malabsorption, nerves - severe painful neuritis, circulation - gangrene & loss of a leg(s), and finally cancer and Alzheimers.    I recommend avoid fried & greasy foods,  sweets/candy, white rice (brown or wild rice or Quinoa is OK), white potatoes (sweet potatoes are OK) - anything made from white flour - bagels, doughnuts, rolls, buns, biscuits,white and wheat breads, pizza crust and traditional pasta made of white flour & egg white(vegetarian pasta or spinach or wheat pasta is OK).  Multi-grain bread is OK - like multi-grain flat bread or sandwich thins. Avoid alcohol in excess. Exercise is also important.    Eat all the vegetables you want - avoid meat, especially red meat and dairy - especially cheese.  Cheese is the most concentrated form of trans-fats which is the worst thing to clog up our arteries. Veggie cheese is OK which can be found in the fresh produce section at [employer] or [employer] or [employer]  ++++++++++++++++++++++++++   Preventive Care for Adults  A healthy lifestyle and preventive care can promote health and wellness. Preventive health guidelines for women include the following key practices.  A routine yearly physical is a good way   to check with your health care provider about your health and preventive screening. It is a chance to share any concerns and updates on your health and to receive a thorough exam.  Visit your dentist for a routine exam and preventive care every 6 months. Brush your teeth twice a day and floss once a day. Good oral hygiene prevents tooth decay and gum disease.  The frequency of eye exams is based on your age, health, family medical history, use  of contact lenses, and other factors. Follow your health care provider's recommendations for frequency of eye exams.  Eat a healthy diet. Foods like vegetables, fruits, whole grains, low-fat dairy products, and lean protein foods contain the nutrients you need without too many calories. Decrease your intake of foods high in solid fats, added sugars, and salt. Eat the right amount of calories for you.Get information about a proper diet from your health care provider, if necessary.  Regular physical exercise is one of the most important things you can do for your health. Most adults should get at least 150 minutes of moderate-intensity exercise (any activity that increases your heart rate and causes you to sweat) each week. In addition, most adults need muscle-strengthening exercises on 2 or more days a week.  Maintain a healthy weight. The body mass index (BMI) is a screening tool to identify possible weight problems. It provides an estimate of body fat based on height and weight. Your health care provider can find your BMI and can help you achieve or maintain a healthy weight.For adults 20 years and older:  A BMI below 18.5 is considered underweight.  A BMI of 18.5 to 24.9 is normal.  A BMI of 25 to 29.9 is considered overweight.  A BMI of 30 and above is considered obese.  Maintain normal blood lipids and cholesterol levels by exercising and minimizing your intake of saturated fat. Eat a balanced diet with plenty of fruit and vegetables. If your lipid or cholesterol levels are high, you are over 50, or you are at high risk for heart disease, you may need your cholesterol levels checked more frequently.Ongoing high lipid and cholesterol levels should be treated with medicines if diet and exercise are not working.  If you smoke, find out from your health care provider how to quit. If you do not use tobacco, do not start.  Lung cancer screening is recommended for adults aged 55-80 years who are  at high risk for developing lung cancer because of a history of smoking. A yearly low-dose CT scan of the lungs is recommended for people who have at least a 30-pack-year history of smoking and are a current smoker or have quit within the past 15 years. A pack year of smoking is smoking an average of 1 pack of cigarettes a day for 1 year (for example: 1 pack a day for 30 years or 2 packs a day for 15 years). Yearly screening should continue until the smoker has stopped smoking for at least 15 years. Yearly screening should be stopped for people who develop a health problem that would prevent them from having lung cancer treatment.  Avoid use of street drugs. Do not share needles with anyone. Ask for help if you need support or instructions about stopping the use of drugs.  High blood pressure causes heart disease and increases the risk of stroke.  Ongoing high blood pressure should be treated with medicines if weight loss and exercise do not work.  If you are 55-79   years old, ask your health care provider if you should take aspirin to prevent strokes.  Diabetes screening involves taking a blood sample to check your fasting blood sugar level. This should be done once every 3 years, after age 45, if you are within normal weight and without risk factors for diabetes. Testing should be considered at a younger age or be carried out more frequently if you are overweight and have at least 1 risk factor for diabetes.  Breast cancer screening is essential preventive care for women. You should practice "breast self-awareness." This means understanding the normal appearance and feel of your breasts and may include breast self-examination. Any changes detected, no matter how small, should be reported to a health care provider. Women in their 20s and 30s should have a clinical breast exam (CBE) by a health care provider as part of a regular health exam every 1 to 3 years. After age 40, women should have a CBE every  year. Starting at age 40, women should consider having a mammogram (breast X-ray test) every year. Women who have a family history of breast cancer should talk to their health care provider about genetic screening. Women at a high risk of breast cancer should talk to their health care providers about having an MRI and a mammogram every year.  Breast cancer gene (BRCA)-related cancer risk assessment is recommended for women who have family members with BRCA-related cancers. BRCA-related cancers include breast, ovarian, tubal, and peritoneal cancers. Having family members with these cancers may be associated with an increased risk for harmful changes (mutations) in the breast cancer genes BRCA1 and BRCA2. Results of the assessment will determine the need for genetic counseling and BRCA1 and BRCA2 testing.  Routine pelvic exams to screen for cancer are no longer recommended for nonpregnant women who are considered low risk for cancer of the pelvic organs (ovaries, uterus, and vagina) and who do not have symptoms. Ask your health care provider if a screening pelvic exam is right for you.  If you have had past treatment for cervical cancer or a condition that could lead to cancer, you need Pap tests and screening for cancer for at least 20 years after your treatment. If Pap tests have been discontinued, your risk factors (such as having a new sexual partner) need to be reassessed to determine if screening should be resumed. Some women have medical problems that increase the chance of getting cervical cancer. In these cases, your health care provider may recommend more frequent screening and Pap tests.    Colorectal cancer can be detected and often prevented. Most routine colorectal cancer screening begins at the age of 50 years and continues through age 75 years. However, your health care provider may recommend screening at an earlier age if you have risk factors for colon cancer. On a yearly basis, your health  care provider may provide home test kits to check for hidden blood in the stool. Use of a small camera at the end of a tube, to directly examine the colon (sigmoidoscopy or colonoscopy), can detect the earliest forms of colorectal cancer. Talk to your health care provider about this at age 50, when routine screening begins. Direct exam of the colon should be repeated every 5-10 years through age 75 years, unless early forms of pre-cancerous polyps or small growths are found.  Osteoporosis is a disease in which the bones lose minerals and strength with aging. This can result in serious bone fractures or breaks. The risk of osteoporosis   can be identified using a bone density scan. Women ages 65 years and over and women at risk for fractures or osteoporosis should discuss screening with their health care providers. Ask your health care provider whether you should take a calcium supplement or vitamin D to reduce the rate of osteoporosis.  Menopause can be associated with physical symptoms and risks. Hormone replacement therapy is available to decrease symptoms and risks. You should talk to your health care provider about whether hormone replacement therapy is right for you.  Use sunscreen. Apply sunscreen liberally and repeatedly throughout the day. You should seek shade when your shadow is shorter than you. Protect yourself by wearing long sleeves, pants, a wide-brimmed hat, and sunglasses year round, whenever you are outdoors.  Once a month, do a whole body skin exam, using a mirror to look at the skin on your back. Tell your health care provider of new moles, moles that have irregular borders, moles that are larger than a pencil eraser, or moles that have changed in shape or color.  Stay current with required vaccines (immunizations).  Influenza vaccine. All adults should be immunized every year.  Tetanus, diphtheria, and acellular pertussis (Td, Tdap) vaccine. Pregnant women should receive 1 dose of  Tdap vaccine during each pregnancy. The dose should be obtained regardless of the length of time since the last dose. Immunization is preferred during the 27th-36th week of gestation. An adult who has not previously received Tdap or who does not know her vaccine status should receive 1 dose of Tdap. This initial dose should be followed by tetanus and diphtheria toxoids (Td) booster doses every 10 years. Adults with an unknown or incomplete history of completing a 3-dose immunization series with Td-containing vaccines should begin or complete a primary immunization series including a Tdap dose. Adults should receive a Td booster every 10 years.    Zoster vaccine. One dose is recommended for adults aged 60 years or older unless certain conditions are present.    Pneumococcal 13-valent conjugate (PCV13) vaccine. When indicated, a person who is uncertain of her immunization history and has no record of immunization should receive the PCV13 vaccine. An adult aged 19 years or older who has certain medical conditions and has not been previously immunized should receive 1 dose of PCV13 vaccine. This PCV13 should be followed with a dose of pneumococcal polysaccharide (PPSV23) vaccine. The PPSV23 vaccine dose should be obtained at least 8 weeks after the dose of PCV13 vaccine. An adult aged 19 years or older who has certain medical conditions and previously received 1 or more doses of PPSV23 vaccine should receive 1 dose of PCV13. The PCV13 vaccine dose should be obtained 1 or more years after the last PPSV23 vaccine dose.    Pneumococcal polysaccharide (PPSV23) vaccine. When PCV13 is also indicated, PCV13 should be obtained first. All adults aged 65 years and older should be immunized. An adult younger than age 65 years who has certain medical conditions should be immunized. Any person who resides in a nursing home or long-term care facility should be immunized. An adult smoker should be immunized. People with an  immunocompromised condition and certain other conditions should receive both PCV13 and PPSV23 vaccines. People with human immunodeficiency virus (HIV) infection should be immunized as soon as possible after diagnosis. Immunization during chemotherapy or radiation therapy should be avoided. Routine use of PPSV23 vaccine is not recommended for American Indians, [place] Natives, or people younger than 65 years unless there are medical conditions that require   PPSV23 vaccine. When indicated, people who have unknown immunization and have no record of immunization should receive PPSV23 vaccine. One-time revaccination 5 years after the first dose of PPSV23 is recommended for people aged 19-64 years who have chronic kidney failure, nephrotic syndrome, asplenia, or immunocompromised conditions. People who received 1-2 doses of PPSV23 before age 65 years should receive another dose of PPSV23 vaccine at age 65 years or later if at least 5 years have passed since the previous dose. Doses of PPSV23 are not needed for people immunized with PPSV23 at or after age 65 years.   Preventive Services / Frequency  Ages 65 years and over  Blood pressure check.  Lipid and cholesterol check.  Lung cancer screening. / Every year if you are aged 55-80 years and have a 30-pack-year history of smoking and currently smoke or have quit within the past 15 years. Yearly screening is stopped once you have quit smoking for at least 15 years or develop a health problem that would prevent you from having lung cancer treatment.  Clinical breast exam.** / Every year after age 40 years.  BRCA-related cancer risk assessment.** / For women who have family members with a BRCA-related cancer (breast, ovarian, tubal, or peritoneal cancers).  Mammogram.** / Every year beginning at age 40 years and continuing for as long as you are in good health. Consult with your health care provider.  Pap test.** / Every 3 years starting at age 30 years  through age 65 or 70 years with 3 consecutive normal Pap tests. Testing can be stopped between 65 and 70 years with 3 consecutive normal Pap tests and no abnormal Pap or HPV tests in the past 10 years.  Fecal occult blood test (FOBT) of stool. / Every year beginning at age 50 years and continuing until age 75 years. You may not need to do this test if you get a colonoscopy every 10 years.  Flexible sigmoidoscopy or colonoscopy.** / Every 5 years for a flexible sigmoidoscopy or every 10 years for a colonoscopy beginning at age 50 years and continuing until age 75 years.  Hepatitis C blood test.** / For all people born from 1945 through 1965 and any individual with known risks for hepatitis C.  Osteoporosis screening.** / A one-time screening for women ages 65 years and over and women at risk for fractures or osteoporosis.  Skin self-exam. / Monthly.  Influenza vaccine. / Every year.  Tetanus, diphtheria, and acellular pertussis (Tdap/Td) vaccine.** / 1 dose of Td every 10 years.  Zoster vaccine.** / 1 dose for adults aged 60 years or older.  Pneumococcal 13-valent conjugate (PCV13) vaccine.** / Consult your health care provider.  Pneumococcal polysaccharide (PPSV23) vaccine.** / 1 dose for all adults aged 65 years and older. Screening for abdominal aortic aneurysm (AAA)  by ultrasound is recommended for people who have history of high blood pressure or who are current or former smokers. 

## 2015-06-15 NOTE — Progress Notes (Signed)
Patient ID: Regina Cervantes, female   DOB: 27-Nov-1950, 64 y.o.   MRN: [id number]   Comprehensive Evaluation &  Examination  This very nice 64 y.o. MWF presents for comprehensive evaluation, examination and management of multiple medical co-morbidities.  Patient has been followed for HTN, T2_NIDDM, Hyperlipidemia and Vitamin D Deficiency.    HTN predates since 2004. Patient's BP has been controlled at home and patient denies any cardiac symptoms as chest pain, palpitations, shortness of breath, dizziness or ankle swelling. Today's BP is 142/84.     Patient's hyperlipidemia is controlled with diet and medications. Patient denies myalgias or other medication SE's. Last lipids were at goal with  Cholesterol 152; HDL 44*; LDL 77; & Triglycerides 156 on 02/06/2015.    Patient has Morbid Obesity (BMI 30.81) and consequent T2_NIDDM predating since 2004 and patient denies reactive hypoglycemic symptoms, visual blurring, diabetic polys, but she is felt to have early signs of peripheral sensory neuropathy with c/o numb & tingling paresthesias of her toes and soles of her feet. Reports CBG's range betw 100-140 mg%. Last A1c was 6.2% on 02/06/2015. Patient desires to retry Phentermine as she's gained weight back since stopping.    Finally, patient has history of Vitamin D Deficiency and last Vitamin D was  82 on 02/06/2015.     Medication Sig  . aspirin 81 MG chewable tablet Chew by mouth daily.  . bisoprolol-hctz [facility]) 5-6.25  TAKE 1 TABLET EVERY DAY  . cetirizine  10 MG tablet Take 10 mg by mouth daily.  Marland Kitchen VITAMIN D Take 2,000 Int'l Units by mouth 2 (two) times daily.   Marland Kitchen SLOW FE 160 (50 FE) MG  Take 160 mg by mouth daily.  . hctz 25 MG tablet TAKE 1 TABLET (25 MG TOTAL) BY MOUTH DAILY. FOR BLOOD PRESSURE AND FLUID  . Magnesium 500 MG CAPS Take 500 mg by mouth daily.  . meclizine  25 MG tablet Take 1 tablet (25 mg total) by mouth 3 (three) times daily as needed for dizziness.  . metFORMIN -XR 500 MG 24 hr  tablet Take 500 mg 2 tabs twice daily.  . montelukast  10 MG tablet TAKE ONE TABLET BY MOUTH ONCE DAILY FOR  ALLERGIES  . hair,skin and nails  supplement  2 times daily  . promethazine  25 MG tablet TAKE 1 TABLET BY MOUTH AS NEEDED DIZZINESS  . simvastatin (ZOCOR) 20 MG tablet TAKE 1 TABLET AT BEDTIME FOR CHOLESTEROL  . phentermine (ADIPEX-P) 37.5 MG tablet 1/2 to 1 tablet daily for weight loss   Allergies  Allergen Reactions  . Penicillins    Past Medical History  Diagnosis Date  . Hyperlipidemia   . Hypertension   . Type II or unspecified type diabetes mellitus without mention of complication, not stated as uncontrolled   . Neuropathy   . Allergy   . Iron deficiency   . Vitamin D deficiency   . Meniere's disease    Health Maintenance  Topic Date Due  . FOOT EXAM  08/04/1959  . OPHTHALMOLOGY EXAM  08/04/1959  . PAP SMEAR  08/03/1970  . DEXA SCAN  08/03/2014  . URINE MICROALBUMIN  04/15/2015  . INFLUENZA VACCINE  04/20/2015  . HEMOGLOBIN A1C  08/09/2015  . PNA vac Low Risk Adult (2 of 2 - PPSV23) 11/01/2015  . MAMMOGRAM  01/28/2017  . COLONOSCOPY  03/28/2019  . TETANUS/TDAP  04/05/2021  . ZOSTAVAX  Completed  . Hepatitis C Screening  Completed  . HIV Screening  Completed  Immunization History  Administered Date(s) Administered  . Influenza Split 07/16/2013, 07/24/2014  . Influenza, High Dose Seasonal PF 06/15/2015  . PPD Test 04/14/2014  . Pneumococcal Conjugate-13 10/31/2014  . Pneumococcal Polysaccharide-23 12/01/2004  . Tdap 04/06/2011  . Zoster 07/16/2013   Past Surgical History  Procedure Laterality Date  . Orif ankle fracture Left 2000  . Abdominal hysterectomy  1998    BSO   Family History  Problem Relation Age of Onset  . Cancer Mother     PANCREAS  . Hypertension Father   . Heart disease Father   . Diabetes Father   . Heart disease Son    Social History  Substance Use Topics  . Smoking status: Never Smoker   . Smokeless tobacco: None  .  Alcohol Use: Yes     Comment: rarely    ROS Constitutional: Denies fever, chills, weight loss/gain, headaches, insomnia,  night sweats, and change in appetite. Does c/o fatigue. Eyes: Denies redness, blurred vision, diplopia, discharge, itchy, watery eyes.  ENT: Denies discharge, congestion, post nasal drip, epistaxis, sore throat, earache, hearing loss, dental pain, Tinnitus, Vertigo, Sinus pain, snoring.  Cardio: Denies chest pain, palpitations, irregular heartbeat, syncope, dyspnea, diaphoresis, orthopnea, PND, claudication, edema Respiratory: denies cough, dyspnea, DOE, pleurisy, hoarseness, laryngitis, wheezing.  Gastrointestinal: Denies dysphagia, heartburn, reflux, water brash, pain, cramps, nausea, vomiting, bloating, diarrhea, constipation, hematemesis, melena, hematochezia, jaundice, hemorrhoids Genitourinary: Denies dysuria, frequency, urgency, nocturia, hesitancy, discharge, hematuria, flank pain Breast: Breast lumps, nipple discharge, bleeding.  Musculoskeletal: Denies arthralgia, myalgia, stiffness, Jt. Swelling, pain, limp, and strain/sprain. Denies falls. Skin: Denies puritis, rash, hives, warts, acne, eczema, changing in skin lesion Neuro: No weakness, tremor, incoordination, spasms, pain, but doe report non-painful paresthesias of the feet & toes.  Psychiatric: Denies confusion, memory loss, sensory loss. Denies Depression. Endocrine: Denies change in weight, skin, hair change, nocturia, diabetic polys, visual blurring, hyper / hypo glycemic episodes.  Heme/Lymph: No excessive bleeding, bruising, enlarged lymph nodes.  Physical Exam  BP 142/84 mmHg  Pulse 76  Temp(Src) 97.5 F (36.4 C)  Resp 16  Ht 5' 0.5" (1.537 m)  Wt 160 lb 6.4 oz (72.757 kg)  BMI 30.80 kg/m2  General Appearance: Over nourished with central Obesity  and in no apparent distress. Eyes: PERRLA, EOMs, conjunctiva no swelling or erythema, normal fundi and vessels. Sinuses: No frontal/maxillary  tenderness ENT/Mouth: EACs patent / TMs  nl. Nares clear without erythema, swelling, mucoid exudates. Oral hygiene is good. No erythema, swelling, or exudate. Tongue normal, non-obstructing. Tonsils not swollen or erythematous. Hearing normal.  Neck: Supple, thyroid normal. No bruits, nodes or JVD. Respiratory: Respiratory effort normal.  BS equal and clear bilateral without rales, rhonci, wheezing or stridor. Cardio: Heart sounds are normal with regular rate and rhythm and no murmurs, rubs or gallops. Peripheral pulses are normal and equal bilaterally without edema. No aortic or femoral bruits. Chest: symmetric with normal excursions and percussion. Breasts: Symmetric, without lumps, nipple discharge, retractions, or fibrocystic changes.  Abdomen: Flat, soft, with bowel sounds. Nontender, no guarding, rebound, hernias, masses, or organomegaly.  Lymphatics: Non tender without lymphadenopathy.  Genitourinary:  Musculoskeletal: Full ROM all peripheral extremities, joint stability, 5/5 strength, and normal gait. Skin: Warm and dry without rashes, lesions, cyanosis, clubbing or  ecchymosis.  Neuro: Cranial nerves intact, reflexes equal bilaterally. Normal muscle tone, no cerebellar symptoms. Sensation intact to touch and monofilament testing to the toes bilaterally.  Pysch: Alert and oriented X 3, normal affect, Insight and Judgment appropriate.   Assessment  and Plan  1. Essential hypertension  - EKG 12-Lead - [place], RETROPERITNL ABD,  LTD - TSH  2. Hyperlipidemia  - Lipid panel  3. Type 2 diabetes mellitus with peripheral sensory neuropathy  - Microalbumin / creatinine urine ratio - HM DIABETES FOOT EXAM - LOW EXTREMITY NEUR EXAM DOCUM - Hemoglobin A1c - Insulin, random  4. Need for prophylactic vaccination and inoculation against influenza  - Flu vaccine HIGH DOSE PF (Fluzone High dose)  5. Vitamin D deficiency  - Vit D  25 hydroxy   6. BMI 30.0-30.9,adult   7. Morbid  obesity   8. Screening for rectal cancer  - POC Hemoccult Bld/Stl   9. Depression screen   10. At low risk for fall   11. Medication management  - Urinalysis, Routine w reflex microscopic - CBC with Differential/Platelet - BASIC METABOLIC PANEL WITH GFR - Hepatic function panel - Magnesium   Continue prudent diet as discussed, weight control, BP monitoring, regular exercise, and medications. Discussed med's effects and SE's. Screening labs and tests as requested with regular follow-up as recommended.  Over 40 minutes of exam, counseling, chart review was performed.

## 2015-06-16 LAB — MICROALBUMIN / CREATININE URINE RATIO
Creatinine, Urine: 72.9 mg/dL
MICROALB/CREAT RATIO: 4.1 mg/g (ref 0.0–30.0)
Microalb, Ur: 0.3 mg/dL (ref <2.0)

## 2015-06-16 LAB — URINALYSIS, ROUTINE W REFLEX MICROSCOPIC
BILIRUBIN URINE: NEGATIVE
Glucose, UA: NEGATIVE
HGB URINE DIPSTICK: NEGATIVE
KETONES UR: NEGATIVE
Leukocytes, UA: NEGATIVE
Nitrite: NEGATIVE
PROTEIN: NEGATIVE
Specific Gravity, Urine: 1.011 (ref 1.001–1.035)
pH: 7 (ref 5.0–8.0)

## 2015-06-16 LAB — HEMOGLOBIN A1C
Hgb A1c MFr Bld: 6.3 % — ABNORMAL HIGH (ref <5.7)
MEAN PLASMA GLUCOSE: 134 mg/dL — AB (ref <117)

## 2015-06-16 LAB — TSH: TSH: 1.814 u[IU]/mL (ref 0.350–4.500)

## 2015-06-16 LAB — VITAMIN D 25 HYDROXY (VIT D DEFICIENCY, FRACTURES): VIT D 25 HYDROXY: 70 ng/mL (ref 30–100)

## 2015-06-16 LAB — INSULIN, RANDOM: INSULIN: 13.8 u[IU]/mL (ref 2.0–19.6)

## 2015-06-24 ENCOUNTER — Other Ambulatory Visit: Payer: Self-pay | Admitting: *Deleted

## 2015-06-24 DIAGNOSIS — Z1212 Encounter for screening for malignant neoplasm of rectum: Secondary | ICD-10-CM

## 2015-06-24 LAB — POC HEMOCCULT BLD/STL (HOME/3-CARD/SCREEN)
Card #2 Fecal Occult Blod, POC: NEGATIVE
Card #3 Fecal Occult Blood, POC: NEGATIVE
FECAL OCCULT BLD: NEGATIVE

## 2015-07-15 ENCOUNTER — Other Ambulatory Visit: Payer: Self-pay | Admitting: *Deleted

## 2015-07-15 MED ORDER — PHENTERMINE HCL 37.5 MG PO CAPS: 37.5000 mg | ORAL_CAPSULE | ORAL | Status: DC

## 2015-08-25 ENCOUNTER — Other Ambulatory Visit: Payer: Self-pay | Admitting: Internal Medicine

## 2015-09-18 ENCOUNTER — Ambulatory Visit (INDEPENDENT_AMBULATORY_CARE_PROVIDER_SITE_OTHER): Payer: Medicare Other | Admitting: Internal Medicine

## 2015-09-18 ENCOUNTER — Encounter: Payer: Self-pay | Admitting: Internal Medicine

## 2015-09-18 VITALS — BP 136/70 | HR 82 | Temp 98.0°F | Resp 16 | Ht 60.05 in | Wt 155.0 lb

## 2015-09-18 DIAGNOSIS — Z79899 Other long term (current) drug therapy: Secondary | ICD-10-CM

## 2015-09-18 DIAGNOSIS — E1140 Type 2 diabetes mellitus with diabetic neuropathy, unspecified: Secondary | ICD-10-CM | POA: Diagnosis not present

## 2015-09-18 DIAGNOSIS — E785 Hyperlipidemia, unspecified: Secondary | ICD-10-CM | POA: Diagnosis not present

## 2015-09-18 DIAGNOSIS — I10 Essential (primary) hypertension: Secondary | ICD-10-CM | POA: Diagnosis not present

## 2015-09-18 DIAGNOSIS — E559 Vitamin D deficiency, unspecified: Secondary | ICD-10-CM | POA: Diagnosis not present

## 2015-09-18 LAB — CBC WITH DIFFERENTIAL/PLATELET
Basophils Absolute: 0 10*3/uL (ref 0.0–0.1)
Basophils Relative: 0 % (ref 0–1)
EOS PCT: 3 % (ref 0–5)
Eosinophils Absolute: 0.3 10*3/uL (ref 0.0–0.7)
HEMATOCRIT: 38.4 % (ref 36.0–46.0)
Hemoglobin: 12.7 g/dL (ref 12.0–15.0)
LYMPHS ABS: 3.1 10*3/uL (ref 0.7–4.0)
LYMPHS PCT: 36 % (ref 12–46)
MCH: 26.7 pg (ref 26.0–34.0)
MCHC: 33.1 g/dL (ref 30.0–36.0)
MCV: 80.8 fL (ref 78.0–100.0)
MONO ABS: 0.5 10*3/uL (ref 0.1–1.0)
MONOS PCT: 6 % (ref 3–12)
MPV: 9.8 fL (ref 8.6–12.4)
Neutro Abs: 4.7 10*3/uL (ref 1.7–7.7)
Neutrophils Relative %: 55 % (ref 43–77)
Platelets: 275 10*3/uL (ref 150–400)
RBC: 4.75 MIL/uL (ref 3.87–5.11)
RDW: 14.4 % (ref 11.5–15.5)
WBC: 8.5 10*3/uL (ref 4.0–10.5)

## 2015-09-18 LAB — HEPATIC FUNCTION PANEL
ALBUMIN: 4.2 g/dL (ref 3.6–5.1)
ALT: 18 U/L (ref 6–29)
AST: 16 U/L (ref 10–35)
Alkaline Phosphatase: 68 U/L (ref 33–130)
Bilirubin, Direct: 0.1 mg/dL (ref <=0.2)
Indirect Bilirubin: 0.4 mg/dL (ref 0.2–1.2)
TOTAL PROTEIN: 6.9 g/dL (ref 6.1–8.1)
Total Bilirubin: 0.5 mg/dL (ref 0.2–1.2)

## 2015-09-18 LAB — BASIC METABOLIC PANEL WITH GFR
BUN: 12 mg/dL (ref 7–25)
CALCIUM: 9.6 mg/dL (ref 8.6–10.4)
CO2: 28 mmol/L (ref 20–31)
Chloride: 98 mmol/L (ref 98–110)
Creat: 0.62 mg/dL (ref 0.50–0.99)
GFR, Est Non African American: >89 mL/min (ref >=60)
GLUCOSE: 118 mg/dL — AB (ref 65–99)
Potassium: 3.6 mmol/L (ref 3.5–5.3)
Sodium: 136 mmol/L (ref 135–146)

## 2015-09-18 LAB — TSH: TSH: 2.695 u[IU]/mL (ref 0.350–4.500)

## 2015-09-18 LAB — LIPID PANEL
CHOL/HDL RATIO: 4 ratio (ref <=5.0)
CHOLESTEROL: 171 mg/dL (ref 125–200)
HDL: 43 mg/dL — AB (ref >=46)
LDL Cholesterol: 97 mg/dL (ref <130)
TRIGLYCERIDES: 157 mg/dL — AB (ref <150)
VLDL: 31 mg/dL — AB (ref <30)

## 2015-09-18 MED ORDER — GLUCOSE BLOOD VI STRP: ORAL_STRIP | Status: DC

## 2015-09-18 NOTE — Patient Instructions (Signed)
Please take 1 tablet of metformin in the morning. Please take 2 tablets of metformin at lunch time. Please take 1 tablet of metformin at dinner.

## 2015-09-18 NOTE — Progress Notes (Signed)
Patient ID: Bettyjane Kulhanek, female   DOB: [birth year], 64 y.o.   MRN: [id number]  Assessment and Plan:  Hypertension:  -Continue medication -monitor blood pressure at home. -Continue DASH diet -Reminder to go to the ER if any CP, SOB, nausea, dizziness, severe HA, changes vision/speech, left arm numbness and tingling and jaw pain.  Cholesterol - Continue diet and exercise -Check cholesterol.   Diabetes with diabetic chronic kidney disease -Continue diet and exercise.  -Check A1C  Vitamin D Def -check level -continue medications.   Continue diet and meds as discussed. Further disposition pending results of labs. Discussed med's effects and SE's.    HPI 64 y.o. female  presents for 3 month follow up with hypertension, hyperlipidemia, diabetes and vitamin D deficiency.   Her blood pressure has been controlled at home, today their BP is BP: 136/70 mmHg.She does not workout. She denies chest pain, shortness of breath, dizziness.  She reports that she is doing a lot of walking and taking care of grandchildren.     She is on cholesterol medication and denies myalgias. Her cholesterol is at goal. The cholesterol was:  06/15/2015: Cholesterol 153; HDL 40*; LDL Cholesterol 81; Triglycerides 159*   She has been working on diet and exercise for diabetes with diabetic chronic kidney disease, she is on bASA, she is on ACE/ARB, and denies  foot ulcerations, hyperglycemia, hypoglycemia , increased appetite, nausea, paresthesia of the feet, polydipsia, polyuria, visual disturbances, vomiting and weight loss. Last A1C was: 06/15/2015: Hgb A1c MFr Bld 6.3* .  Sugars are running 90-130.  She reports that occasionally has lower sugars.  She reports that it has only dropped below 70 once.     Patient is on Vitamin D supplement. 06/15/2015: Vit D, 25-Hydroxy 70    Current Medications:  Current Outpatient Prescriptions on File Prior to Visit  Medication Sig Dispense Refill  . aspirin 81 MG chewable  tablet Chew by mouth daily.    Marland Kitchen BAYER MICROLET LANCETS lancets Use as instructed 100 each 12  . bisoprolol-hydrochlorothiazide (ZIAC) 5-6.25 MG per tablet TAKE 1 TABLET EVERY DAY 90 tablet 1  . Blood Glucose Monitoring Suppl (CONTOUR BLOOD GLUCOSE SYSTEM) DEVI Test Blood sugar once daily 1 Device 0  . cetirizine (ZYRTEC) 10 MG tablet Take 10 mg by mouth daily.    . Cholecalciferol (VITAMIN D PO) Take 6,000 Int'l Units by mouth daily.     . ferrous sulfate dried (SLOW FE) 160 (50 FE) MG TBCR SR tablet Take 160 mg by mouth daily.    . hydrochlorothiazide (HYDRODIURIL) 25 MG tablet TAKE 1 TABLET (25 MG TOTAL) BY MOUTH DAILY. FOR BLOOD PRESSURE AND FLUID 90 tablet 3  . Magnesium 500 MG CAPS Take 500 mg by mouth daily.    . meclizine (ANTIVERT) 25 MG tablet Take 1 tablet (25 mg total) by mouth 3 (three) times daily as needed for dizziness. 30 tablet 0  . metFORMIN (GLUCOPHAGE-XR) 500 MG 24 hr tablet TAKE 2 TABLETS TWICE DAILY 360 tablet 1  . montelukast (SINGULAIR) 10 MG tablet TAKE ONE TABLET BY MOUTH ONCE DAILY FOR  ALLERGIES 90 tablet 1  . OVER THE COUNTER MEDICATION One Source supplement 2 times daily for hair,skin and nails    . phentermine 37.5 MG capsule Take 1 capsule (37.5 mg total) by mouth every morning. 30 capsule 5  . promethazine (PHENERGAN) 25 MG tablet TAKE 1 TABLET BY MOUTH AS NEEDED DIZZINESS 30 tablet 0  . simvastatin (ZOCOR) 20 MG tablet TAKE 1 TABLET  AT BEDTIME FOR CHOLESTEROL 90 tablet 1   No current facility-administered medications on file prior to visit.   Medical History:  Past Medical History  Diagnosis Date  . Hyperlipidemia   . Hypertension   . Type II or unspecified type diabetes mellitus without mention of complication, not stated as uncontrolled   . Neuropathy ([place])   . Allergy   . Iron deficiency   . Vitamin D deficiency   . Meniere's disease    Allergies:  Allergies  Allergen Reactions  . Penicillins      Review of Systems:  Review of Systems   Constitutional: Negative for fever, chills and malaise/fatigue.  HENT: Negative for congestion, ear pain and sore throat.   Respiratory: Negative for cough, shortness of breath and wheezing.   Cardiovascular: Negative for chest pain, palpitations and leg swelling.  Gastrointestinal: Positive for heartburn and diarrhea. Negative for abdominal pain, constipation, blood in stool and melena.  Genitourinary: Negative.   Skin: Negative.   Neurological: Negative for dizziness, sensory change, loss of consciousness and headaches.  Psychiatric/Behavioral: Negative for depression. The patient is not nervous/anxious and does not have insomnia.     Family history- Review and unchanged  Social history- Review and unchanged  Physical Exam: BP 136/70 mmHg  Pulse 82  Temp(Src) 98 F (36.7 C) (Temporal)  Resp 16  Ht 5' 0.05" (1.525 m)  Wt 155 lb (70.308 kg)  BMI 30.23 kg/m2 Wt Readings from Last 3 Encounters:  09/18/15 155 lb (70.308 kg)  06/15/15 160 lb 6.4 oz (72.757 kg)  02/06/15 157 lb (71.215 kg)   General Appearance: Well nourished well developed, non-toxic appearing, in no apparent distress. Eyes: PERRLA, EOMs, conjunctiva no swelling or erythema ENT/Mouth: Ear canals clear with no erythema, swelling, or discharge.  TMs normal bilaterally, oropharynx clear, moist, with no exudate.   Neck: Supple, thyroid normal, no JVD, no cervical adenopathy.  Respiratory: Respiratory effort normal, breath sounds clear A&P, no wheeze, rhonchi or rales noted.  No retractions, no accessory muscle usage Cardio: RRR with no MRGs. No noted edema.  Abdomen: Soft, + BS.  Non tender, no guarding, rebound, hernias, masses. Musculoskeletal: Full ROM, 5/5 strength, Normal gait Skin: Warm, dry without rashes, lesions, ecchymosis.  Neuro: Awake and oriented X 3, Cranial nerves intact. No cerebellar symptoms.  Psych: normal affect, Insight and Judgment appropriate.    Starlyn Skeans, PA-C 8:58 AM [facility]

## 2015-09-19 LAB — HEMOGLOBIN A1C
Hgb A1c MFr Bld: 6.2 % — ABNORMAL HIGH (ref <5.7)
MEAN PLASMA GLUCOSE: 131 mg/dL — AB (ref <117)

## 2015-09-24 ENCOUNTER — Other Ambulatory Visit: Payer: Self-pay | Admitting: Internal Medicine

## 2015-09-24 MED ORDER — GLUCOSE BLOOD VI STRP: ORAL_STRIP | Status: DC

## 2015-10-26 ENCOUNTER — Other Ambulatory Visit: Payer: Self-pay | Admitting: Internal Medicine

## 2015-12-21 ENCOUNTER — Other Ambulatory Visit: Payer: Self-pay | Admitting: Internal Medicine

## 2015-12-22 ENCOUNTER — Encounter: Payer: Self-pay | Admitting: Internal Medicine

## 2015-12-22 ENCOUNTER — Ambulatory Visit (INDEPENDENT_AMBULATORY_CARE_PROVIDER_SITE_OTHER): Payer: Medicare Other | Admitting: Internal Medicine

## 2015-12-22 VITALS — BP 118/68 | HR 80 | Temp 97.2°F | Resp 16 | Ht 60.5 in | Wt 153.6 lb

## 2015-12-22 DIAGNOSIS — E559 Vitamin D deficiency, unspecified: Secondary | ICD-10-CM

## 2015-12-22 DIAGNOSIS — E1140 Type 2 diabetes mellitus with diabetic neuropathy, unspecified: Secondary | ICD-10-CM | POA: Diagnosis not present

## 2015-12-22 DIAGNOSIS — I10 Essential (primary) hypertension: Secondary | ICD-10-CM | POA: Diagnosis not present

## 2015-12-22 DIAGNOSIS — E785 Hyperlipidemia, unspecified: Secondary | ICD-10-CM | POA: Diagnosis not present

## 2015-12-22 DIAGNOSIS — Z79899 Other long term (current) drug therapy: Secondary | ICD-10-CM | POA: Diagnosis not present

## 2015-12-22 LAB — CBC WITH DIFFERENTIAL/PLATELET
BASOS ABS: 0 {cells}/uL (ref 0–200)
BASOS PCT: 0 %
EOS ABS: 288 {cells}/uL (ref 15–500)
EOS PCT: 3 %
HCT: 38.9 % (ref 35.0–45.0)
Hemoglobin: 13.2 g/dL (ref 11.7–15.5)
LYMPHS PCT: 34 %
Lymphs Abs: 3264 cells/uL (ref 850–3900)
MCH: 28 pg (ref 27.0–33.0)
MCHC: 33.9 g/dL (ref 32.0–36.0)
MCV: 82.4 fL (ref 80.0–100.0)
MONOS PCT: 5 %
MPV: 9.8 fL (ref 7.5–12.5)
Monocytes Absolute: 480 cells/uL (ref 200–950)
Neutro Abs: 5568 cells/uL (ref 1500–7800)
Neutrophils Relative %: 58 %
PLATELETS: 272 10*3/uL (ref 140–400)
RBC: 4.72 MIL/uL (ref 3.80–5.10)
RDW: 14.2 % (ref 11.0–15.0)
WBC: 9.6 10*3/uL (ref 3.8–10.8)

## 2015-12-22 LAB — BASIC METABOLIC PANEL WITH GFR
BUN: 15 mg/dL (ref 7–25)
CO2: 28 mmol/L (ref 20–31)
CREATININE: 0.54 mg/dL (ref 0.50–0.99)
Calcium: 9.6 mg/dL (ref 8.6–10.4)
Chloride: 98 mmol/L (ref 98–110)
GFR, Est Non African American: >89 mL/min (ref >=60)
Glucose, Bld: 111 mg/dL — ABNORMAL HIGH (ref 65–99)
Potassium: 3.8 mmol/L (ref 3.5–5.3)
SODIUM: 138 mmol/L (ref 135–146)

## 2015-12-22 LAB — LIPID PANEL
CHOL/HDL RATIO: 3.8 ratio (ref <=5.0)
CHOLESTEROL: 156 mg/dL (ref 125–200)
HDL: 41 mg/dL — AB (ref >=46)
LDL CALC: 88 mg/dL (ref <130)
TRIGLYCERIDES: 134 mg/dL (ref <150)
VLDL: 27 mg/dL (ref <30)

## 2015-12-22 LAB — HEMOGLOBIN A1C
Hgb A1c MFr Bld: 6.3 % — ABNORMAL HIGH (ref <5.7)
Mean Plasma Glucose: 134 mg/dL

## 2015-12-22 LAB — HEPATIC FUNCTION PANEL
ALK PHOS: 80 U/L (ref 33–130)
ALT: 17 U/L (ref 6–29)
AST: 17 U/L (ref 10–35)
Albumin: 4.3 g/dL (ref 3.6–5.1)
BILIRUBIN DIRECT: 0.1 mg/dL (ref <=0.2)
BILIRUBIN INDIRECT: 0.3 mg/dL (ref 0.2–1.2)
BILIRUBIN TOTAL: 0.4 mg/dL (ref 0.2–1.2)
TOTAL PROTEIN: 7.1 g/dL (ref 6.1–8.1)

## 2015-12-22 LAB — TSH: TSH: 2.85 mIU/L

## 2015-12-22 LAB — MAGNESIUM: Magnesium: 1.9 mg/dL (ref 1.5–2.5)

## 2015-12-22 NOTE — Patient Instructions (Signed)

## 2015-12-22 NOTE — Progress Notes (Signed)
Patient ID: Regina Cervantes, female   DOB: 21-Nov-1950, 65 y.o.   MRN: [id number]   This very nice 65 y.o. MWF presents for 6 month follow up with Hypertension, Hyperlipidemia, Pre-Diabetes and Vitamin D Deficiency.    Patient is treated for HTN circa 2004 & BP has been controlled at home. Today's BP: 118/68 mmHg. Patient has had no complaints of any cardiac type chest pain, palpitations, dyspnea/orthopnea/PND, dizziness, claudication, or dependent edema.   Hyperlipidemia is controlled with diet & meds. Patient denies myalgias or other med SE's. Last Lipids were  Cholesterol 171; HDL 43*; LDL 97; Triglycerides 157 on 09/18/2015.   Also, the patient has history of T2_NIDDM circa 2004 and reports CBG's range betw 99-130 mg nd has had no symptoms of reactive hypoglycemia, diabetic polys, paresthesias or visual blurring.  Last A1c was  6.2% on 09/18/2015.    Further, the patient also has history of Vitamin D Deficiency and supplements vitamin D without any suspected side-effects. Last vitamin D was 70 on 06/15/2015.  Medication Sig  . aspirin 81 MG chewable tablet Chew by mouth daily.  . bisoprolol-hctz (ZIAC) 5-6.25 MG tablet TAKE 1 TABLET EVERY DAY  . cetirizine (ZYRTEC) 10 MG tablet Take 10 mg by mouth daily.  Marland Kitchen VITAMIN D  Take 6,000 Int'l Units by mouth daily.   Marland Kitchen SLOW FE ) MG TBCR SR Take 160 mg by mouth daily.  . hydrochlorothiazide  25 MG tablet TAKE 1 TABLET (25 MG TOTAL) BY MOUTH DAILY. FOR BLOOD PRESSURE AND FLUID  . Magnesium 500 MG CAPS Take 500 mg by mouth daily.  . meclizine (ANTIVERT) 25 MG tablet Take 1 tablet (25 mg total) by mouth 3 (three) times daily as needed for dizziness.  . metFORMIN-XR 500 MG 24 hr tablet TAKE 2 TABLETS TWICE DAILY  . montelukast (SINGULAIR) 10 MG tablet TAKE 1 TABLET EVERY DAY  FOR  ALLERGIES  . OVER THE COUNTER MEDICATION One Source supplement 2 times daily for hair,skin and nails  . promethazine (PHENERGAN) 25 MG tablet TAKE 1 TABLET BY MOUTH AS NEEDED  DIZZINESS  . simvastatin (ZOCOR) 20 MG tablet TAKE 1 TABLET AT BEDTIME FOR CHOLESTEROL  . phentermine 37.5 MG capsule Take 1 capsule (37.5 mg total) by mouth every morning.   Allergies  Allergen Reactions  . Penicillins    PMHx:   Past Medical History  Diagnosis Date  . Hyperlipidemia   . Hypertension   . Type II or unspecified type diabetes mellitus without mention of complication, not stated as uncontrolled   . Neuropathy ([place])   . Allergy   . Iron deficiency   . Vitamin D deficiency   . Meniere's disease    Immunization History  Administered Date(s) Administered  . Influenza Split 07/16/2013, 07/24/2014  . Influenza, High Dose Seasonal PF 06/15/2015  . PPD Test 04/14/2014  . Pneumococcal Conjugate-13 10/31/2014  . Pneumococcal Polysaccharide-23 12/01/2004  . Tdap 04/06/2011  . Zoster 07/16/2013   Past Surgical History  Procedure Laterality Date  . Orif ankle fracture Left 2000  . Abdominal hysterectomy  1998    BSO   FHx:    Reviewed / unchanged  SHx:    Reviewed / unchanged  Systems Review:  Constitutional: Denies fever, chills, wt changes, headaches, insomnia, fatigue, night sweats, change in appetite. Eyes: Denies redness, blurred vision, diplopia, discharge, itchy, watery eyes.  ENT: Denies discharge, congestion, post nasal drip, epistaxis, sore throat, earache, hearing loss, dental pain, tinnitus, vertigo, sinus pain, snoring.  CV: Denies chest  pain, palpitations, irregular heartbeat, syncope, dyspnea, diaphoresis, orthopnea, PND, claudication or edema. Respiratory: denies cough, dyspnea, DOE, pleurisy, hoarseness, laryngitis, wheezing.  Gastrointestinal: Denies dysphagia, odynophagia, heartburn, reflux, water brash, abdominal pain or cramps, nausea, vomiting, bloating, diarrhea, constipation, hematemesis, melena, hematochezia  or hemorrhoids. Genitourinary: Denies dysuria, frequency, urgency, nocturia, hesitancy, discharge, hematuria or flank  pain. Musculoskeletal: Denies arthralgias, myalgias, stiffness, jt. swelling, pain, limping or strain/sprain.  Skin: Denies pruritus, rash, hives, warts, acne, eczema or change in skin lesion(s). Neuro: No weakness, tremor, incoordination, spasms, paresthesia or pain. Psychiatric: Denies confusion, memory loss or sensory loss. Endo: Denies change in weight, skin or hair change.  Heme/Lymph: No excessive bleeding, bruising or enlarged lymph nodes.  Physical Exam  BP 118/68 mmHg  Pulse 80  Temp(Src) 97.2 F (36.2 C)  Resp 16  Ht 5' 0.5" (1.537 m)  Wt 153 lb 9.6 oz (69.673 kg)  BMI 29.49 kg/m2  Appears well nourished and in no distress. Eyes: PERRLA, EOMs, conjunctiva no swelling or erythema. Sinuses: No frontal/maxillary tenderness ENT/Mouth: EAC's clear, TM's nl w/o erythema, bulging. Nares clear w/o erythema, swelling, exudates. Oropharynx clear without erythema or exudates. Oral hygiene is good. Tongue normal, non obstructing. Hearing intact.  Neck: Supple. Thyroid nl. Car 2+/2+ without bruits, nodes or JVD. Chest: Respirations nl with BS clear & equal w/o rales, rhonchi, wheezing or stridor.  Cor: Heart sounds normal w/ regular rate and rhythm without sig. murmurs, gallops, clicks, or rubs. Peripheral pulses normal and equal  without edema.  Abdomen: Soft & bowel sounds normal. Non-tender w/o guarding, rebound, hernias, masses, or organomegaly.  Lymphatics: Unremarkable.  Musculoskeletal: Full ROM all peripheral extremities, joint stability, 5/5 strength, and normal gait.  Skin: Warm, dry without exposed rashes, lesions or ecchymosis apparent.  Neuro: Cranial nerves intact, reflexes equal bilaterally. Sensory-motor testing grossly intact. Tendon reflexes grossly intact.  Pysch: Alert & oriented x 3.  Insight and judgement nl & appropriate. No ideations.  Assessment and Plan:  1. Essential hypertension  - TSH  2. Hyperlipidemia  - Lipid panel - TSH  3. Type 2 diabetes  mellitus with sensory neuropathy (H[facility])  - Hemoglobin A1c - Insulin, random  4. Vitamin D deficiency  - VITAMIN D 25 Hydroxy   5. Medication management  - CBC with Differential/Platelet - BASIC METABOLIC PANEL WITH GFR - Hepatic function panel - Magnesium   Recommended regular exercise, BP monitoring, weight control, and discussed med and SE's. Recommended labs to assess and monitor clinical status. Further disposition pending results of labs. Over 30 minutes of exam, counseling, chart review was performed

## 2015-12-23 LAB — VITAMIN D 25 HYDROXY (VIT D DEFICIENCY, FRACTURES): Vit D, 25-Hydroxy: 80 ng/mL (ref 30–100)

## 2015-12-23 LAB — INSULIN, RANDOM: Insulin: 14 u[IU]/mL (ref 2.0–19.6)

## 2016-01-12 ENCOUNTER — Other Ambulatory Visit: Payer: Self-pay

## 2016-01-12 DIAGNOSIS — Z1231 Encounter for screening mammogram for malignant neoplasm of breast: Secondary | ICD-10-CM

## 2016-02-01 ENCOUNTER — Other Ambulatory Visit: Payer: Self-pay | Admitting: Internal Medicine

## 2016-02-10 ENCOUNTER — Other Ambulatory Visit: Payer: Self-pay | Admitting: Internal Medicine

## 2016-02-19 ENCOUNTER — Ambulatory Visit
Admission: RE | Admit: 2016-02-19 | Discharge: 2016-02-19 | Disposition: A | Discharge status: Home / Self Care | Payer: Medicare Other | Source: Ambulatory Visit

## 2016-02-19 DIAGNOSIS — Z1231 Encounter for screening mammogram for malignant neoplasm of breast: Secondary | ICD-10-CM

## 2016-03-08 ENCOUNTER — Other Ambulatory Visit: Payer: Self-pay | Admitting: Internal Medicine

## 2016-03-09 ENCOUNTER — Other Ambulatory Visit: Payer: Self-pay | Admitting: Internal Medicine

## 2016-03-14 ENCOUNTER — Other Ambulatory Visit: Payer: Self-pay | Admitting: Internal Medicine

## 2016-03-23 ENCOUNTER — Ambulatory Visit (INDEPENDENT_AMBULATORY_CARE_PROVIDER_SITE_OTHER): Payer: Medicare Other | Admitting: Physician Assistant

## 2016-03-23 ENCOUNTER — Encounter: Payer: Self-pay | Admitting: Physician Assistant

## 2016-03-23 VITALS — BP 130/80 | HR 84 | Temp 97.5°F | Resp 16 | Ht 60.5 in | Wt 153.0 lb

## 2016-03-23 DIAGNOSIS — E2839 Other primary ovarian failure: Secondary | ICD-10-CM | POA: Diagnosis not present

## 2016-03-23 DIAGNOSIS — Z0000 Encounter for general adult medical examination without abnormal findings: Secondary | ICD-10-CM

## 2016-03-23 DIAGNOSIS — E785 Hyperlipidemia, unspecified: Secondary | ICD-10-CM | POA: Diagnosis not present

## 2016-03-23 DIAGNOSIS — H8109 Meniere's disease, unspecified ear: Secondary | ICD-10-CM | POA: Diagnosis not present

## 2016-03-23 DIAGNOSIS — E559 Vitamin D deficiency, unspecified: Secondary | ICD-10-CM

## 2016-03-23 DIAGNOSIS — Z79899 Other long term (current) drug therapy: Secondary | ICD-10-CM

## 2016-03-23 DIAGNOSIS — Z23 Encounter for immunization: Secondary | ICD-10-CM | POA: Diagnosis not present

## 2016-03-23 DIAGNOSIS — E1140 Type 2 diabetes mellitus with diabetic neuropathy, unspecified: Secondary | ICD-10-CM

## 2016-03-23 DIAGNOSIS — R6889 Other general symptoms and signs: Secondary | ICD-10-CM | POA: Diagnosis not present

## 2016-03-23 DIAGNOSIS — Z0001 Encounter for general adult medical examination with abnormal findings: Secondary | ICD-10-CM

## 2016-03-23 DIAGNOSIS — I10 Essential (primary) hypertension: Secondary | ICD-10-CM

## 2016-03-23 LAB — LIPID PANEL
CHOLESTEROL: 139 mg/dL (ref 125–200)
HDL: 44 mg/dL — ABNORMAL LOW (ref >=46)
LDL Cholesterol: 72 mg/dL (ref <130)
Total CHOL/HDL Ratio: 3.2 Ratio (ref <=5.0)
Triglycerides: 114 mg/dL (ref <150)
VLDL: 23 mg/dL (ref <30)

## 2016-03-23 LAB — CBC WITH DIFFERENTIAL/PLATELET
BASOS PCT: 0 %
Basophils Absolute: 0 cells/uL (ref 0–200)
Eosinophils Absolute: 180 cells/uL (ref 15–500)
Eosinophils Relative: 2 %
HEMATOCRIT: 37 % (ref 35.0–45.0)
HEMOGLOBIN: 12.2 g/dL (ref 11.7–15.5)
LYMPHS ABS: 3150 {cells}/uL (ref 850–3900)
Lymphocytes Relative: 35 %
MCH: 26.4 pg — ABNORMAL LOW (ref 27.0–33.0)
MCHC: 33 g/dL (ref 32.0–36.0)
MCV: 80.1 fL (ref 80.0–100.0)
MONO ABS: 450 {cells}/uL (ref 200–950)
MPV: 10.1 fL (ref 7.5–12.5)
Monocytes Relative: 5 %
Neutro Abs: 5220 cells/uL (ref 1500–7800)
Neutrophils Relative %: 58 %
Platelets: 278 10*3/uL (ref 140–400)
RBC: 4.62 MIL/uL (ref 3.80–5.10)
RDW: 14.7 % (ref 11.0–15.0)
WBC: 9 10*3/uL (ref 3.8–10.8)

## 2016-03-23 LAB — BASIC METABOLIC PANEL WITH GFR
BUN: 11 mg/dL (ref 7–25)
CO2: 28 mmol/L (ref 20–31)
Calcium: 9.4 mg/dL (ref 8.6–10.4)
Chloride: 99 mmol/L (ref 98–110)
Creat: 0.6 mg/dL (ref 0.50–0.99)
GLUCOSE: 111 mg/dL — AB (ref 65–99)
POTASSIUM: 3.5 mmol/L (ref 3.5–5.3)
Sodium: 137 mmol/L (ref 135–146)

## 2016-03-23 LAB — TSH: TSH: 1.93 m[IU]/L

## 2016-03-23 LAB — HEPATIC FUNCTION PANEL
ALK PHOS: 83 U/L (ref 33–130)
ALT: 17 U/L (ref 6–29)
AST: 18 U/L (ref 10–35)
Albumin: 4.3 g/dL (ref 3.6–5.1)
BILIRUBIN INDIRECT: 0.4 mg/dL (ref 0.2–1.2)
Bilirubin, Direct: 0.1 mg/dL (ref <=0.2)
TOTAL PROTEIN: 6.9 g/dL (ref 6.1–8.1)
Total Bilirubin: 0.5 mg/dL (ref 0.2–1.2)

## 2016-03-23 LAB — HEMOGLOBIN A1C
Hgb A1c MFr Bld: 6.3 % — ABNORMAL HIGH (ref <5.7)
MEAN PLASMA GLUCOSE: 134 mg/dL

## 2016-03-23 LAB — MAGNESIUM: MAGNESIUM: 1.7 mg/dL (ref 1.5–2.5)

## 2016-03-23 NOTE — Progress Notes (Signed)
MEDICARE ANNUAL WELLNESS VISIT AND FOLLOW UP  Assessment:   1. Essential hypertension - continue medications, DASH diet, exercise and monitor at home. Call if greater than 130/80.  - CBC with Differential/Platelet - BASIC METABOLIC PANEL WITH GFR - Hepatic function panel - TSH  2. Type 2 diabetes mellitus with sensory neuropathy (HCC) Discussed general issues about diabetes pathophysiology and management., Educational material distributed., Suggested low cholesterol diet., Encouraged aerobic exercise., Discussed foot care., Reminded to get yearly retinal exam. - Hemoglobin A1c - declines ACE at this time, in PreDM range, if worsening A1C/protein in urine will add ACE/ARB  3. Morbid obesity, unspecified obesity type ([place]) Obesity with co morbidities- long discussion about weight loss, diet, and exercise - Lipid panel - Hemoglobin A1c  4. Hyperlipidemia -continue medications, check lipids, decrease fatty foods, increase activity.  - Lipid panel  5. Vitamin D deficiency Continue supplement  6. Medication management - Magnesium  7. Meniere's disease, unspecified laterality Continue HCTZ, monitor kidney function  8. Medicare annual wellness visit, subsequent  9. Estrogen deficiency - DG Bone Density; Future  10. Need for prophylactic vaccination against Streptococcus pneumoniae (pneumococcus) - Pneumococcal polysaccharide vaccine 23-valent greater than or equal to 2yo subcutaneous/IM  Over 30 minutes of exam, counseling, chart review, and critical decision making was performed  Future Appointments Date Time Provider [place]  07/04/2016 9:00 AM [name], MD [facility] None    Plan:   During the course of the visit the patient was educated and counseled about appropriate screening and preventive services including:    Pneumococcal vaccine   Influenza vaccine  Td vaccine  Prevnar 13  Screening electrocardiogram  Screening mammography  Bone  densitometry screening  Colorectal cancer screening  Diabetes screening  Glaucoma screening  Nutrition counseling   Advanced directives: given info/requested copies   Subjective:   Regina Cervantes is a 65 y.o. female who presents for Medicare Annual Wellness Visit and 3 month follow up on hypertension, diabetes with DM neuropathy, hyperlipidemia, vitamin D def.   Her blood pressure has been controlled at home, today their BP is BP: 130/80 mmHg  She does not workout regularly, walks 1-2 x a week for 15-20 mins. She denies chest pain, shortness of breath, dizziness. She has seen Dr. Lucia Gaskins for her menere's, she is back on her HCTZ due to recent attacks but she has been doing better.   She is on cholesterol medication and denies myalgias. Her cholesterol is at goal. The cholesterol last visit was:   Lab Results  Component Value Date   CHOL 156 12/22/2015   HDL 41* 12/22/2015   LDLCALC 88 12/22/2015   TRIG 134 12/22/2015   CHOLHDL 3.8 12/22/2015    She has been working on diet and exercise for diabetes with DM neuropathy, she is on bASA, she is not on ACE patient declines at this time, she is on metformin, and denies polydipsia, polyuria and visual disturbances. Last A1C in the office was:  Lab Results  Component Value Date   HGBA1C 6.3* 12/22/2015   Patient is on Vitamin D supplement.   Lab Results  Component Value Date   VD25OH 80 12/22/2015     BMI is Body mass index is 29.38 kg/(m^2)., she is working on diet and exercise. She has been on phentermine for 6 months.  Wt Readings from Last 3 Encounters:  03/23/16 153 lb (69.4 kg)  12/22/15 153 lb 9.6 oz (69.673 kg)  09/18/15 155 lb (70.308 kg)    Medication Review Current  Outpatient Prescriptions on File Prior to Visit  Medication Sig Dispense Refill  . aspirin 81 MG chewable tablet Chew by mouth daily.    Marland Kitchen BAYER MICROLET LANCETS lancets Use as instructed 100 each 12  . bisoprolol-hydrochlorothiazide (ZIAC) 5-6.25 MG  tablet TAKE 1 TABLET EVERY DAY 90 tablet 1  . Blood Glucose Monitoring Suppl (CONTOUR BLOOD GLUCOSE SYSTEM) DEVI Test Blood sugar once daily 1 Device 0  . cetirizine (ZYRTEC) 10 MG tablet Take 10 mg by mouth daily.    . Cholecalciferol (VITAMIN D PO) Take 6,000 Int'l Units by mouth daily.     . ferrous sulfate dried (SLOW FE) 160 (50 FE) MG TBCR SR tablet Take 160 mg by mouth daily.    Marland Kitchen glucose blood (BAYER CONTOUR TEST) test strip Use as instructed 100 each 12  . hydrochlorothiazide (HYDRODIURIL) 25 MG tablet TAKE 1 TABLET  DAILY FOR BLOOD PRESSURE AND FLUID 90 tablet 3  . Magnesium 500 MG CAPS Take 500 mg by mouth daily.    . meclizine (ANTIVERT) 25 MG tablet Take 1 tablet (25 mg total) by mouth 3 (three) times daily as needed for dizziness. 30 tablet 0  . metFORMIN (GLUCOPHAGE-XR) 500 MG 24 hr tablet TAKE 2 TABLETS TWICE DAILY 360 tablet 1  . montelukast (SINGULAIR) 10 MG tablet TAKE 1 TABLET EVERY DAY  FOR  ALLERGIES 90 tablet 1  . OVER THE COUNTER MEDICATION One Source supplement 2 times daily for hair,skin and nails    . phentermine (ADIPEX-P) 37.5 MG tablet Take 1/2 to 1 tablet every morning to aid with weight loss 30 tablet 5  . promethazine (PHENERGAN) 25 MG tablet TAKE 1 TABLET BY MOUTH AS NEEDED DIZZINESS 30 tablet 0  . simvastatin (ZOCOR) 20 MG tablet TAKE 1 TABLET AT BEDTIME FOR CHOLESTEROL 90 tablet 1   No current facility-administered medications on file prior to visit.    Allergies: Allergies  Allergen Reactions  . Penicillins     Current Problems (verified) has Hyperlipidemia; Hypertension; Vitamin D deficiency; Meniere's disease; Medication management; Morbid obesity (BMI 30.81); Medicare annual wellness visit, subsequent; and Type 2 diabetes mellitus with sensory neuropathy ([place]) on her problem list.  Screening Tests Immunization History  Administered Date(s) Administered  . Influenza Split 07/16/2013, 07/24/2014  . Influenza, High Dose Seasonal PF 06/15/2015  .  PPD Test 04/14/2014  . Pneumococcal Conjugate-13 10/31/2014  . Pneumococcal Polysaccharide-23 12/01/2004  . Tdap 04/06/2011  . Zoster 07/16/2013    Preventative care: Last colonoscopy: 2010 Last mammogram: 2017 Last pap smear/pelvic exam: remote, declines another DEXA: will get next year with MGM CXR:11/2010  Prior vaccinations: TD or Tdap: 2012  Influenza: 2016 Pneumococcal: 2006 Prevnar13: 2016 Shingles/Zostavax: 2014  Names of Other Physician/Practitioners you currently use: 1. [facility]- here for primary care 2. [facility], eye doctor, last visit May 2017, wears glasses, got new ones 3. Dr. Bjorn Pippin, dentist, last visit 2016 Patient Care Team: [name], MD as PCP - General (Internal Medicine) Inda Castle, MD as Consulting Physician (Gastroenterology) Rozetta Nunnery, MD as Consulting Physician (Otolaryngology)  Surgical: She  has past surgical history that includes ORIF ankle fracture (Left, 2000) and Abdominal hysterectomy (1998). Family Her family history includes Cancer in her mother; Diabetes in her father; Heart disease in her father and son; Hypertension in her father. Social history  She reports that she has never smoked. She does not have any smokeless tobacco history on file. She reports that she drinks alcohol. She  reports that she does not use illicit drugs.  MEDICARE WELLNESS OBJECTIVES: Physical activity: Current Exercise Habits: Home exercise routine, Type of exercise: walking, Time (Minutes): 20, Frequency (Times/Week): 2, Weekly Exercise (Minutes/Week): 40, Intensity: Mild Cardiac risk factors:   Depression/mood screen:   Depression screen [facility] 2/9 03/23/2016  Decreased Interest 0  Down, Depressed, Hopeless 0  PHQ - 2 Score 0    ADLs:  In your present state of health, do you have any difficulty performing the following activities: 03/23/2016 12/22/2015  Hearing? N N  Vision? N N  Difficulty  concentrating or making decisions? N N  Walking or climbing stairs? N N  Dressing or bathing? N N  Doing errands, shopping? N N     Cognitive Testing  Alert? Yes  Normal Appearance?Yes  Oriented to person? Yes  Place? Yes   Time? Yes  Recall of three objects?  Yes  Can perform simple calculations? Yes  Displays appropriate judgment?Yes  Can read the correct time from a watch face?Yes  EOL planning: Does patient have an advance directive?: No Would patient like information on creating an advanced directive?: Yes - Educational materials given   Review of Systems  Constitutional: Negative.   HENT: Negative.   Eyes: Negative.   Respiratory: Negative.   Cardiovascular: Negative.   Gastrointestinal: Negative.   Genitourinary: Negative.   Musculoskeletal: Negative.   Skin: Negative.   Neurological: Positive for tingling (bilateral toes).  Endo/Heme/Allergies: Negative.   Psychiatric/Behavioral: Negative.      Objective:   Today's Vitals   03/23/16 0933  BP: 130/80  Pulse: 84  Temp: 97.5 F (36.4 C)  TempSrc: Temporal  Resp: 16  Height: 5' 0.5" (1.537 m)  Weight: 153 lb (69.4 kg)  SpO2: 98%  PainSc: 0-No pain   Body mass index is 29.38 kg/(m^2).  General appearance: alert, no distress, WD/WN,  female HEENT: normocephalic, sclerae anicteric, TMs pearly, nares patent, no discharge or erythema, pharynx normal Oral cavity: MMM, no lesions Neck: supple, no lymphadenopathy, no thyromegaly, no masses Heart: RRR, normal S1, S2, no murmurs Lungs: CTA bilaterally, no wheezes, rhonchi, or rales Abdomen: +bs, soft, non tender, non distended, no masses, no hepatomegaly, no splenomegaly Musculoskeletal: nontender, no swelling, no obvious deformity Extremities: no edema, no cyanosis, no clubbing Pulses: 2+ symmetric, upper and lower extremities, normal cap refill Neurological: alert, oriented x 3, CN2-12 intact, strength normal upper extremities and lower extremities, sensation  normal throughout, DTRs 2+ throughout, no cerebellar signs, gait normal Psychiatric: normal affect, behavior normal, pleasant  Breast: defer Gyn: defer Rectal: defer   Medicare Attestation I have personally reviewed: The patient's medical and social history Their use of alcohol, tobacco or illicit drugs Their current medications and supplements The patient's functional ability including ADLs,fall risks, home safety risks, cognitive, and hearing and visual impairment Diet and physical activities Evidence for depression or mood disorders  The patient's weight, height, BMI, and visual acuity have been recorded in the chart.  I have made referrals, counseling, and provided education to the patient based on review of the above and I have provided the patient with a written personalized care plan for preventive services.     Vicie Mutters, PA-C   03/23/2016

## 2016-03-23 NOTE — Patient Instructions (Signed)
We are starting you on Metformin to prevent or treat diabetes. Metformin does not cause low blood sugars. In order to create energy your cells need insulin and sugar but sometime your cells do not accept the insulin and this can cause increased sugars and decreased energy. The Metformin helps your cells accept insulin and the sugar to give you more energy.   The two most common side effects are nausea and diarrhea, follow these rules to avoid it! You can take imodium per box instructions when starting metformin if needed.   Rules of metformin: 1) start out slow with only one pill daily. Our goal for you is 4 pills a day or 2000mg  total.  2) take with your largest meal. 3) Take with least amount of carbs.   Call if you have any problems.   We want weight loss that will last so you should lose 1-2 pounds a week.  THAT IS IT! Please pick THREE things a month to change. Once it is a habit check off the item. Then pick another three items off the list to become habits.  If you are already doing a habit on the list GREAT!  Cross that item off! o Don't drink your calories. Ie, alcohol, soda, fruit juice, and sweet tea.  o Drink more water. Drink a glass when you feel hungry or before each meal.  o Eat breakfast - Complex carb and protein (likeDannon light and fit yogurt, oatmeal, fruit, eggs, [place] bacon). o Measure your cereal.  Eat no more than one cup a day. (ie [place]) o Eat an apple a day. o Add a vegetable a day. o Try a new vegetable a month. o Use Pam! Stop using oil or butter to cook. o Don't finish your plate or use smaller plates. o Share your dessert. o Eat sugar free Jello for dessert or frozen grapes. o Don't eat 2-3 hours before bed. o Switch to whole wheat bread, pasta, and brown rice. o Make healthier choices when you eat out. No fries! o Pick baked chicken, NOT fried. o Don't forget to SLOW DOWN when you eat. It is not going anywhere.  o Take the stairs. o Park far away in the  parking lot o [employer] (or weights) for 10 minutes while watching TV. o Walk at work for 10 minutes during break. o Walk outside 1 time a week with your friend, kids, dog, or significant other. o Start a walking group at [place] the mall as much as you can tolerate.  o Keep a food diary. o Weigh yourself daily. o Walk for 15 minutes 3 days per week. o Cook at home more often and eat out less.  If life happens and you go back to old habits, it is okay.  Just start over. You can do it!   If you experience chest pain, get short of breath, or tired during the exercise, please stop immediately and inform your doctor.

## 2016-03-24 DIAGNOSIS — R040 Epistaxis: Secondary | ICD-10-CM | POA: Diagnosis not present

## 2016-03-27 ENCOUNTER — Other Ambulatory Visit: Payer: Self-pay | Admitting: Physician Assistant

## 2016-04-03 ENCOUNTER — Other Ambulatory Visit: Payer: Self-pay | Admitting: Physician Assistant

## 2016-05-12 ENCOUNTER — Other Ambulatory Visit: Payer: Self-pay | Admitting: Internal Medicine

## 2016-06-08 ENCOUNTER — Other Ambulatory Visit: Payer: Self-pay | Admitting: Internal Medicine

## 2016-07-04 ENCOUNTER — Encounter: Payer: Self-pay | Admitting: Internal Medicine

## 2016-07-04 ENCOUNTER — Ambulatory Visit (INDEPENDENT_AMBULATORY_CARE_PROVIDER_SITE_OTHER): Payer: Medicare Other | Admitting: Internal Medicine

## 2016-07-04 VITALS — BP 140/80 | HR 80 | Temp 97.9°F | Resp 16 | Ht 60.5 in | Wt 153.6 lb

## 2016-07-04 DIAGNOSIS — Z136 Encounter for screening for cardiovascular disorders: Secondary | ICD-10-CM

## 2016-07-04 DIAGNOSIS — Z79899 Other long term (current) drug therapy: Secondary | ICD-10-CM | POA: Diagnosis not present

## 2016-07-04 DIAGNOSIS — E559 Vitamin D deficiency, unspecified: Secondary | ICD-10-CM

## 2016-07-04 DIAGNOSIS — I10 Essential (primary) hypertension: Secondary | ICD-10-CM

## 2016-07-04 DIAGNOSIS — E1140 Type 2 diabetes mellitus with diabetic neuropathy, unspecified: Secondary | ICD-10-CM | POA: Diagnosis not present

## 2016-07-04 DIAGNOSIS — E782 Mixed hyperlipidemia: Secondary | ICD-10-CM | POA: Diagnosis not present

## 2016-07-04 DIAGNOSIS — Z1212 Encounter for screening for malignant neoplasm of rectum: Secondary | ICD-10-CM

## 2016-07-04 DIAGNOSIS — Z23 Encounter for immunization: Secondary | ICD-10-CM | POA: Diagnosis not present

## 2016-07-04 LAB — CBC WITH DIFFERENTIAL/PLATELET
BASOS ABS: 0 {cells}/uL (ref 0–200)
BASOS PCT: 0 %
EOS ABS: 303 {cells}/uL (ref 15–500)
Eosinophils Relative: 3 %
HEMATOCRIT: 37.8 % (ref 35.0–45.0)
Hemoglobin: 12.5 g/dL (ref 11.7–15.5)
Lymphocytes Relative: 34 %
Lymphs Abs: 3434 cells/uL (ref 850–3900)
MCH: 26.9 pg — AB (ref 27.0–33.0)
MCHC: 33.1 g/dL (ref 32.0–36.0)
MCV: 81.3 fL (ref 80.0–100.0)
MONO ABS: 606 {cells}/uL (ref 200–950)
MPV: 9.7 fL (ref 7.5–12.5)
Monocytes Relative: 6 %
NEUTROS PCT: 57 %
Neutro Abs: 5757 cells/uL (ref 1500–7800)
Platelets: 280 10*3/uL (ref 140–400)
RBC: 4.65 MIL/uL (ref 3.80–5.10)
RDW: 14.7 % (ref 11.0–15.0)
WBC: 10.1 10*3/uL (ref 3.8–10.8)

## 2016-07-04 LAB — BASIC METABOLIC PANEL WITH GFR
BUN: 13 mg/dL (ref 7–25)
CO2: 28 mmol/L (ref 20–31)
CREATININE: 0.68 mg/dL (ref 0.50–0.99)
Calcium: 10 mg/dL (ref 8.6–10.4)
Chloride: 97 mmol/L — ABNORMAL LOW (ref 98–110)
GFR, Est Non African American: >89 mL/min (ref >=60)
GLUCOSE: 138 mg/dL — AB (ref 65–99)
POTASSIUM: 3.4 mmol/L — AB (ref 3.5–5.3)
Sodium: 136 mmol/L (ref 135–146)

## 2016-07-04 LAB — HEPATIC FUNCTION PANEL
ALBUMIN: 4.4 g/dL (ref 3.6–5.1)
ALK PHOS: 77 U/L (ref 33–130)
ALT: 25 U/L (ref 6–29)
AST: 24 U/L (ref 10–35)
Bilirubin, Direct: 0.1 mg/dL (ref <=0.2)
Indirect Bilirubin: 0.4 mg/dL (ref 0.2–1.2)
TOTAL PROTEIN: 7.6 g/dL (ref 6.1–8.1)
Total Bilirubin: 0.5 mg/dL (ref 0.2–1.2)

## 2016-07-04 LAB — LIPID PANEL
Cholesterol: 152 mg/dL (ref 125–200)
HDL: 43 mg/dL — ABNORMAL LOW (ref >=46)
LDL CALC: 80 mg/dL (ref <130)
TRIGLYCERIDES: 146 mg/dL (ref <150)
Total CHOL/HDL Ratio: 3.5 Ratio (ref <=5.0)
VLDL: 29 mg/dL (ref <30)

## 2016-07-04 LAB — TSH: TSH: 3.77 m[IU]/L

## 2016-07-04 LAB — MAGNESIUM: Magnesium: 1.8 mg/dL (ref 1.5–2.5)

## 2016-07-04 NOTE — Progress Notes (Signed)
[facility] [name], M.D.    Uvaldo Bristle. [name], P.A.-C      Starlyn Skeans, P.A.-C  [facility]                [address] [place] [id number] Telephone [phone] Telefax [phone]  Comprehensive Evaluation &  Examination     This very nice 65 y.o. presents for a Screening/Preventative Visit & comprehensive evaluation and management of multiple medical co-morbidities.  Patient has been followed for HTN, T2_NIDDM, Hyperlipidemia and Vitamin D Deficiency.      HTN predates circa 2004 and BP has been controlled at home and patient denies any cardiac symptoms as chest pain, palpitations, shortness of breath, dizziness or ankle swelling. Today's BP is  140/80.      Patient's hyperlipidemia is controlled with diet and medications. Patient denies myalgias or other medication SE's. Last lipids were at goal: Lab Results  Component Value Date   CHOL 139 03/23/2016   HDL 44 (L) 03/23/2016   LDLCALC 72 03/23/2016   TRIG 114 03/23/2016   CHOLHDL 3.2 03/23/2016      Patient has T2_NIDDM prediabetes predating since 2004 and patient denies reactive hypoglycemic symptoms, visual blurring, diabetic polys, but does c/o numb paresthesias. Of the feet & toes Last A1c was not at goal: Lab Results  Component Value Date   HGBA1C 6.3 (H) 03/23/2016      Finally, patient has history of Vitamin D Deficiency and last Vitamin D was at goal: Lab Results  Component Value Date   VD25OH 80 12/22/2015   Current Outpatient Prescriptions on File Prior to Visit  Medication Sig  . aspirin 81 MG chewable tablet Chew by mouth daily.  Marland Kitchen BAYER MICROLET LANCETS lancets Use as instructed  . bisoprolol-hydrochlorothiazide (ZIAC) 5-6.25 MG tablet TAKE 1 TABLET EVERY DAY  . Blood Glucose Monitoring Suppl (CONTOUR BLOOD GLUCOSE SYSTEM) DEVI Test Blood sugar once daily  . cetirizine (ZYRTEC) 10 MG tablet Take 10 mg by  mouth daily.  . Cholecalciferol (VITAMIN D PO) Take 6,000 Int'l Units by mouth daily.   . ferrous sulfate dried (SLOW FE) 160 (50 FE) MG TBCR SR tablet Take 160 mg by mouth daily.  Marland Kitchen glucose blood (BAYER CONTOUR TEST) test strip Use as instructed  . hydrochlorothiazide (HYDRODIURIL) 25 MG tablet TAKE 1 TABLET  DAILY FOR BLOOD PRESSURE AND FLUID  . Magnesium 500 MG CAPS Take 500 mg by mouth daily.  . meclizine (ANTIVERT) 25 MG tablet TAKE ONE TABLET BY MOUTH THREE TIMES DAILY AS NEEDED FOR DIZZINESS  . metFORMIN (GLUCOPHAGE-XR) 500 MG 24 hr tablet TAKE 2 TABLETS TWICE DAILY  . montelukast (SINGULAIR) 10 MG tablet TAKE 1 TABLET EVERY DAY  FOR  ALLERGIES  . OVER THE COUNTER MEDICATION One Source supplement 2 times daily for hair,skin and nails  . phentermine (ADIPEX-P) 37.5 MG tablet Take 1/2 to 1 tablet every morning to aid with weight loss  . promethazine (PHENERGAN) 25 MG tablet TAKE ONE TABLET BY MOUTH ONCE DAILY AS NEEDED FOR DIZZINESS  . promethazine (PHENERGAN) 25 MG tablet TAKE ONE TABLET BY MOUTH ONCE DAILY AS NEEDED FOR DIZZINESS  . simvastatin (ZOCOR) 20 MG tablet TAKE 1 TABLET AT BEDTIME FOR CHOLESTEROL   No current facility-administered medications on file prior to visit.    Allergies  Allergen Reactions  . Penicillins    Past Medical History:  Diagnosis Date  . Allergy   . Hyperlipidemia   . Hypertension   . Iron deficiency   . Meniere's disease   . Neuropathy ([place])   . Type II or unspecified type diabetes mellitus without mention of complication, not stated as uncontrolled   . Vitamin D deficiency    Health Maintenance  Topic Date Due  . DEXA SCAN  08/03/2014  . INFLUENZA VACCINE  04/19/2016  . URINE MICROALBUMIN  06/14/2016  . HEMOGLOBIN A1C  09/23/2016  . OPHTHALMOLOGY EXAM  02/02/2017  . FOOT EXAM  03/23/2017  . MAMMOGRAM  02/18/2018  . COLONOSCOPY  03/28/2019  . TETANUS/TDAP  04/05/2021  . ZOSTAVAX  Completed  . Hepatitis C Screening  Completed  . PNA vac  Low Risk Adult  Completed   Immunization History  Administered Date(s) Administered  . Influenza Split 07/16/2013, 07/24/2014  . Influenza, High Dose Seasonal PF 06/15/2015  . PPD Test 04/14/2014  . Pneumococcal Conjugate-13 10/31/2014  . Pneumococcal Polysaccharide-23 12/01/2004, 03/23/2016  . Tdap 04/06/2011  . Zoster 07/16/2013   Past Surgical History:  Procedure Laterality Date  . ABDOMINAL HYSTERECTOMY  1998   BSO  . ORIF ANKLE FRACTURE Left 2000   Family History  Problem Relation Age of Onset  . Cancer Mother     PANCREAS  . Hypertension Father   . Heart disease Father   . Diabetes Father   . Heart disease Son    Social History  Substance Use Topics  . Smoking status: Never Smoker  . Smokeless tobacco: Not on file  . Alcohol use Yes     Comment: rarely    ROS Constitutional: Denies fever, chills, weight loss/gain, headaches, insomnia,  night sweats, and change in appetite. Does c/o fatigue. Eyes: Denies redness, blurred vision, diplopia, discharge, itchy, watery eyes.  ENT: Denies discharge, congestion, post nasal drip, epistaxis, sore throat, earache, hearing loss, dental pain, Tinnitus, Vertigo, Sinus pain, snoring.  Cardio: Denies chest pain, palpitations, irregular heartbeat, syncope, dyspnea, diaphoresis, orthopnea, PND, claudication, edema Respiratory: denies cough, dyspnea, DOE, pleurisy, hoarseness, laryngitis, wheezing.  Gastrointestinal: Denies dysphagia, heartburn, reflux, water brash, pain, cramps, nausea, vomiting, bloating, diarrhea, constipation, hematemesis, melena, hematochezia, jaundice, hemorrhoids Genitourinary: Denies dysuria, frequency, urgency, nocturia, hesitancy, discharge, hematuria, flank pain Breast: Breast lumps, nipple discharge, bleeding.  Musculoskeletal: Denies arthralgia, myalgia, stiffness, Jt. Swelling, pain, limp, and strain/sprain. Denies falls. Skin: Denies puritis, rash, hives, warts, acne, eczema, changing in skin  lesion Neuro: No weakness, tremor, incoordination, spasms, paresthesia, pain Psychiatric: Denies confusion, memory loss, sensory loss. Denies Depression. Endocrine: Denies change in weight, skin, hair change, nocturia, and paresthesia, diabetic polys, visual blurring, hyper / hypo glycemic episodes.  Heme/Lymph: No excessive bleeding, bruising, enlarged lymph nodes.  Physical Exam  BP 140/80   Pulse 80   Temp 97.9 F (36.6 C)   Resp 16   Ht 5' 0.5" (1.537 m)   Wt 153 lb 9.6 oz (69.7 kg)   BMI 29.50 kg/m   General Appearance: Over nourished with central obesity and in no apparent distress.  Eyes: PERRLA, EOMs, conjunctiva no swelling or erythema, normal fundi and vessels. Sinuses: No frontal/maxillary tenderness ENT/Mouth: EACs patent / TMs  nl. Nares clear without erythema, swelling, mucoid exudates. Oral hygiene is good. No erythema, swelling, or exudate. Tongue normal, non-obstructing. Tonsils not swollen or erythematous. Hearing normal.  Neck: Supple, thyroid normal. No bruits, nodes or JVD. Respiratory: Respiratory effort normal.  BS equal and clear bilateral without rales, rhonci, wheezing or stridor. Cardio: Heart sounds  are normal with regular rate and rhythm and no murmurs, rubs or gallops. Peripheral pulses are normal and equal bilaterally without edema. No aortic or femoral bruits. Chest: symmetric with normal excursions and percussion. Breasts: Symmetric, without lumps, nipple discharge, retractions, or fibrocystic changes.  Abdomen: Flat, soft with bowel sounds active. Nontender, no guarding, rebound, hernias, masses, or organomegaly.  Lymphatics: Non tender without lymphadenopathy.  Genitourinary:  Musculoskeletal: Full ROM all peripheral extremities, joint stability, 5/5 strength, and normal gait. Skin: Warm and dry without rashes, lesions, cyanosis, clubbing or  ecchymosis.  Neuro: Cranial nerves intact, reflexes equal bilaterally. Normal muscle tone, no cerebellar  symptoms. Sensation intact to touch, vibratory, slightly decreased to Monofilament testing at ankle level in a stocking distribution.  Pysch: Alert and oriented X 3, normal affect, Insight and Judgment appropriate.   Assessment and Plan 1. Essential hypertension  - Microalbumin / creatinine urine ratio - EKG 12-Lead - TSH  2. Mixed hyperlipidemia  - Lipid panel - TSH  3. Type 2 diabetes mellitus with sensory neuropathy (HCC)  - Microalbumin / creatinine urine ratio - HM DIABETES FOOT EXAM - LOW EXTREMITY NEUR EXAM DOCUM - Hemoglobin A1c - Insulin, random  4. Vitamin D deficiency  - VITAMIN D 25 Hydroxy  5. Screening for rectal cancer  - POC Hemoccult Bld/Stl   6. Screening for ischemic heart disease   7. Medication management  - Urinalysis, Routine w reflex microscopic  - CBC with Differential/Platelet - BASIC METABOLIC PANEL WITH GFR - Hepatic function panel - Magnesium     Continue prudent diet as discussed, weight control, BP monitoring, regular exercise, and medications. Discussed med's effects and SE's. Screening labs and tests as requested with regular follow-up as recommended. Over 40 minutes of exam, counseling, chart review and high complex critical decision making was performed.

## 2016-07-04 NOTE — Addendum Note (Signed)
Addended by: Melbourne Abts C on: 07/04/2016 01:38 PM   Modules accepted: Orders

## 2016-07-04 NOTE — Patient Instructions (Signed)
Preventive Care for Adults A healthy lifestyle and preventive care can promote health and wellness. Preventive health guidelines for women include the following key practices.  A routine yearly physical is a good way to check with your health care provider about your health and preventive screening. It is a chance to share any concerns and updates on your health and to receive a thorough exam.  Visit your dentist for a routine exam and preventive care every 6 months. Brush your teeth twice a day and floss once a day. Good oral hygiene prevents tooth decay and gum disease.  The frequency of eye exams is based on your age, health, family medical history, use of contact lenses, and other factors. Follow your health care provider's recommendations for frequency of eye exams.  Eat a healthy diet. Foods like vegetables, fruits, whole grains, low-fat dairy products, and lean protein foods contain the nutrients you need without too many calories. Decrease your intake of foods high in solid fats, added sugars, and salt. Eat the right amount of calories for you.Get information about a proper diet from your health care provider, if necessary.  Regular physical exercise is one of the most important things you can do for your health. Most adults should get at least 150 minutes of moderate-intensity exercise (any activity that increases your heart rate and causes you to sweat) each week. In addition, most adults need muscle-strengthening exercises on 2 or more days a week.  Maintain a healthy weight. The body mass index (BMI) is a screening tool to identify possible weight problems. It provides an estimate of body fat based on height and weight. Your health care provider can find your BMI and can help you achieve or maintain a healthy weight.For adults 20 years and older:  A BMI below 18.5 is considered underweight.  A BMI of 18.5 to 24.9 is normal.  A BMI of 25 to 29.9 is considered overweight.  A BMI of  30 and above is considered obese.  Maintain normal blood lipids and cholesterol levels by exercising and minimizing your intake of saturated fat. Eat a balanced diet with plenty of fruit and vegetables. Blood tests for lipids and cholesterol should begin at age 76 and be repeated every 5 years. If your lipid or cholesterol levels are high, you are over 50, or you are at high risk for heart disease, you may need your cholesterol levels checked more frequently.Ongoing high lipid and cholesterol levels should be treated with medicines if diet and exercise are not working.  If you smoke, find out from your health care provider how to quit. If you do not use tobacco, do not start.  Lung cancer screening is recommended for adults aged 22-80 years who are at high risk for developing lung cancer because of a history of smoking. A yearly low-dose CT scan of the lungs is recommended for people who have at least a 30-pack-year history of smoking and are a current smoker or have quit within the past 15 years. A pack year of smoking is smoking an average of 1 pack of cigarettes a day for 1 year (for example: 1 pack a day for 30 years or 2 packs a day for 15 years). Yearly screening should continue until the smoker has stopped smoking for at least 15 years. Yearly screening should be stopped for people who develop a health problem that would prevent them from having lung cancer treatment.  If you are pregnant, do not drink alcohol. If you are breastfeeding,  be very cautious about drinking alcohol. If you are not pregnant and choose to drink alcohol, do not have more than 1 drink per day. One drink is considered to be 12 ounces (355 mL) of beer, 5 ounces (148 mL) of wine, or 1.5 ounces (44 mL) of liquor.  Avoid use of street drugs. Do not share needles with anyone. Ask for help if you need support or instructions about stopping the use of drugs.  High blood pressure causes heart disease and increases the risk of  stroke. Your blood pressure should be checked at least every 1 to 2 years. Ongoing high blood pressure should be treated with medicines if weight loss and exercise do not work.  If you are 75-52 years old, ask your health care provider if you should take aspirin to prevent strokes.  Diabetes screening involves taking a blood sample to check your fasting blood sugar level. This should be done once every 3 years, after age 15, if you are within normal weight and without risk factors for diabetes. Testing should be considered at a younger age or be carried out more frequently if you are overweight and have at least 1 risk factor for diabetes.  Breast cancer screening is essential preventive care for women. You should practice "breast self-awareness." This means understanding the normal appearance and feel of your breasts and may include breast self-examination. Any changes detected, no matter how small, should be reported to a health care provider. Women in their 58s and 30s should have a clinical breast exam (CBE) by a health care provider as part of a regular health exam every 1 to 3 years. After age 16, women should have a CBE every year. Starting at age 53, women should consider having a mammogram (breast X-ray test) every year. Women who have a family history of breast cancer should talk to their health care provider about genetic screening. Women at a high risk of breast cancer should talk to their health care providers about having an MRI and a mammogram every year.  Breast cancer gene (BRCA)-related cancer risk assessment is recommended for women who have family members with BRCA-related cancers. BRCA-related cancers include breast, ovarian, tubal, and peritoneal cancers. Having family members with these cancers may be associated with an increased risk for harmful changes (mutations) in the breast cancer genes BRCA1 and BRCA2. Results of the assessment will determine the need for genetic counseling and  BRCA1 and BRCA2 testing.  Routine pelvic exams to screen for cancer are no longer recommended for nonpregnant women who are considered low risk for cancer of the pelvic organs (ovaries, uterus, and vagina) and who do not have symptoms. Ask your health care provider if a screening pelvic exam is right for you.  If you have had past treatment for cervical cancer or a condition that could lead to cancer, you need Pap tests and screening for cancer for at least 20 years after your treatment. If Pap tests have been discontinued, your risk factors (such as having a new sexual partner) need to be reassessed to determine if screening should be resumed. Some women have medical problems that increase the chance of getting cervical cancer. In these cases, your health care provider may recommend more frequent screening and Pap tests.  The HPV test is an additional test that may be used for cervical cancer screening. The HPV test looks for the virus that can cause the cell changes on the cervix. The cells collected during the Pap test can be  tested for HPV. The HPV test could be used to screen women aged 30 years and older, and should be used in women of any age who have unclear Pap test results. After the age of 30, women should have HPV testing at the same frequency as a Pap test.  Colorectal cancer can be detected and often prevented. Most routine colorectal cancer screening begins at the age of 50 years and continues through age 75 years. However, your health care provider may recommend screening at an earlier age if you have risk factors for colon cancer. On a yearly basis, your health care provider may provide home test kits to check for hidden blood in the stool. Use of a small camera at the end of a tube, to directly examine the colon (sigmoidoscopy or colonoscopy), can detect the earliest forms of colorectal cancer. Talk to your health care provider about this at age 50, when routine screening begins. Direct  exam of the colon should be repeated every 5-10 years through age 75 years, unless early forms of pre-cancerous polyps or small growths are found.  People who are at an increased risk for hepatitis B should be screened for this virus. You are considered at high risk for hepatitis B if:  You were born in a country where hepatitis B occurs often. Talk with your health care provider about which countries are considered high risk.  Your parents were born in a high-risk country and you have not received a shot to protect against hepatitis B (hepatitis B vaccine).  You have HIV or AIDS.  You use needles to inject street drugs.  You live with, or have sex with, someone who has hepatitis B.  You get hemodialysis treatment.  You take certain medicines for conditions like cancer, organ transplantation, and autoimmune conditions.  Hepatitis C blood testing is recommended for all people born from 1945 through 1965 and any individual with known risks for hepatitis C.  Practice safe sex. Use condoms and avoid high-risk sexual practices to reduce the spread of sexually transmitted infections (STIs). STIs include gonorrhea, chlamydia, syphilis, trichomonas, herpes, HPV, and human immunodeficiency virus (HIV). Herpes, HIV, and HPV are viral illnesses that have no cure. They can result in disability, cancer, and death.  You should be screened for sexually transmitted illnesses (STIs) including gonorrhea and chlamydia if:  You are sexually active and are younger than 24 years.  You are older than 24 years and your health care provider tells you that you are at risk for this type of infection.  Your sexual activity has changed since you were last screened and you are at an increased risk for chlamydia or gonorrhea. Ask your health care provider if you are at risk.  If you are at risk of being infected with HIV, it is recommended that you take a prescription medicine daily to prevent HIV infection. This is  called preexposure prophylaxis (PrEP). You are considered at risk if:  You are a heterosexual woman, are sexually active, and are at increased risk for HIV infection.  You take drugs by injection.  You are sexually active with a partner who has HIV.  Talk with your health care provider about whether you are at high risk of being infected with HIV. If you choose to begin PrEP, you should first be tested for HIV. You should then be tested every 3 months for as long as you are taking PrEP.  Osteoporosis is a disease in which the bones lose minerals and strength   with aging. This can result in serious bone fractures or breaks. The risk of osteoporosis can be identified using a bone density scan. Women ages 65 years and over and women at risk for fractures or osteoporosis should discuss screening with their health care providers. Ask your health care provider whether you should take a calcium supplement or vitamin D to reduce the rate of osteoporosis.  Menopause can be associated with physical symptoms and risks. Hormone replacement therapy is available to decrease symptoms and risks. You should talk to your health care provider about whether hormone replacement therapy is right for you.  Use sunscreen. Apply sunscreen liberally and repeatedly throughout the day. You should seek shade when your shadow is shorter than you. Protect yourself by wearing long sleeves, pants, a wide-brimmed hat, and sunglasses year round, whenever you are outdoors.  Once a month, do a whole body skin exam, using a mirror to look at the skin on your back. Tell your health care provider of new moles, moles that have irregular borders, moles that are larger than a pencil eraser, or moles that have changed in shape or color.  Stay current with required vaccines (immunizations).  Influenza vaccine. All adults should be immunized every year.  Tetanus, diphtheria, and acellular pertussis (Td, Tdap) vaccine. Pregnant women should  receive 1 dose of Tdap vaccine during each pregnancy. The dose should be obtained regardless of the length of time since the last dose. Immunization is preferred during the 27th-36th week of gestation. An adult who has not previously received Tdap or who does not know her vaccine status should receive 1 dose of Tdap. This initial dose should be followed by tetanus and diphtheria toxoids (Td) booster doses every 10 years. Adults with an unknown or incomplete history of completing a 3-dose immunization series with Td-containing vaccines should begin or complete a primary immunization series including a Tdap dose. Adults should receive a Td booster every 10 years.  Varicella vaccine. An adult without evidence of immunity to varicella should receive 2 doses or a second dose if she has previously received 1 dose. Pregnant females who do not have evidence of immunity should receive the first dose after pregnancy. This first dose should be obtained before leaving the health care facility. The second dose should be obtained 4-8 weeks after the first dose.  Human papillomavirus (HPV) vaccine. Females aged 13-26 years who have not received the vaccine previously should obtain the 3-dose series. The vaccine is not recommended for use in pregnant females. However, pregnancy testing is not needed before receiving a dose. If a female is found to be pregnant after receiving a dose, no treatment is needed. In that case, the remaining doses should be delayed until after the pregnancy. Immunization is recommended for any person with an immunocompromised condition through the age of 26 years if she did not get any or all doses earlier. During the 3-dose series, the second dose should be obtained 4-8 weeks after the first dose. The third dose should be obtained 24 weeks after the first dose and 16 weeks after the second dose.  Zoster vaccine. One dose is recommended for adults aged 60 years or older unless certain conditions are  present.  Measles, mumps, and rubella (MMR) vaccine. Adults born before 1957 generally are considered immune to measles and mumps. Adults born in 1957 or later should have 1 or more doses of MMR vaccine unless there is a contraindication to the vaccine or there is laboratory evidence of immunity to   each of the three diseases. A routine second dose of MMR vaccine should be obtained at least 28 days after the first dose for students attending postsecondary schools, health care workers, or international travelers. People who received inactivated measles vaccine or an unknown type of measles vaccine during 1963-1967 should receive 2 doses of MMR vaccine. People who received inactivated mumps vaccine or an unknown type of mumps vaccine before 1979 and are at high risk for mumps infection should consider immunization with 2 doses of MMR vaccine. For females of childbearing age, rubella immunity should be determined. If there is no evidence of immunity, females who are not pregnant should be vaccinated. If there is no evidence of immunity, females who are pregnant should delay immunization until after pregnancy. Unvaccinated health care workers born before 1957 who lack laboratory evidence of measles, mumps, or rubella immunity or laboratory confirmation of disease should consider measles and mumps immunization with 2 doses of MMR vaccine or rubella immunization with 1 dose of MMR vaccine.  Pneumococcal 13-valent conjugate (PCV13) vaccine. When indicated, a person who is uncertain of her immunization history and has no record of immunization should receive the PCV13 vaccine. An adult aged 19 years or older who has certain medical conditions and has not been previously immunized should receive 1 dose of PCV13 vaccine. This PCV13 should be followed with a dose of pneumococcal polysaccharide (PPSV23) vaccine. The PPSV23 vaccine dose should be obtained at least 8 weeks after the dose of PCV13 vaccine. An adult aged 19  years or older who has certain medical conditions and previously received 1 or more doses of PPSV23 vaccine should receive 1 dose of PCV13. The PCV13 vaccine dose should be obtained 1 or more years after the last PPSV23 vaccine dose.  Pneumococcal polysaccharide (PPSV23) vaccine. When PCV13 is also indicated, PCV13 should be obtained first. All adults aged 65 years and older should be immunized. An adult younger than age 65 years who has certain medical conditions should be immunized. Any person who resides in a nursing home or long-term care facility should be immunized. An adult smoker should be immunized. People with an immunocompromised condition and certain other conditions should receive both PCV13 and PPSV23 vaccines. People with human immunodeficiency virus (HIV) infection should be immunized as soon as possible after diagnosis. Immunization during chemotherapy or radiation therapy should be avoided. Routine use of PPSV23 vaccine is not recommended for American Indians, [place] Natives, or people younger than 65 years unless there are medical conditions that require PPSV23 vaccine. When indicated, people who have unknown immunization and have no record of immunization should receive PPSV23 vaccine. One-time revaccination 5 years after the first dose of PPSV23 is recommended for people aged 19-64 years who have chronic kidney failure, nephrotic syndrome, asplenia, or immunocompromised conditions. People who received 1-2 doses of PPSV23 before age 65 years should receive another dose of PPSV23 vaccine at age 65 years or later if at least 5 years have passed since the previous dose. Doses of PPSV23 are not needed for people immunized with PPSV23 at or after age 65 years.  Meningococcal vaccine. Adults with asplenia or persistent complement component deficiencies should receive 2 doses of quadrivalent meningococcal conjugate (MenACWY-D) vaccine. The doses should be obtained at least 2 months apart.  Microbiologists working with certain meningococcal bacteria, military recruits, people at risk during an outbreak, and people who travel to or live in countries with a high rate of meningitis should be immunized. A first-year college student up through age   21 years who is living in a residence hall should receive a dose if she did not receive a dose on or after her 16th birthday. Adults who have certain high-risk conditions should receive one or more doses of vaccine.  Hepatitis A vaccine. Adults who wish to be protected from this disease, have certain high-risk conditions, work with hepatitis A-infected animals, work in hepatitis A research labs, or travel to or work in countries with a high rate of hepatitis A should be immunized. Adults who were previously unvaccinated and who anticipate close contact with an international adoptee during the first 60 days after arrival in the [place] from a country with a high rate of hepatitis A should be immunized.  Hepatitis B vaccine. Adults who wish to be protected from this disease, have certain high-risk conditions, may be exposed to blood or other infectious body fluids, are household contacts or sex partners of hepatitis B positive people, are clients or workers in certain care facilities, or travel to or work in countries with a high rate of hepatitis B should be immunized.  Haemophilus influenzae type b (Hib) vaccine. A previously unvaccinated person with asplenia or sickle cell disease or having a scheduled splenectomy should receive 1 dose of Hib vaccine. Regardless of previous immunization, a recipient of a hematopoietic stem cell transplant should receive a 3-dose series 6-12 months after her successful transplant. Hib vaccine is not recommended for adults with HIV infection. Preventive Services / Frequency Ages 64 to 68 years  Blood pressure check.** / Every 1 to 2 years.  Lipid and cholesterol check.** / Every 5 years beginning at age  22.  Clinical breast exam.** / Every 3 years for women in their 88s and 53s.  BRCA-related cancer risk assessment.** / For women who have family members with a BRCA-related cancer (breast, ovarian, tubal, or peritoneal cancers).  Pap test.** / Every 2 years from ages 90 through 51. Every 3 years starting at age 21 through age 56 or 3 with a history of 3 consecutive normal Pap tests.  HPV screening.** / Every 3 years from ages 24 through ages 1 to 46 with a history of 3 consecutive normal Pap tests.  Hepatitis C blood test.** / For any individual with known risks for hepatitis C.  Skin self-exam. / Monthly.  Influenza vaccine. / Every year.  Tetanus, diphtheria, and acellular pertussis (Tdap, Td) vaccine.** / Consult your health care provider. Pregnant women should receive 1 dose of Tdap vaccine during each pregnancy. 1 dose of Td every 10 years.  Varicella vaccine.** / Consult your health care provider. Pregnant females who do not have evidence of immunity should receive the first dose after pregnancy.  HPV vaccine. / 3 doses over 6 months, if 72 and younger. The vaccine is not recommended for use in pregnant females. However, pregnancy testing is not needed before receiving a dose.  Measles, mumps, rubella (MMR) vaccine.** / You need at least 1 dose of MMR if you were born in 1957 or later. You may also need a 2nd dose. For females of childbearing age, rubella immunity should be determined. If there is no evidence of immunity, females who are not pregnant should be vaccinated. If there is no evidence of immunity, females who are pregnant should delay immunization until after pregnancy.  Pneumococcal 13-valent conjugate (PCV13) vaccine.** / Consult your health care provider.  Pneumococcal polysaccharide (PPSV23) vaccine.** / 1 to 2 doses if you smoke cigarettes or if you have certain conditions.  Meningococcal vaccine.** /  1 dose if you are age 19 to 21 years and a first-year college  student living in a residence hall, or have one of several medical conditions, you need to get vaccinated against meningococcal disease. You may also need additional booster doses.  Hepatitis A vaccine.** / Consult your health care provider.  Hepatitis B vaccine.** / Consult your health care provider.  Haemophilus influenzae type b (Hib) vaccine.** / Consult your health care provider. Ages 40 to 64 years  Blood pressure check.** / Every 1 to 2 years.  Lipid and cholesterol check.** / Every 5 years beginning at age 20 years.  Lung cancer screening. / Every year if you are aged 55-80 years and have a 30-pack-year history of smoking and currently smoke or have quit within the past 15 years. Yearly screening is stopped once you have quit smoking for at least 15 years or develop a health problem that would prevent you from having lung cancer treatment.  Clinical breast exam.** / Every year after age 40 years.  BRCA-related cancer risk assessment.** / For women who have family members with a BRCA-related cancer (breast, ovarian, tubal, or peritoneal cancers).  Mammogram.** / Every year beginning at age 40 years and continuing for as long as you are in good health. Consult with your health care provider.  Pap test.** / Every 3 years starting at age 30 years through age 65 or 70 years with a history of 3 consecutive normal Pap tests.  HPV screening.** / Every 3 years from ages 30 years through ages 65 to 70 years with a history of 3 consecutive normal Pap tests.  Fecal occult blood test (FOBT) of stool. / Every year beginning at age 50 years and continuing until age 75 years. You may not need to do this test if you get a colonoscopy every 10 years.  Flexible sigmoidoscopy or colonoscopy.** / Every 5 years for a flexible sigmoidoscopy or every 10 years for a colonoscopy beginning at age 50 years and continuing until age 75 years.  Hepatitis C blood test.** / For all people born from 1945 through  1965 and any individual with known risks for hepatitis C.  Skin self-exam. / Monthly.  Influenza vaccine. / Every year.  Tetanus, diphtheria, and acellular pertussis (Tdap/Td) vaccine.** / Consult your health care provider. Pregnant women should receive 1 dose of Tdap vaccine during each pregnancy. 1 dose of Td every 10 years.  Varicella vaccine.** / Consult your health care provider. Pregnant females who do not have evidence of immunity should receive the first dose after pregnancy.  Zoster vaccine.** / 1 dose for adults aged 60 years or older.  Measles, mumps, rubella (MMR) vaccine.** / You need at least 1 dose of MMR if you were born in 1957 or later. You may also need a 2nd dose. For females of childbearing age, rubella immunity should be determined. If there is no evidence of immunity, females who are not pregnant should be vaccinated. If there is no evidence of immunity, females who are pregnant should delay immunization until after pregnancy.  Pneumococcal 13-valent conjugate (PCV13) vaccine.** / Consult your health care provider.  Pneumococcal polysaccharide (PPSV23) vaccine.** / 1 to 2 doses if you smoke cigarettes or if you have certain conditions.  Meningococcal vaccine.** / Consult your health care provider.  Hepatitis A vaccine.** / Consult your health care provider.  Hepatitis B vaccine.** / Consult your health care provider.  Haemophilus influenzae type b (Hib) vaccine.** / Consult your health care provider. Ages 65   years and over  Blood pressure check.** / Every 1 to 2 years.  Lipid and cholesterol check.** / Every 5 years beginning at age 20 years.  Lung cancer screening. / Every year if you are aged 55-80 years and have a 30-pack-year history of smoking and currently smoke or have quit within the past 15 years. Yearly screening is stopped once you have quit smoking for at least 15 years or develop a health problem that would prevent you from having lung cancer  treatment.  Clinical breast exam.** / Every year after age 40 years.  BRCA-related cancer risk assessment.** / For women who have family members with a BRCA-related cancer (breast, ovarian, tubal, or peritoneal cancers).  Mammogram.** / Every year beginning at age 40 years and continuing for as long as you are in good health. Consult with your health care provider.  Pap test.** / Every 3 years starting at age 30 years through age 65 or 70 years with 3 consecutive normal Pap tests. Testing can be stopped between 65 and 70 years with 3 consecutive normal Pap tests and no abnormal Pap or HPV tests in the past 10 years.  HPV screening.** / Every 3 years from ages 30 years through ages 65 or 70 years with a history of 3 consecutive normal Pap tests. Testing can be stopped between 65 and 70 years with 3 consecutive normal Pap tests and no abnormal Pap or HPV tests in the past 10 years.  Fecal occult blood test (FOBT) of stool. / Every year beginning at age 50 years and continuing until age 75 years. You may not need to do this test if you get a colonoscopy every 10 years.  Flexible sigmoidoscopy or colonoscopy.** / Every 5 years for a flexible sigmoidoscopy or every 10 years for a colonoscopy beginning at age 50 years and continuing until age 75 years.  Hepatitis C blood test.** / For all people born from 1945 through 1965 and any individual with known risks for hepatitis C.  Osteoporosis screening.** / A one-time screening for women ages 65 years and over and women at risk for fractures or osteoporosis.  Skin self-exam. / Monthly.  Influenza vaccine. / Every year.  Tetanus, diphtheria, and acellular pertussis (Tdap/Td) vaccine.** / 1 dose of Td every 10 years.  Varicella vaccine.** / Consult your health care provider.  Zoster vaccine.** / 1 dose for adults aged 60 years or older.  Pneumococcal 13-valent conjugate (PCV13) vaccine.** / Consult your health care provider.  Pneumococcal  polysaccharide (PPSV23) vaccine.** / 1 dose for all adults aged 65 years and older.  Meningococcal vaccine.** / Consult your health care provider.  Hepatitis A vaccine.** / Consult your health care provider.  Hepatitis B vaccine.** / Consult your health care provider.  Haemophilus influenzae type b (Hib) vaccine.** / Consult your health care provider.   Health Maintenance Adopting a healthy lifestyle and getting preventive care can go a long way to promote health and wellness. Talk with your health care provider about what schedule of regular examinations is right for you. This is a good chance for you to check in with your provider about disease prevention and staying healthy. In between checkups, there are plenty of things you can do on your own. Experts have done a lot of research about which lifestyle changes and preventive measures are most likely to keep you healthy. Ask your health care provider for more information. WEIGHT AND DIET  Eat a healthy diet Be sure to include plenty of vegetables,   fruits, low-fat dairy products, and lean protein. Do not eat a lot of foods high in solid fats, added sugars, or salt. Get regular exercise. This is one of the most important things you can do for your health. Most adults should exercise for at least 150 minutes each week. The exercise should increase your heart rate and make you sweat (moderate-intensity exercise). Most adults should also do strengthening exercises at least twice a week. This is in addition to the moderate-intensity exercise.  Maintain a healthy weight Body mass index (BMI) is a measurement that can be used to identify possible weight problems. It estimates body fat based on height and weight. Your health care provider can help determine your BMI and help you achieve or maintain a healthy weight. For females 20 years of age and older:  A BMI below 18.5 is considered underweight. A BMI of 18.5 to 24.9 is normal. A BMI of 25 to  29.9 is considered overweight. A BMI of 30 and above is considered obese.  Watch levels of cholesterol and blood lipids You should start having your blood tested for lipids and cholesterol at 67 years of age, then have this test every 5 years. You may need to have your cholesterol levels checked more often if: Your lipid or cholesterol levels are high. You are older than 67 years of age. You are at high risk for heart disease.  CANCER SCREENING   Lung Cancer Lung cancer screening is recommended for adults 55-80 years old who are at high risk for lung cancer because of a history of smoking. A yearly low-dose CT scan of the lungs is recommended for people who: Currently smoke. Have quit within the past 15 years. Have at least a 30-pack-year history of smoking. A pack year is smoking an average of one pack of cigarettes a day for 1 year. Yearly screening should continue until it has been 15 years since you quit. Yearly screening should stop if you develop a health problem that would prevent you from having lung cancer treatment.  Breast Cancer Practice breast self-awareness. This means understanding how your breasts normally appear and feel. It also means doing regular breast self-exams. Let your health care provider know about any changes, no matter how small. If you are in your 20s or 30s, you should have a clinical breast exam (CBE) by a health care provider every 1-3 years as part of a regular health exam. If you are 40 or older, have a CBE every year. Also consider having a breast X-ray (mammogram) every year. If you have a family history of breast cancer, talk to your health care provider about genetic screening. If you are at high risk for breast cancer, talk to your health care provider about having an MRI and a mammogram every year. Breast cancer gene (BRCA) assessment is recommended for women who have family members with BRCA-related cancers. BRCA-related cancers  include: Breast. Ovarian. Tubal. Peritoneal cancers. Results of the assessment will determine the need for genetic counseling and BRCA1 and BRCA2 testing. Cervical Cancer Routine pelvic examinations to screen for cervical cancer are no longer recommended for nonpregnant women who are considered low risk for cancer of the pelvic organs (ovaries, uterus, and vagina) and who do not have symptoms. A pelvic examination may be necessary if you have symptoms including those associated with pelvic infections. Ask your health care provider if a screening pelvic exam is right for you.  The Pap test is the screening test for cervical cancer for   women who are considered at risk. If you had a hysterectomy for a problem that was not cancer or a condition that could lead to cancer, then you no longer need Pap tests. If you are older than 65 years, and you have had normal Pap tests for the past 10 years, you no longer need to have Pap tests. If you have had past treatment for cervical cancer or a condition that could lead to cancer, you need Pap tests and screening for cancer for at least 20 years after your treatment. If you no longer get a Pap test, assess your risk factors if they change (such as having a new sexual partner). This can affect whether you should start being screened again. Some women have medical problems that increase their chance of getting cervical cancer. If this is the case for you, your health care provider may recommend more frequent screening and Pap tests. The human papillomavirus (HPV) test is another test that may be used for cervical cancer screening. The HPV test looks for the virus that can cause cell changes in the cervix. The cells collected during the Pap test can be tested for HPV. The HPV test can be used to screen women 72 years of age and older. Getting tested for HPV can extend the interval between normal Pap tests from three to five years. An HPV test also should be used to  screen women of any age who have unclear Pap test results. After 67 years of age, women should have HPV testing as often as Pap tests.  Colorectal Cancer This type of cancer can be detected and often prevented. Routine colorectal cancer screening usually begins at 67 years of age and continues through 67 years of age. Your health care provider may recommend screening at an earlier age if you have risk factors for colon cancer. Your health care provider may also recommend using home test kits to check for hidden blood in the stool. A small camera at the end of a tube can be used to examine your colon directly (sigmoidoscopy or colonoscopy). This is done to check for the earliest forms of colorectal cancer. Routine screening usually begins at age 58. Direct examination of the colon should be repeated every 5-10 years through 67 years of age. However, you may need to be screened more often if early forms of precancerous polyps or small growths are found. Skin Cancer Check your skin from head to toe regularly. Tell your health care provider about any new moles or changes in moles, especially if there is a change in a mole's shape or color. Also tell your health care provider if you have a mole that is larger than the size of a pencil eraser. Always use sunscreen. Apply sunscreen liberally and repeatedly throughout the day. Protect yourself by wearing long sleeves, pants, a wide-brimmed hat, and sunglasses whenever you are outside. HEART DISEASE, DIABETES, AND HIGH BLOOD PRESSURE  Have your blood pressure checked at least every 1-2 years. High blood pressure causes heart disease and increases the risk of stroke. If you are between 61 years and 77 years old, ask your health care provider if you should take aspirin to prevent strokes. Have regular diabetes screenings. This involves taking a blood sample to check your fasting blood sugar level. If you are at a normal weight and have a low risk for  diabetes, have this test once every three years after 66 years of age. If you are overweight and have a high risk  for diabetes, consider being tested at a younger age or more often. PREVENTING INFECTION  Hepatitis B If you have a higher risk for hepatitis B, you should be screened for this virus. You are considered at high risk for hepatitis B if: You were born in a country where hepatitis B is common. Ask your health care provider which countries are considered high risk. Your parents were born in a high-risk country, and you have not been immunized against hepatitis B (hepatitis B vaccine). You have HIV or AIDS. You use needles to inject street drugs. You live with someone who has hepatitis B. You have had sex with someone who has hepatitis B. You get hemodialysis treatment. You take certain medicines for conditions, including cancer, organ transplantation, and autoimmune conditions. Hepatitis C Blood testing is recommended for: Everyone born from 1945 through 1965. Anyone with known risk factors for hepatitis C. Sexually transmitted infections (STIs) You should be screened for sexually transmitted infections (STIs) including gonorrhea and chlamydia if: You are sexually active and are younger than 67 years of age. You are older than 67 years of age and your health care provider tells you that you are at risk for this type of infection. Your sexual activity has changed since you were last screened and you are at an increased risk for chlamydia or gonorrhea. Ask your health care provider if you are at risk. If you do not have HIV, but are at risk, it may be recommended that you take a prescription medicine daily to prevent HIV infection. This is called pre-exposure prophylaxis (PrEP). You are considered at risk if: You are sexually active and do not regularly use condoms or know the HIV status of your partner(s). You take drugs by injection. You are sexually active with a partner who has  HIV. Talk with your health care provider about whether you are at high risk of being infected with HIV. If you choose to begin PrEP, you should first be tested for HIV. You should then be tested every 3 months for as long as you are taking PrEP.  PREGNANCY  If you are premenopausal and you may become pregnant, ask your health care provider about preconception counseling. If you may become pregnant, take 400 to 800 micrograms (mcg) of folic acid every day. If you want to prevent pregnancy, talk to your health care provider about birth control (contraception). OSTEOPOROSIS AND MENOPAUSE  Osteoporosis is a disease in which the bones lose minerals and strength with aging. This can result in serious bone fractures. Your risk for osteoporosis can be identified using a bone density scan. If you are 65 years of age or older, or if you are at risk for osteoporosis and fractures, ask your health care provider if you should be screened. Ask your health care provider whether you should take a calcium or vitamin D supplement to lower your risk for osteoporosis. Menopause may have certain physical symptoms and risks. Hormone replacement therapy may reduce some of these symptoms and risks. Talk to your health care provider about whether hormone replacement therapy is right for you.  HOME CARE INSTRUCTIONS  Schedule regular health, dental, and eye exams. Stay current with your immunizations.  Do not use any tobacco products including cigarettes, chewing tobacco, or electronic cigarettes. If you are pregnant, do not drink alcohol. If you are breastfeeding, limit how much and how often you drink alcohol. Limit alcohol intake to no more than 1 drink per day for nonpregnant women. One drink equals 12   ounces of beer, 5 ounces of wine, or 1 ounces of hard liquor. Do not use street drugs. Do not share needles. Ask your health care provider for help if you need support or information about quitting drugs. Tell your  health care provider if you often feel depressed. Tell your health care provider if you have ever been abused or do not feel safe at home

## 2016-07-05 LAB — URINALYSIS, ROUTINE W REFLEX MICROSCOPIC
BILIRUBIN URINE: NEGATIVE
GLUCOSE, UA: NEGATIVE
Hgb urine dipstick: NEGATIVE
KETONES UR: NEGATIVE
Nitrite: NEGATIVE
PH: 6.5 (ref 5.0–8.0)
Protein, ur: NEGATIVE
SPECIFIC GRAVITY, URINE: 1.023 (ref 1.001–1.035)

## 2016-07-05 LAB — INSULIN, RANDOM: INSULIN: 13.6 u[IU]/mL (ref 2.0–19.6)

## 2016-07-05 LAB — URINALYSIS, MICROSCOPIC ONLY
CRYSTALS: NONE SEEN [HPF]
Casts: NONE SEEN [LPF]
Yeast: NONE SEEN [HPF]

## 2016-07-05 LAB — MICROALBUMIN / CREATININE URINE RATIO
CREATININE, URINE: 199 mg/dL (ref 20–320)
MICROALB UR: 1.3 mg/dL
Microalb Creat Ratio: 7 mcg/mg creat (ref <30)

## 2016-07-05 LAB — VITAMIN D 25 HYDROXY (VIT D DEFICIENCY, FRACTURES): Vit D, 25-Hydroxy: 81 ng/mL (ref 30–100)

## 2016-07-05 LAB — HEMOGLOBIN A1C
Hgb A1c MFr Bld: 6 % — ABNORMAL HIGH (ref <5.7)
MEAN PLASMA GLUCOSE: 126 mg/dL

## 2016-07-29 ENCOUNTER — Other Ambulatory Visit: Payer: Self-pay | Admitting: *Deleted

## 2016-07-29 DIAGNOSIS — Z1212 Encounter for screening for malignant neoplasm of rectum: Secondary | ICD-10-CM

## 2016-07-29 LAB — POC HEMOCCULT BLD/STL (HOME/3-CARD/SCREEN)
Card #3 Date: NEGATIVE
FECAL OCCULT BLD: NEGATIVE
Fecal Occult Blood, POC: NEGATIVE

## 2016-10-17 ENCOUNTER — Ambulatory Visit: Payer: Self-pay | Admitting: Internal Medicine

## 2016-10-20 ENCOUNTER — Ambulatory Visit (INDEPENDENT_AMBULATORY_CARE_PROVIDER_SITE_OTHER): Payer: Medicare HMO | Admitting: Internal Medicine

## 2016-10-20 ENCOUNTER — Encounter: Payer: Self-pay | Admitting: Internal Medicine

## 2016-10-20 VITALS — BP 122/66 | HR 96 | Temp 98.0°F | Resp 18 | Ht 60.5 in | Wt 150.0 lb

## 2016-10-20 DIAGNOSIS — R05 Cough: Secondary | ICD-10-CM

## 2016-10-20 DIAGNOSIS — R059 Cough, unspecified: Secondary | ICD-10-CM

## 2016-10-20 MED ORDER — AZITHROMYCIN 250 MG PO TABS: ORAL_TABLET | ORAL | 0 refills | Status: DC

## 2016-10-20 MED ORDER — ALBUTEROL SULFATE HFA 108 (90 BASE) MCG/ACT IN AERS: 2.0000 | INHALATION_SPRAY | RESPIRATORY_TRACT | 0 refills | Status: DC | PRN

## 2016-10-20 MED ORDER — PREDNISONE 20 MG PO TABS: ORAL_TABLET | ORAL | 0 refills | Status: DC

## 2016-10-20 MED ORDER — FLUTICASONE PROPIONATE 50 MCG/ACT NA SUSP: 2.0000 | Freq: Every day | NASAL | 0 refills | Status: DC

## 2016-10-20 MED ORDER — IPRATROPIUM-ALBUTEROL 0.5-2.5 (3) MG/3ML IN SOLN
3.0000 mL | Freq: Once | RESPIRATORY_TRACT | Status: AC
  Administered 2016-10-20: 3 mL via RESPIRATORY_TRACT

## 2016-10-20 MED ORDER — PROMETHAZINE-DM 6.25-15 MG/5ML PO SYRP: ORAL_SOLUTION | ORAL | 1 refills | Status: DC

## 2016-10-20 NOTE — Progress Notes (Signed)
HPI  Patient presents to the office for evaluation of cough and congestion.  It has been going on for 8 days.  Patient reports night > day, wet, worse with lying down, mild yellow sputum production.  They also endorse change in voice, postnasal drip, sputum production, wheezing and nasal congestion, sore throat, ears are popping.  No dizziness or lithheadedness.  .  They have tried chloraceden and tylenol.  They report that nothing has worked.  They admits to other sick contacts.  Review of Systems  Constitutional: Positive for malaise/fatigue. Negative for chills and fever.  HENT: Positive for congestion, ear pain, hearing loss and sore throat.   Respiratory: Positive for cough and sputum production. Negative for shortness of breath and wheezing.   Cardiovascular: Negative for chest pain, palpitations and leg swelling.  Neurological: Positive for headaches.    PE:  Vitals:   10/20/16 1510  BP: 122/66  Pulse: 96  Resp: 18  Temp: 98 F (36.7 C)    General:  Alert and non-toxic, WDWN, NAD HEENT: NCAT, PERLA, EOM normal, no occular discharge or erythema.  Nasal mucosal edema with sinus tenderness to palpation.  Oropharynx clear with minimal oropharyngeal edema and erythema.  Mucous membranes moist and pink. Neck:  Cervical adenopathy Chest:  RRR no MRGs.  Lungs clear to auscultation A&P with no wheezes rhonchi or rales.   Abdomen: +BS x 4 quadrants, soft, non-tender, no guarding, rigidity, or rebound. Skin: warm and dry no rash Neuro: A&Ox4, CN II-XII grossly intact  Assessment and Plan:   1. Cough -albuterol 6hrs prn for cough of shortness of breath -zyrect -flonase -zpak -prednisone -phenergan dm as needed for cough -cont singulair -cont nasal saline - ipratropium-albuterol (DUONEB) 0.5-2.5 (3) MG/3ML nebulizer solution 3 mL; Take 3 mLs by nebulization once.

## 2016-12-20 ENCOUNTER — Other Ambulatory Visit: Payer: Self-pay | Admitting: Internal Medicine

## 2016-12-20 NOTE — Telephone Encounter (Signed)
Phentermine was called into pharmacy @ 2:06 pm on 3rd April 2018 by DD

## 2017-01-03 ENCOUNTER — Other Ambulatory Visit: Payer: Self-pay | Admitting: *Deleted

## 2017-01-03 MED ORDER — BISOPROLOL-HYDROCHLOROTHIAZIDE 5-6.25 MG PO TABS: 1.0000 | ORAL_TABLET | Freq: Every day | ORAL | 1 refills | Status: DC

## 2017-01-03 MED ORDER — SIMVASTATIN 20 MG PO TABS: ORAL_TABLET | ORAL | 1 refills | Status: DC

## 2017-01-03 MED ORDER — METFORMIN HCL ER 500 MG PO TB24: 1000.0000 mg | ORAL_TABLET | Freq: Two times a day (BID) | ORAL | 1 refills | Status: DC

## 2017-01-03 MED ORDER — HYDROCHLOROTHIAZIDE 25 MG PO TABS: ORAL_TABLET | ORAL | 1 refills | Status: DC

## 2017-01-03 MED ORDER — MONTELUKAST SODIUM 10 MG PO TABS: ORAL_TABLET | ORAL | 1 refills | Status: DC

## 2017-01-12 DIAGNOSIS — S46911A Strain of unspecified muscle, fascia and tendon at shoulder and upper arm level, right arm, initial encounter: Secondary | ICD-10-CM | POA: Diagnosis not present

## 2017-01-22 NOTE — Progress Notes (Signed)
This very nice 66 y.o. MWF presents for 6 month follow up with Hypertension, Hyperlipidemia, Pre-Diabetes and Vitamin D Deficiency.      Patient is treated for HTN (2004) & BP has been controlled at home. Today's BP is at goal - 136/76. Patient has had no complaints of any cardiac type chest pain, palpitations, dyspnea/orthopnea/PND, dizziness, claudication, or dependent edema.       Patient also expresses concerns re: recurrent non healing "sore" over her Rt upper lip.     Hyperlipidemia is controlled with diet & meds. Patient denies myalgias or other med SE's. Last Lipids were at goal: Lab Results  Component Value Date   CHOL 152 07/04/2016   HDL 43 (L) 07/04/2016   LDLCALC 80 07/04/2016   TRIG 146 07/04/2016   CHOLHDL 3.5 07/04/2016      Also, the patient has history of T2_NIDDM (2004) and has had no symptoms of reactive hypoglycemia, diabetic polys, paresthesias or visual blurring.  Last A1c was not at goal: Lab Results  Component Value Date   HGBA1C 6.0 (H) 07/04/2016      Further, the patient also has history of Vitamin D Deficiency ("37" in Oct 2014)  and supplements vitamin D without any suspected side-effects. Last vitamin D was at goal: Lab Results  Component Value Date   VD25OH 81 07/04/2016   Current Outpatient Prescriptions on File Prior to Visit  Medication Sig  . albuterol  HFA inhaler 2 puffs  every 2 (two) hours as needed   . aspirin 81 MG  Chew by mouth daily.  . bisoprolol-hctz 5-6.25  Take 1 tablet by mouth daily.  . cetirizine 10 MG tablet Take 10 mg by mouth daily.  Marland Kitchen VITAMIN D Take 6,000 Int'l Units by mouth daily.   Marland Kitchen SLOW FE 160 MG  Take 160 mg by mouth daily.  Marland Kitchen FLONASE  nasal spray Place 2 sprays into both nostrils daily.  . hctz 25 MG tablet TAKE 1 TABLET  DAILY FOR BLOOD PRESSURE AND FLUID  . Magnesium 500 MG CAPS Take 500 mg by mouth daily.  . meclizine25 MG tablet TAKE ONE TAB 3 x DAILY AS NEEDED FOR DIZZINESS  . metFORMIN-XR 500 MG  Take 2  tab  2 x  daily.  . montelukast  10 MG tablet TAKE 1 TABLET EVERY DAY  FOR  ALLERGIES  . One Source supplement  Take 1 tab 2 times daily for hair,skin and nails  . phentermine 37.5 MG tablet TAKE ONE TABLET BY MOUTH IN THE MORNING  . promethazine  25 MG tablet TAKE ONE TABLET BY MOUTH ONCE DAILY AS NEEDED  . simvastatin20 MG tablet TAKE 1 TABLET AT BEDTIME FOR CHOLESTEROL   Allergies  Allergen Reactions  . Penicillins    PMHx:   Past Medical History:  Diagnosis Date  . Allergy   . Hyperlipidemia   . Hypertension   . Iron deficiency   . Meniere's disease   . Neuropathy ([place])   . Type II or unspecified type diabetes mellitus without mention of complication, not stated as uncontrolled   . Vitamin D deficiency    Immunization History  Administered Date(s) Administered  . Influenza Split 07/16/2013, 07/24/2014  . Influenza, High Dose Seasonal PF 06/15/2015, 07/04/2016  . PPD Test 04/14/2014  . Pneumococcal Conjugate-13 10/31/2014  . Pneumococcal Polysaccharide-23 12/01/2004, 03/23/2016  . Tdap 04/06/2011  . Zoster 07/16/2013   Past Surgical History:  Procedure Laterality Date  . [place]  BSO  . ORIF ANKLE FRACTURE Left 2000   FHx:    Reviewed / unchanged  SHx:    Reviewed / unchanged  Systems Review:  Constitutional: Denies fever, chills, wt changes, headaches, insomnia, fatigue, night sweats, change in appetite. Eyes: Denies redness, blurred vision, diplopia, discharge, itchy, watery eyes.  ENT: Denies discharge, congestion, post nasal drip, epistaxis, sore throat, earache, hearing loss, dental pain, tinnitus, vertigo, sinus pain, snoring.  CV: Denies chest pain, palpitations, irregular heartbeat, syncope, dyspnea, diaphoresis, orthopnea, PND, claudication or edema. Respiratory: denies cough, dyspnea, DOE, pleurisy, hoarseness, laryngitis, wheezing.  Gastrointestinal: Denies dysphagia, odynophagia, heartburn, reflux, water brash, abdominal pain or  cramps, nausea, vomiting, bloating, diarrhea, constipation, hematemesis, melena, hematochezia  or hemorrhoids. Genitourinary: Denies dysuria, frequency, urgency, nocturia, hesitancy, discharge, hematuria or flank pain. Musculoskeletal: Denies arthralgias, myalgias, stiffness, jt. swelling, pain, limping or strain/sprain.  Skin: Denies pruritus, rash, hives, warts, acne, eczema or change in skin lesion(s). Neuro: No weakness, tremor, incoordination, spasms, paresthesia or pain. Psychiatric: Denies confusion, memory loss or sensory loss. Endo: Denies change in weight, skin or hair change.  Heme/Lymph: No excessive bleeding, bruising or enlarged lymph nodes.  Physical Exam  BP 136/76   Pulse 84   Temp 97.1 F (36.2 C)   Resp 16   Ht 5' 0.5" (1.537 m)   Wt 151 lb 9.6 oz (68.8 kg)   BMI 29.12 kg/m   Appears well nourished, well groomed  and in no distress.  Eyes: PERRLA, EOMs, conjunctiva no swelling or erythema. Sinuses: No frontal/maxillary tenderness ENT/Mouth: EAC's clear, TM's nl w/o erythema, bulging. Nares clear w/o erythema, swelling, exudates. Oropharynx clear without erythema or exudates. Oral hygiene is good. Tongue normal, non obstructing. Hearing intact.  Neck: Supple. Thyroid nl. Car 2+/2+ without bruits, nodes or JVD. Chest: Respirations nl with BS clear & equal w/o rales, rhonchi, wheezing or stridor.  Cor: Heart sounds normal w/ regular rate and rhythm without sig. murmurs, gallops, clicks or rubs. Peripheral pulses normal and equal  without edema.  Abdomen: Soft & bowel sounds normal. Non-tender w/o guarding, rebound, hernias, masses or organomegaly.  Lymphatics: Unremarkable.  Musculoskeletal: Full ROM all peripheral extremities, joint stability, 5/5 strength and normal gait.  Skin: Warm, dry without exposed rashes or ecchymosis apparent.   There is a 3 mm pink raises lesion above the Rt upper lip.   Neuro: Cranial nerves intact, reflexes equal bilaterally.  Sensory-motor testing grossly intact. Tendon reflexes grossly intact.  Pysch: Alert & oriented x 3.  Insight and judgement nl & appropriate. No ideations.  Assessment and Plan:  1. Essential hypertension  - Continue medication, monitor blood pressure at home.  - Continue DASH diet. Reminder to go to the ER if any CP,  SOB, nausea, dizziness, severe HA, changes vision/speech,  left arm numbness and tingling and jaw pain.   - CBC with Differential/Platelet - BASIC METABOLIC PANEL WITH GFR - Magnesium - TSH  2. Hyperlipidemia, mixed  - Continue diet/meds, exercise,& lifestyle modifications.  - Continue monitor periodic cholesterol/liver & renal functions   - Hepatic function panel - Lipid panel - TSH  3. Type 2 diabetes mellitus with sensory neuropathy (HCC)  - Continue diet, exercise, lifestyle modifications.  - Monitor appropriate labs.  - Hemoglobin A1c - Insulin, random  4. Vitamin D deficiency  - Continue supplementation.  - VITAMIN D 25 Hydroxy  5. Medication management  - CBC with Differential/Platelet - BASIC METABOLIC PANEL WITH GFR - Hepatic function panel - Magnesium - Lipid panel -  TSH - Hemoglobin A1c - Insulin, random - VITAMIN D 25 Hydroxy    6. Skin lesion, Ukn Significance , face  - Dermatology referral to [place]        Discussed  regular exercise, BP monitoring, weight control to achieve/maintain BMI less than 25 and discussed med and SE's. Recommended labs to assess and monitor clinical status with further disposition pending results of labs. Over 30 minutes of exam, counseling, chart review was performed.

## 2017-01-23 ENCOUNTER — Ambulatory Visit (INDEPENDENT_AMBULATORY_CARE_PROVIDER_SITE_OTHER): Payer: Medicare HMO | Admitting: Internal Medicine

## 2017-01-23 ENCOUNTER — Encounter: Payer: Self-pay | Admitting: Internal Medicine

## 2017-01-23 VITALS — BP 136/76 | HR 84 | Temp 97.1°F | Resp 16 | Ht 60.5 in | Wt 151.6 lb

## 2017-01-23 DIAGNOSIS — E559 Vitamin D deficiency, unspecified: Secondary | ICD-10-CM | POA: Diagnosis not present

## 2017-01-23 DIAGNOSIS — D485 Neoplasm of uncertain behavior of skin: Secondary | ICD-10-CM | POA: Diagnosis not present

## 2017-01-23 DIAGNOSIS — I10 Essential (primary) hypertension: Secondary | ICD-10-CM

## 2017-01-23 DIAGNOSIS — Z79899 Other long term (current) drug therapy: Secondary | ICD-10-CM

## 2017-01-23 DIAGNOSIS — E1140 Type 2 diabetes mellitus with diabetic neuropathy, unspecified: Secondary | ICD-10-CM

## 2017-01-23 DIAGNOSIS — E782 Mixed hyperlipidemia: Secondary | ICD-10-CM | POA: Diagnosis not present

## 2017-01-23 LAB — CBC WITH DIFFERENTIAL/PLATELET
BASOS PCT: 0 %
Basophils Absolute: 0 cells/uL (ref 0–200)
EOS ABS: 339 {cells}/uL (ref 15–500)
Eosinophils Relative: 3 %
HEMATOCRIT: 38.1 % (ref 35.0–45.0)
HEMOGLOBIN: 12.4 g/dL (ref 11.7–15.5)
LYMPHS ABS: 3616 {cells}/uL (ref 850–3900)
LYMPHS PCT: 32 %
MCH: 26.6 pg — ABNORMAL LOW (ref 27.0–33.0)
MCHC: 32.5 g/dL (ref 32.0–36.0)
MCV: 81.8 fL (ref 80.0–100.0)
MONO ABS: 791 {cells}/uL (ref 200–950)
MPV: 9.9 fL (ref 7.5–12.5)
Monocytes Relative: 7 %
Neutro Abs: 6554 cells/uL (ref 1500–7800)
Neutrophils Relative %: 58 %
Platelets: 284 10*3/uL (ref 140–400)
RBC: 4.66 MIL/uL (ref 3.80–5.10)
RDW: 14.5 % (ref 11.0–15.0)
WBC: 11.3 10*3/uL — ABNORMAL HIGH (ref 3.8–10.8)

## 2017-01-23 LAB — TSH: TSH: 2.47 m[IU]/L

## 2017-01-23 NOTE — Patient Instructions (Signed)

## 2017-01-24 LAB — HEPATIC FUNCTION PANEL
ALBUMIN: 4.2 g/dL (ref 3.6–5.1)
ALK PHOS: 64 U/L (ref 33–130)
ALT: 21 U/L (ref 6–29)
AST: 20 U/L (ref 10–35)
Bilirubin, Direct: 0.1 mg/dL (ref <=0.2)
Indirect Bilirubin: 0.3 mg/dL (ref 0.2–1.2)
TOTAL PROTEIN: 6.9 g/dL (ref 6.1–8.1)
Total Bilirubin: 0.4 mg/dL (ref 0.2–1.2)

## 2017-01-24 LAB — BASIC METABOLIC PANEL WITH GFR
BUN: 14 mg/dL (ref 7–25)
CO2: 28 mmol/L (ref 20–31)
CREATININE: 0.56 mg/dL (ref 0.50–0.99)
Calcium: 9.9 mg/dL (ref 8.6–10.4)
Chloride: 97 mmol/L — ABNORMAL LOW (ref 98–110)
GFR, Est Non African American: >89 mL/min (ref >=60)
GLUCOSE: 118 mg/dL — AB (ref 65–99)
POTASSIUM: 3.8 mmol/L (ref 3.5–5.3)
Sodium: 137 mmol/L (ref 135–146)

## 2017-01-24 LAB — LIPID PANEL
Cholesterol: 155 mg/dL (ref <200)
HDL: 43 mg/dL — ABNORMAL LOW (ref >50)
LDL CALC: 78 mg/dL (ref <100)
TRIGLYCERIDES: 169 mg/dL — AB (ref <150)
Total CHOL/HDL Ratio: 3.6 Ratio (ref <5.0)
VLDL: 34 mg/dL — ABNORMAL HIGH (ref <30)

## 2017-01-24 LAB — MAGNESIUM: MAGNESIUM: 1.6 mg/dL (ref 1.5–2.5)

## 2017-01-24 LAB — HEMOGLOBIN A1C
Hgb A1c MFr Bld: 6 % — ABNORMAL HIGH (ref <5.7)
MEAN PLASMA GLUCOSE: 126 mg/dL

## 2017-01-24 LAB — INSULIN, RANDOM: INSULIN: 21.7 u[IU]/mL — AB (ref 2.0–19.6)

## 2017-01-24 LAB — VITAMIN D 25 HYDROXY (VIT D DEFICIENCY, FRACTURES): Vit D, 25-Hydroxy: 78 ng/mL (ref 30–100)

## 2017-01-25 DIAGNOSIS — S46811D Strain of other muscles, fascia and tendons at shoulder and upper arm level, right arm, subsequent encounter: Secondary | ICD-10-CM | POA: Diagnosis not present

## 2017-02-03 DIAGNOSIS — S46811D Strain of other muscles, fascia and tendons at shoulder and upper arm level, right arm, subsequent encounter: Secondary | ICD-10-CM | POA: Diagnosis not present

## 2017-02-03 DIAGNOSIS — S46011D Strain of muscle(s) and tendon(s) of the rotator cuff of right shoulder, subsequent encounter: Secondary | ICD-10-CM | POA: Diagnosis not present

## 2017-02-17 HISTORY — PX: SHOULDER SURGERY: SHX246

## 2017-02-28 DIAGNOSIS — G4700 Insomnia, unspecified: Secondary | ICD-10-CM | POA: Diagnosis not present

## 2017-02-28 DIAGNOSIS — Z7982 Long term (current) use of aspirin: Secondary | ICD-10-CM | POA: Diagnosis not present

## 2017-02-28 DIAGNOSIS — I10 Essential (primary) hypertension: Secondary | ICD-10-CM | POA: Diagnosis not present

## 2017-02-28 DIAGNOSIS — H8109 Meniere's disease, unspecified ear: Secondary | ICD-10-CM | POA: Diagnosis not present

## 2017-02-28 DIAGNOSIS — H8113 Benign paroxysmal vertigo, bilateral: Secondary | ICD-10-CM | POA: Diagnosis not present

## 2017-02-28 DIAGNOSIS — H9112 Presbycusis, left ear: Secondary | ICD-10-CM | POA: Diagnosis not present

## 2017-02-28 DIAGNOSIS — G5603 Carpal tunnel syndrome, bilateral upper limbs: Secondary | ICD-10-CM | POA: Diagnosis not present

## 2017-02-28 DIAGNOSIS — Z79899 Other long term (current) drug therapy: Secondary | ICD-10-CM | POA: Diagnosis not present

## 2017-02-28 DIAGNOSIS — E119 Type 2 diabetes mellitus without complications: Secondary | ICD-10-CM | POA: Diagnosis not present

## 2017-02-28 DIAGNOSIS — E785 Hyperlipidemia, unspecified: Secondary | ICD-10-CM | POA: Diagnosis not present

## 2017-02-28 DIAGNOSIS — Z0000 Encounter for general adult medical examination without abnormal findings: Secondary | ICD-10-CM | POA: Diagnosis not present

## 2017-02-28 DIAGNOSIS — E669 Obesity, unspecified: Secondary | ICD-10-CM | POA: Diagnosis not present

## 2017-02-28 DIAGNOSIS — Z7984 Long term (current) use of oral hypoglycemic drugs: Secondary | ICD-10-CM | POA: Diagnosis not present

## 2017-03-14 DIAGNOSIS — M7541 Impingement syndrome of right shoulder: Secondary | ICD-10-CM | POA: Diagnosis not present

## 2017-03-14 DIAGNOSIS — G8918 Other acute postprocedural pain: Secondary | ICD-10-CM | POA: Diagnosis not present

## 2017-03-14 DIAGNOSIS — M24111 Other articular cartilage disorders, right shoulder: Secondary | ICD-10-CM | POA: Diagnosis not present

## 2017-03-14 DIAGNOSIS — S46091A Other injury of muscle(s) and tendon(s) of the rotator cuff of right shoulder, initial encounter: Secondary | ICD-10-CM | POA: Diagnosis not present

## 2017-03-14 DIAGNOSIS — M19011 Primary osteoarthritis, right shoulder: Secondary | ICD-10-CM | POA: Diagnosis not present

## 2017-03-14 DIAGNOSIS — S43431A Superior glenoid labrum lesion of right shoulder, initial encounter: Secondary | ICD-10-CM | POA: Diagnosis not present

## 2017-03-14 DIAGNOSIS — M75121 Complete rotator cuff tear or rupture of right shoulder, not specified as traumatic: Secondary | ICD-10-CM | POA: Diagnosis not present

## 2017-04-24 NOTE — Progress Notes (Signed)
MEDICARE ANNUAL WELLNESS VISIT AND FOLLOW UP  Assessment:    Essential hypertension - continue medications, DASH diet, exercise and monitor at home. Call if greater than 130/80.  - CBC with Differential/Platelet - BASIC METABOLIC PANEL WITH GFR - Hepatic function panel - TSH  Type 2 diabetes mellitus with sensory neuropathy ([place]) Discussed general issues about diabetes pathophysiology and management., Educational material distributed., Suggested low cholesterol diet., Encouraged aerobic exercise., Discussed foot care., Reminded to get yearly retinal exam. - Hemoglobin A1c  Morbid obesity, unspecified obesity type ([place]) Obesity with co morbidities- long discussion about weight loss, diet, and exercise - Lipid panel - Hemoglobin A1c  Hyperlipidemia -continue medications, check lipids, decrease fatty foods, increase activity.  - Lipid panel  Vitamin D deficiency Continue supplement   Medication management - Magnesium  Meniere's disease, unspecified laterality Continue HCTZ, monitor kidney function  Medicare annual wellness visit, subsequent  Estrogen deficiency - DG Bone Density; Future  Advanced care counseling/discussion Discussed advanced care planning with patient  Fatty liver Weight loss advised, avoid alcohol/tylenol, will monitor LFTs  Aftercare following right shoulder joint replacement surgery Continue PT  Over 30 minutes of exam, counseling, chart review, and critical decision making was performed  Future Appointments Date Time Provider [place]  08/01/2017 9:00 AM [name], MD [facility] None    Plan:   During the course of the visit the patient was educated and counseled about appropriate screening and preventive services including:    Pneumococcal vaccine   Influenza vaccine  Td vaccine  Prevnar 13  Screening electrocardiogram  Screening mammography  Bone densitometry screening  Colorectal cancer screening  Diabetes  screening  Glaucoma screening  Nutrition counseling   Advanced directives: given info/requested copies   Subjective:   Regina Cervantes is a 66 y.o. female who presents for Medicare Annual Wellness Visit and 3 month follow up on hypertension, diabetes with DM neuropathy, hyperlipidemia, vitamin D def.   Her blood pressure has been controlled at home, today their BP is BP: 126/74  She does not workout regularly, walks 1-2 x a week for 15-20 mins. She denies chest pain, shortness of breath, dizziness. Had arthroscopic right shoulder surgery, rotator cuff repair, with Dr. Augustin Coupe June 26th, starts PT.  She has seen Dr. Lucia Gaskins for her menere's, she is back on her HCTZ.  She is on cholesterol medication and denies myalgias. Her cholesterol is at goal. The cholesterol last visit was:   Lab Results  Component Value Date   CHOL 155 01/23/2017   HDL 43 (L) 01/23/2017   LDLCALC 78 01/23/2017   TRIG 169 (H) 01/23/2017   CHOLHDL 3.6 01/23/2017    She was first diagnosed with DM 10-15 years ago, has been working on diet and exercise for diabetes with DM neuropathy, she has done a great job bringing down her A1C with weight loss/phentermine, she is on bASA, she is not on ACE patient declines at this time, she is on metformin, she is on metformin, and denies polydipsia, polyuria and visual disturbances. Last A1C in the office was:  Lab Results  Component Value Date   HGBA1C 6.0 (H) 01/23/2017   Patient is on Vitamin D supplement.   Lab Results  Component Value Date   VD25OH 78 01/23/2017     BMI is Body mass index is 29.97 kg/m., she is working on diet and exercise. She had a AHI of 4.4 with sleep study in 2015.She has been off the phentermine for 2 months and she is doing well.  Wt Readings from Last 3 Encounters:  04/25/17 156 lb (70.8 kg)  01/23/17 151 lb 9.6 oz (68.8 kg)  10/20/16 150 lb (68 kg)    Medication Review Current Outpatient Prescriptions on File Prior to Visit  Medication  Sig  . aspirin 81 MG chewable tablet Chew by mouth daily.  Marland Kitchen BAYER MICROLET LANCETS lancets Use as instructed  . bisoprolol-hydrochlorothiazide (ZIAC) 5-6.25 MG tablet Take 1 tablet by mouth daily.  . Blood Glucose Monitoring Suppl (CONTOUR BLOOD GLUCOSE SYSTEM) DEVI Test Blood sugar once daily  . cetirizine (ZYRTEC) 10 MG tablet Take 10 mg by mouth daily.  . Cholecalciferol (VITAMIN D PO) Take 6,000 Int'l Units by mouth daily.   . ferrous sulfate dried (SLOW FE) 160 (50 FE) MG TBCR SR tablet Take 160 mg by mouth daily.  Marland Kitchen glucose blood (BAYER CONTOUR TEST) test strip Use as instructed  . hydrochlorothiazide (HYDRODIURIL) 25 MG tablet TAKE 1 TABLET  DAILY FOR BLOOD PRESSURE AND FLUID  . Magnesium 500 MG CAPS Take 500 mg by mouth daily.  . meclizine (ANTIVERT) 25 MG tablet TAKE ONE TABLET BY MOUTH THREE TIMES DAILY AS NEEDED FOR DIZZINESS  . metFORMIN (GLUCOPHAGE-XR) 500 MG 24 hr tablet Take 2 tablets (1,000 mg total) by mouth 2 (two) times daily.  . montelukast (SINGULAIR) 10 MG tablet TAKE 1 TABLET EVERY DAY  FOR  ALLERGIES  . OVER THE COUNTER MEDICATION One Source supplement 2 times daily for hair,skin and nails  . OVER THE COUNTER MEDICATION Takes LipoFlavonoid 1 tablet BID  . OVER THE COUNTER MEDICATION Nasogel 2 squirts each nostril BID PRN.  Marland Kitchen phentermine (ADIPEX-P) 37.5 MG tablet TAKE ONE TABLET BY MOUTH IN THE MORNING  . promethazine (PHENERGAN) 25 MG tablet TAKE ONE TABLET BY MOUTH ONCE DAILY AS NEEDED FOR DIZZINESS  . simvastatin (ZOCOR) 20 MG tablet TAKE 1 TABLET AT BEDTIME FOR CHOLESTEROL   No current facility-administered medications on file prior to visit.     Allergies: Allergies  Allergen Reactions  . Penicillins     Current Problems (verified) has Hyperlipidemia; Hypertension; Vitamin D deficiency; Meniere's disease; Medication management; Morbid obesity (BMI 30.81); Medicare annual wellness visit, subsequent; Type 2 diabetes mellitus with sensory neuropathy ([place]); and  Fatty liver on her problem list.  Screening Tests Immunization History  Administered Date(s) Administered  . Influenza Split 07/16/2013, 07/24/2014  . Influenza, High Dose Seasonal PF 06/15/2015, 07/04/2016  . PPD Test 04/14/2014  . Pneumococcal Conjugate-13 10/31/2014  . Pneumococcal Polysaccharide-23 12/01/2004, 03/23/2016  . Tdap 04/06/2011  . Zoster 07/16/2013    Preventative care: Last colonoscopy: 2010 Last mammogram: 02/2016 Last pap smear/pelvic exam: remote, declines another DEXA: will get next year with MGM CXR:11/2010 [place] AB 2014  Prior vaccinations: TD or Tdap: 2012  Influenza: 2017 Pneumococcal: 2017 Prevnar13: 2016 Shingles/Zostavax: 2014  Names of Other Physician/Practitioners you currently use: 1. [facility]- here for primary care 2. [facility], [place], eye doctor, last visit July 2018, wears glasses, got new ones 3. Dr. Bjorn Pippin, dentist, last visit 2018 Patient Care Team: [name], MD as PCP - General (Internal Medicine) Inda Castle, MD as Consulting Physician (Gastroenterology) Rozetta Nunnery, MD as Consulting Physician (Otolaryngology)  Surgical: She  has a past surgical history that includes ORIF ankle fracture (Left, 2000); Abdominal hysterectomy (1998); and Shoulder surgery (Right, 02/2017). Family Her family history includes Cancer in her mother; Diabetes in her father; Heart disease in her father and son; Hypertension in her father. Social  history  She reports that she has never smoked. She has never used smokeless tobacco. She reports that she drinks alcohol. She reports that she does not use drugs.  MEDICARE WELLNESS OBJECTIVES: Physical activity: Current Exercise Habits: Home exercise routine, Type of exercise: walking, Time (Minutes): 20, Frequency (Times/Week): 2, Weekly Exercise (Minutes/Week): 40, Intensity: Mild Cardiac risk factors: Cardiac Risk Factors include: advanced  age (>32men, >62 women);diabetes mellitus;dyslipidemia;hypertension;obesity (BMI >30kg/m2);sedentary lifestyle Depression/mood screen:   Depression screen [facility] 2/9 04/25/2017  Decreased Interest 0  Down, Depressed, Hopeless 0  PHQ - 2 Score 0    ADLs:  In your present state of health, do you have any difficulty performing the following activities: 04/25/2017 07/04/2016  Hearing? N N  Vision? N N  Difficulty concentrating or making decisions? N N  Walking or climbing stairs? N N  Dressing or bathing? N N  Doing errands, shopping? N N  Some recent data might be hidden     Cognitive Testing  Alert? Yes  Normal Appearance?Yes  Oriented to person? Yes  Place? Yes   Time? Yes  Recall of three objects?  Yes  Can perform simple calculations? Yes  Displays appropriate judgment?Yes  Can read the correct time from a watch face?Yes  EOL planning: Does Patient Have a Medical Advance Directive?: No Would patient like information on creating a medical advance directive?: Yes (MAU/Ambulatory/Procedural Areas - Information given)   Review of Systems  Constitutional: Negative.   HENT: Negative.   Eyes: Negative.   Respiratory: Negative.   Cardiovascular: Negative.   Gastrointestinal: Negative.   Genitourinary: Negative.   Musculoskeletal: Negative.   Skin: Negative.   Neurological: Positive for tingling (bilateral toes).  Endo/Heme/Allergies: Negative.   Psychiatric/Behavioral: Negative.      Objective:   Today's Vitals   04/25/17 1058  BP: 126/74  Pulse: 68  Resp: 14  Temp: (!) 97.5 F (36.4 C)  SpO2: 97%  Weight: 156 lb (70.8 kg)  Height: 5' 0.5" (1.537 m)  PainSc: 3   PainLoc: Shoulder   Body mass index is 29.97 kg/m.  General appearance: alert, no distress, WD/WN,  female HEENT: normocephalic, sclerae anicteric, TMs pearly, nares patent, no discharge or erythema, pharynx normal Oral cavity: MMM, no lesions Neck: supple, no lymphadenopathy, no thyromegaly, no  masses Heart: RRR, normal S1, S2, no murmurs Lungs: CTA bilaterally, no wheezes, rhonchi, or rales Abdomen: +bs, soft, non tender, non distended, no masses, no hepatomegaly, no splenomegaly Musculoskeletal: nontender, no swelling, no obvious deformity Extremities: no edema, no cyanosis, no clubbing Pulses: 2+ symmetric, upper and lower extremities, normal cap refill Neurological: alert, oriented x 3, CN2-12 intact, strength normal upper extremities and lower extremities, sensation normal throughout, DTRs 2+ throughout, no cerebellar signs, gait normal Psychiatric: normal affect, behavior normal, pleasant  Breast: defer Gyn: defer Rectal: defer   Medicare Attestation I have personally reviewed: The patient's medical and social history Their use of alcohol, tobacco or illicit drugs Their current medications and supplements The patient's functional ability including ADLs,fall risks, home safety risks, cognitive, and hearing and visual impairment Diet and physical activities Evidence for depression or mood disorders  The patient's weight, height, BMI, and visual acuity have been recorded in the chart.  I have made referrals, counseling, and provided education to the patient based on review of the above and I have provided the patient with a written personalized care plan for preventive services.     Vicie Mutters, PA-C   04/25/2017

## 2017-04-25 ENCOUNTER — Other Ambulatory Visit: Payer: Self-pay | Admitting: Physician Assistant

## 2017-04-25 ENCOUNTER — Encounter: Payer: Self-pay | Admitting: Physician Assistant

## 2017-04-25 ENCOUNTER — Ambulatory Visit (INDEPENDENT_AMBULATORY_CARE_PROVIDER_SITE_OTHER): Payer: Medicare HMO | Admitting: Physician Assistant

## 2017-04-25 VITALS — BP 126/74 | HR 68 | Temp 97.5°F | Resp 14 | Ht 60.5 in | Wt 156.0 lb

## 2017-04-25 DIAGNOSIS — E782 Mixed hyperlipidemia: Secondary | ICD-10-CM | POA: Diagnosis not present

## 2017-04-25 DIAGNOSIS — R6889 Other general symptoms and signs: Secondary | ICD-10-CM

## 2017-04-25 DIAGNOSIS — Z79899 Other long term (current) drug therapy: Secondary | ICD-10-CM

## 2017-04-25 DIAGNOSIS — I10 Essential (primary) hypertension: Secondary | ICD-10-CM | POA: Diagnosis not present

## 2017-04-25 DIAGNOSIS — E559 Vitamin D deficiency, unspecified: Secondary | ICD-10-CM | POA: Diagnosis not present

## 2017-04-25 DIAGNOSIS — E1140 Type 2 diabetes mellitus with diabetic neuropathy, unspecified: Secondary | ICD-10-CM

## 2017-04-25 DIAGNOSIS — Z1231 Encounter for screening mammogram for malignant neoplasm of breast: Secondary | ICD-10-CM

## 2017-04-25 DIAGNOSIS — Z7189 Other specified counseling: Secondary | ICD-10-CM | POA: Diagnosis not present

## 2017-04-25 DIAGNOSIS — E2839 Other primary ovarian failure: Secondary | ICD-10-CM

## 2017-04-25 DIAGNOSIS — Z471 Aftercare following joint replacement surgery: Secondary | ICD-10-CM

## 2017-04-25 DIAGNOSIS — K760 Fatty (change of) liver, not elsewhere classified: Secondary | ICD-10-CM

## 2017-04-25 DIAGNOSIS — Z96611 Presence of right artificial shoulder joint: Secondary | ICD-10-CM

## 2017-04-25 DIAGNOSIS — H8109 Meniere's disease, unspecified ear: Secondary | ICD-10-CM

## 2017-04-25 DIAGNOSIS — Z0001 Encounter for general adult medical examination with abnormal findings: Secondary | ICD-10-CM

## 2017-04-25 DIAGNOSIS — Z0000 Encounter for general adult medical examination without abnormal findings: Secondary | ICD-10-CM

## 2017-04-25 LAB — CBC WITH DIFFERENTIAL/PLATELET
BASOS ABS: 0 {cells}/uL (ref 0–200)
Basophils Relative: 0 %
EOS ABS: 484 {cells}/uL (ref 15–500)
Eosinophils Relative: 4 %
HCT: 39.8 % (ref 35.0–45.0)
Hemoglobin: 13.2 g/dL (ref 11.7–15.5)
LYMPHS PCT: 35 %
Lymphs Abs: 4235 cells/uL — ABNORMAL HIGH (ref 850–3900)
MCH: 26.6 pg — AB (ref 27.0–33.0)
MCHC: 33.2 g/dL (ref 32.0–36.0)
MCV: 80.2 fL (ref 80.0–100.0)
MONOS PCT: 7 %
MPV: 9.9 fL (ref 7.5–12.5)
Monocytes Absolute: 847 cells/uL (ref 200–950)
Neutro Abs: 6534 cells/uL (ref 1500–7800)
Neutrophils Relative %: 54 %
PLATELETS: 306 10*3/uL (ref 140–400)
RBC: 4.96 MIL/uL (ref 3.80–5.10)
RDW: 15.1 % — ABNORMAL HIGH (ref 11.0–15.0)
WBC: 12.1 10*3/uL — ABNORMAL HIGH (ref 3.8–10.8)

## 2017-04-25 LAB — BASIC METABOLIC PANEL WITH GFR
BUN: 12 mg/dL (ref 7–25)
CHLORIDE: 97 mmol/L — AB (ref 98–110)
CO2: 28 mmol/L (ref 20–32)
CREATININE: 0.57 mg/dL (ref 0.50–0.99)
Calcium: 10.7 mg/dL — ABNORMAL HIGH (ref 8.6–10.4)
GFR, Est African American: >89 mL/min (ref >=60)
GFR, Est Non African American: >89 mL/min (ref >=60)
Glucose, Bld: 115 mg/dL — ABNORMAL HIGH (ref 65–99)
Potassium: 3.6 mmol/L (ref 3.5–5.3)
Sodium: 138 mmol/L (ref 135–146)

## 2017-04-25 LAB — HEPATIC FUNCTION PANEL
ALBUMIN: 4.6 g/dL (ref 3.6–5.1)
ALT: 23 U/L (ref 6–29)
AST: 21 U/L (ref 10–35)
Alkaline Phosphatase: 96 U/L (ref 33–130)
BILIRUBIN TOTAL: 0.4 mg/dL (ref 0.2–1.2)
Bilirubin, Direct: 0.1 mg/dL (ref <=0.2)
Indirect Bilirubin: 0.3 mg/dL (ref 0.2–1.2)
TOTAL PROTEIN: 7.7 g/dL (ref 6.1–8.1)

## 2017-04-25 LAB — LIPID PANEL
Cholesterol: 168 mg/dL (ref <200)
HDL: 43 mg/dL — AB (ref >50)
LDL Cholesterol: 80 mg/dL (ref <100)
Total CHOL/HDL Ratio: 3.9 Ratio (ref <5.0)
Triglycerides: 227 mg/dL — ABNORMAL HIGH (ref <150)
VLDL: 45 mg/dL — ABNORMAL HIGH (ref <30)

## 2017-04-25 LAB — TSH: TSH: 2.89 m[IU]/L

## 2017-04-25 NOTE — Patient Instructions (Signed)
Morton Neuralgia Morton neuralgia is a type of foot pain in the area closest to your toes. This area is sometimes called the ball of your foot. Morton neuralgia occurs when a branch of a nerve in your foot (digital nerve) becomes compressed. When this happens over a long period of time, the nerve can thicken (neuroma) and cause pain. This usually occurs between the third and fourth toe. Morton neuralgia can come and go but may get worse over time. What are the causes? Your digital nerve can become compressed and stretched at a point where it passes under a thick band of tissue that connects your toes (intermetatarsal ligament). Morton neuralgia can be caused by mild repetitive damage in this area. This type of damage can result from:  Activities such as running or jumping.  Wearing shoes that are too tight.  What increases the risk? You may be at risk for Morton neuralgia if you:  Are female.  Wear high heels.  Wear shoes that are narrow or tight.  Participate in activities that stretch your toes. These include: ? Running. ? [place]. ? Long-distance walking.  What are the signs or symptoms? The first symptom of Morton neuralgia is pain that spreads from the ball of your foot to your toes. It may feel like you are walking on a marble. Pain usually gets worse with walking and goes away at night. Other symptoms may include numbness and cramping of your toes. How is this diagnosed? Your health care provider will do a physical exam. When doing the exam, your health care provider may:  Squeeze your foot just behind your toe.  Ask you to move your toes to check for pain.  You may also have tests on your foot to confirm the diagnosis. These may include:  An X-ray.  An MRI.  How is this treated? Treatment for Morton neuralgia may be as simple as changing the kind of shoes you wear. Other treatments may include:  Wearing a supportive pad (orthosis) under the front of your foot. This  lifts your toe bones and takes pressure off the nerve.  Getting injections of numbing medicine and anti-inflammatory medicine (steroid) in the nerve.  Having surgery to remove part of the thickened nerve.  Follow these instructions at home:  Take medicine only as directed by your health care provider.  Wear soft-soled shoes with a wide toe area.  Stop activities that may be causing pain.  Elevate your foot when resting.  Massage your foot.  Apply ice to the injured area: ? Put ice in a plastic bag. ? Place a towel between your skin and the bag. ? Leave the ice on for 20 minutes, 2-3 times a day.  Keep all follow-up visits as directed by your health care provider. This is important. Contact a health care provider if:  Home care instructions are not helping you get better.  Your symptoms change or get worse. This information is not intended to replace advice given to you by your health care provider. Make sure you discuss any questions you have with your health care provider. Document Released: 12/12/2000 Document Revised: 02/11/2016 Document Reviewed: 11/06/2013 Elsevier Interactive Patient Education  2018 [employer].  We want weight loss that will last so you should lose 1-2 pounds a week.  THAT IS IT! Please pick THREE things a month to change. Once it is a habit check off the item. Then pick another three items off the list to become habits.  If you are already  doing a habit on the list GREAT!  Cross that item off! o Don't drink your calories. Ie, alcohol, soda, fruit juice, and sweet tea.  o Drink more water. Drink a glass when you feel hungry or before each meal.  o Eat breakfast - Complex carb and protein (likeDannon light and fit yogurt, oatmeal, fruit, eggs, [place] bacon). o Measure your cereal.  Eat no more than one cup a day. (ie [place]) o Eat an apple a day. o Add a vegetable a day. o Try a new vegetable a month. o Use Pam! Stop using oil or butter to  cook. o Don't finish your plate or use smaller plates. o Share your dessert. o Eat sugar free Jello for dessert or frozen grapes. o Don't eat 2-3 hours before bed. o Switch to whole wheat bread, pasta, and brown rice. o Make healthier choices when you eat out. No fries! o Pick baked chicken, NOT fried. o Don't forget to SLOW DOWN when you eat. It is not going anywhere.  o Take the stairs. o Park far away in the parking lot o [employer] (or weights) for 10 minutes while watching TV. o Walk at work for 10 minutes during break. o Walk outside 1 time a week with your friend, kids, dog, or significant other. o Start a walking group at [place] the mall as much as you can tolerate.  o Keep a food diary. o Weigh yourself daily. o Walk for 15 minutes 3 days per week. o Cook at home more often and eat out less.  If life happens and you go back to old habits, it is okay.  Just start over. You can do it!   If you experience chest pain, get short of breath, or tired during the exercise, please stop immediately and inform your doctor.   We are starting you on Metformin to prevent or treat diabetes. Metformin does not cause low blood sugars. In order to create energy your cells need insulin and sugar but sometime your cells do not accept the insulin and this can cause increased sugars and decreased energy. The Metformin helps your cells accept insulin and the sugar to give you more energy.   The two most common side effects are nausea and diarrhea, follow these rules to avoid it! You can take imodium per box instructions when starting metformin if needed.   Rules of metformin: 1) start out slow with only one pill daily. Our goal for you is 4 pills a day or 2000mg  total.  2) take with your largest meal. 3) Take with least amount of carbs.   Call if you have any problems.

## 2017-04-26 LAB — MAGNESIUM: MAGNESIUM: 1.6 mg/dL (ref 1.5–2.5)

## 2017-04-26 LAB — HEMOGLOBIN A1C
HEMOGLOBIN A1C: 6.1 % — AB (ref <5.7)
MEAN PLASMA GLUCOSE: 128 mg/dL

## 2017-05-05 DIAGNOSIS — M25511 Pain in right shoulder: Secondary | ICD-10-CM | POA: Diagnosis not present

## 2017-05-10 DIAGNOSIS — M25511 Pain in right shoulder: Secondary | ICD-10-CM | POA: Diagnosis not present

## 2017-05-12 DIAGNOSIS — M25511 Pain in right shoulder: Secondary | ICD-10-CM | POA: Diagnosis not present

## 2017-05-16 DIAGNOSIS — M25511 Pain in right shoulder: Secondary | ICD-10-CM | POA: Diagnosis not present

## 2017-05-19 DIAGNOSIS — M25511 Pain in right shoulder: Secondary | ICD-10-CM | POA: Diagnosis not present

## 2017-05-23 DIAGNOSIS — M25511 Pain in right shoulder: Secondary | ICD-10-CM | POA: Diagnosis not present

## 2017-05-26 DIAGNOSIS — M25511 Pain in right shoulder: Secondary | ICD-10-CM | POA: Diagnosis not present

## 2017-06-20 ENCOUNTER — Encounter: Payer: Self-pay | Admitting: Adult Health

## 2017-06-20 ENCOUNTER — Ambulatory Visit (INDEPENDENT_AMBULATORY_CARE_PROVIDER_SITE_OTHER): Payer: Medicare HMO | Admitting: Adult Health

## 2017-06-20 DIAGNOSIS — M7989 Other specified soft tissue disorders: Secondary | ICD-10-CM | POA: Diagnosis not present

## 2017-06-20 MED ORDER — PREDNISONE 20 MG PO TABS: ORAL_TABLET | ORAL | 0 refills | Status: DC

## 2017-06-20 NOTE — Patient Instructions (Signed)
You may try an NSAID such as aleve/ibuprofen, and try a short course of prednisone to clear up the pain.   Varicose Veins Varicose veins are veins that have become enlarged and twisted. They are usually seen in the legs but can occur in other parts of the body as well. What are the causes? This condition is the result of valves in the veins not working properly. Valves in the veins help to return blood from the leg to the heart. If these valves are damaged, blood flows backward and backs up into the veins in the leg near the skin. This causes the veins to become larger. What increases the risk? People who are on their feet a lot, who are pregnant, or who are overweight are more likely to develop varicose veins. What are the signs or symptoms?  Bulging, twisted-appearing, bluish veins, most commonly found on the legs.  Leg pain or a feeling of heaviness. These symptoms may be worse at the end of the day.  Leg swelling.  Changes in skin color. How is this diagnosed? A health care provider can usually diagnose varicose veins by examining your legs. Your health care provider may also recommend an ultrasound of your leg veins. How is this treated? Most varicose veins can be treated at home.However, other treatments are available for people who have persistent symptoms or want to improve the cosmetic appearance of the varicose veins. These treatment options include:  Sclerotherapy. A solution is injected into the vein to close it off.  Laser treatment. A laser is used to heat the vein to close it off.  Radiofrequency vein ablation. An electrical current produced by radio waves is used to close off the vein.  Phlebectomy. The vein is surgically removed through small incisions made over the varicose vein.  Vein ligation and stripping. The vein is surgically removed through incisions made over the varicose vein after the vein has been tied (ligated).  Follow these instructions at home:  Do  not stand or sit in one position for long periods of time. Do not sit with your legs crossed. Rest with your legs raised during the day.  Wear compression stockings as directed by your health care provider. These stockings help to prevent blood clots and reduce swelling in your legs.  Do not wear other tight, encircling garments around your legs, pelvis, or waist.  Walk as much as possible to increase blood flow.  Raise the foot of your bed at night with 2-inch blocks.  If you get a cut in the skin over the vein and the vein bleeds, lie down with your leg raised and press on it with a clean cloth until the bleeding stops. Then place a bandage (dressing) on the cut. See your health care provider if it continues to bleed. Contact a health care provider if:  The skin around your ankle starts to break down.  You have pain, redness, tenderness, or hard swelling in your leg over a vein.  You are uncomfortable because of leg pain. This information is not intended to replace advice given to you by your health care provider. Make sure you discuss any questions you have with your health care provider. Document Released: 06/15/2005 Document Revised: 02/11/2016 Document Reviewed: 03/08/2016 Elsevier Interactive Patient Education  2017 [employer].

## 2017-06-20 NOTE — Progress Notes (Signed)
Assessment and Plan:  Lidya was seen today for acute visit.  Diagnoses and all orders for this visit:  Swelling of left lower extremity -     predniSONE (DELTASONE) 20 MG tablet; 2 tablets daily for 3 days, 1 tablet daily for 4 days.  She may try NSAIDS/ short course of prednisone. Had a discussion with patient- she would like to defer imaging at this time. She may call us back to schedule this should the pain become worse, or other new symptoms start.   Further disposition pending results of labs. Discussed med's effects and SE's.   Over 30 minutes of exam, counseling, chart review, and critical decision making was performed.   Future Appointments Date Time Provider [place]  08/01/2017 9:00 AM [name], MD [facility] None    ------------------------------------------------------------------------------------------------------------------   HPI 66 y.o.female presents for a intermittently painful soft growth in her left popliteal area that is ongoing for 2 months, worse in the past two weeks. Pain with pressure to the area such as sitting in a chair with her knees fully flexed. Pain is a dull ache, 5/10 when she is having it.  She has not tried any interventions such as ice/heat/medications. Denies other new symptoms.   She denies HA/weakness, numbness/tingling other than baseline neuropathy, other sensory changes, denies pain in calf with ambulation, edema of lower extremities, CP/SOB.   Never smoker, no history of clots, on ASA 81 mg daily, no high-risk medications.   Past Medical History:  Diagnosis Date  . Allergy   . Hyperlipidemia   . Hypertension   . Iron deficiency   . Meniere's disease   . Neuropathy   . Type II or unspecified type diabetes mellitus without mention of complication, not stated as uncontrolled   . Vitamin D deficiency      Allergies  Allergen Reactions  . Penicillins     Current Outpatient Prescriptions on File Prior to Visit   Medication Sig  . aspirin 81 MG chewable tablet Chew by mouth daily.  Marland Kitchen BAYER MICROLET LANCETS lancets Use as instructed  . bisoprolol-hydrochlorothiazide (ZIAC) 5-6.25 MG tablet Take 1 tablet by mouth daily.  . Blood Glucose Monitoring Suppl (CONTOUR BLOOD GLUCOSE SYSTEM) DEVI Test Blood sugar once daily  . cetirizine (ZYRTEC) 10 MG tablet Take 10 mg by mouth daily.  . Cholecalciferol (VITAMIN D PO) Take 6,000 Int'l Units by mouth daily.   . diazepam (VALIUM) 5 MG tablet Take 5 mg by mouth every 6 (six) hours as needed for anxiety.  . ferrous sulfate dried (SLOW FE) 160 (50 FE) MG TBCR SR tablet Take 160 mg by mouth daily.  Marland Kitchen glucose blood (BAYER CONTOUR TEST) test strip Use as instructed  . hydrochlorothiazide (HYDRODIURIL) 25 MG tablet TAKE 1 TABLET  DAILY FOR BLOOD PRESSURE AND FLUID  . Magnesium 500 MG CAPS Take 500 mg by mouth daily.  . meclizine (ANTIVERT) 25 MG tablet TAKE ONE TABLET BY MOUTH THREE TIMES DAILY AS NEEDED FOR DIZZINESS  . metFORMIN (GLUCOPHAGE-XR) 500 MG 24 hr tablet Take 2 tablets (1,000 mg total) by mouth 2 (two) times daily.  . montelukast (SINGULAIR) 10 MG tablet TAKE 1 TABLET EVERY DAY  FOR  ALLERGIES  . OVER THE COUNTER MEDICATION One Source supplement 2 times daily for hair,skin and nails  . OVER THE COUNTER MEDICATION Nasogel 2 squirts each nostril BID PRN.  Marland Kitchen oxyCODONE-acetaminophen (PERCOCET/ROXICET) 5-325 MG tablet Take by mouth every 4 (four) hours as needed for severe pain.  . phentermine (ADIPEX-P)  37.5 MG tablet TAKE ONE TABLET BY MOUTH IN THE MORNING  . promethazine (PHENERGAN) 25 MG tablet TAKE ONE TABLET BY MOUTH ONCE DAILY AS NEEDED FOR DIZZINESS  . simvastatin (ZOCOR) 20 MG tablet TAKE 1 TABLET AT BEDTIME FOR CHOLESTEROL   No current facility-administered medications on file prior to visit.     ROS: all negative except above.   Physical Exam:  BP 132/80   Pulse 64   Temp (!) 97.5 F (36.4 C)   Resp 14   Ht 5' 0.5" (1.537 m)   Wt 154 lb  (69.9 kg)   SpO2 96%   BMI 29.58 kg/m   General Appearance: Well nourished, in no apparent distress. Eyes: PERRLA, EOMs, conjunctiva no swelling or erythema Neck: Supple. Respiratory: Respiratory effort normal, BS equal bilaterally without rales, rhonchi, wheezing or stridor.  Cardio: RRR with no MRGs. Brisk peripheral pulses without edema.  Abdomen: Soft, + BS.  Non tender, no palpable masses.  Lymphatics: Non tender without lymphadenopathy.  Musculoskeletal: Full ROM, 5/5 strength, normal gait.  Skin: Warm, dry without rashes, lesions, ecchymosis.  Neuro: Cranial nerves intact. Normal muscle tone, no cerebellar symptoms. Sensation intact.  Psych: Awake and oriented X 3, normal affect, Insight and Judgment appropriate.  Vascular: superficial tortuous veins palpable in left popliteal space; non-tender in office to palpation, no distinct lumps/growths, no edema to area. Calves are symmetrical. Distal pulses equal and intact. Extremities warm to touch.   Izora Ribas, NP 9:17 AM [facility]

## 2017-07-04 ENCOUNTER — Other Ambulatory Visit: Payer: Self-pay | Admitting: Internal Medicine

## 2017-07-13 ENCOUNTER — Other Ambulatory Visit: Payer: Self-pay | Admitting: *Deleted

## 2017-07-13 MED ORDER — METFORMIN HCL ER 500 MG PO TB24: 1000.0000 mg | ORAL_TABLET | Freq: Two times a day (BID) | ORAL | 1 refills | Status: DC

## 2017-07-14 ENCOUNTER — Ambulatory Visit (INDEPENDENT_AMBULATORY_CARE_PROVIDER_SITE_OTHER): Payer: Medicare HMO

## 2017-07-14 ENCOUNTER — Other Ambulatory Visit: Payer: Self-pay | Admitting: *Deleted

## 2017-07-14 DIAGNOSIS — Z23 Encounter for immunization: Secondary | ICD-10-CM | POA: Diagnosis not present

## 2017-07-14 MED ORDER — METFORMIN HCL ER 500 MG PO TB24
1000.0000 mg | ORAL_TABLET | Freq: Two times a day (BID) | ORAL | 1 refills | Status: DC
Start: 2017-07-14 — End: 2018-07-30

## 2017-07-31 NOTE — Patient Instructions (Signed)

## 2017-07-31 NOTE — Progress Notes (Signed)
[facility] [name], M.D.     Uvaldo Bristle. [name], P.A.-C [name], [place] [address] [place], [place] [address] Telephone [phone] Telefax [phone] Annual Screening/Preventative Visit & Comprehensive Evaluation &  Examination     This very nice 66 y.o.  MWF presents for a Screening/Preventative Visit & comprehensive evaluation and management of multiple medical co-morbidities.  Patient has been followed for HTN, T2_NIDDM, Hyperlipidemia and Vitamin D Deficiency.      HTN predates since 2004. Patient's BP has been controlled at home and patient denies any cardiac symptoms as chest pain, palpitations, shortness of breath, dizziness or ankle swelling. Today's BP  is at goal - 138/82.      Patient's hyperlipidemia is near controlled with diet and medications. Patient denies myalgias or other medication SE's. Last lipids were at goal albeit sl elevated Trig's: Lab Results  Component Value Date   CHOL 174 08/01/2017   HDL 44 (L) 08/01/2017   LDLCALC 80 04/25/2017   TRIG 190 (H) 08/01/2017   CHOLHDL 4.0 08/01/2017      Patient has Morbid Obesity (BMI 30) and consequent T2_NIDDM circa  2004  and patient denies reactive hypoglycemic symptoms, visual blurring, diabetic polys or paresthesias.  Patient desires retrial w/Phentermine Last A1c was not at goal:  Lab Results  Component Value Date   HGBA1C 6.1 (H) 04/25/2017      Finally, patient has history of Vitamin D Deficiency ("37" / 2014) and last Vitamin D was at goal: Lab Results  Component Value Date   VD25OH 78 01/23/2017   Current Outpatient Medications on File Prior to Visit  Medication Sig  . aspirin 81 MG chewable tablet Chew by mouth daily.  Marland Kitchen BAYER MICROLET LANCETS lancets Use as instructed  . bisoprolol-hydrochlorothiazide (ZIAC) 5-6.25 MG tablet TAKE 1 TABLET DAILY  . Blood Glucose Monitoring Suppl (CONTOUR BLOOD GLUCOSE  SYSTEM) DEVI Test Blood sugar once daily  . cetirizine (ZYRTEC) 10 MG tablet Take 10 mg by mouth daily.  . Cholecalciferol (VITAMIN D PO) Take 6,000 Int'l Units by mouth daily.   . diazepam (VALIUM) 5 MG tablet Take 5 mg by mouth every 6 (six) hours as needed for anxiety.  . ferrous sulfate dried (SLOW FE) 160 (50 FE) MG TBCR SR tablet Take 160 mg by mouth daily.  Marland Kitchen glucose blood (BAYER CONTOUR TEST) test strip Use as instructed  . hydrochlorothiazide (HYDRODIURIL) 25 MG tablet TAKE 1 TABLET DAILY FOR    BLOOD PRESSURE AND FLUID  . Magnesium 500 MG CAPS Take 500 mg by mouth daily.  . metFORMIN (GLUCOPHAGE-XR) 500 MG 24 hr tablet Take 2 tablets (1,000 mg total) by mouth 2 (two) times daily.  . montelukast (SINGULAIR) 10 MG tablet TAKE 1 TABLET EVERY DAY FORALLERGIES  . OVER THE COUNTER MEDICATION One Source supplement 2 times daily for hair,skin and nails  . OVER THE COUNTER MEDICATION Nasogel 2 squirts each nostril BID PRN.  . simvastatin (ZOCOR) 20 MG tablet TAKE 1 TABLET AT BEDTIME   FOR CHOLESTEROL  . oxyCODONE-acetaminophen (PERCOCET/ROXICET) 5-325 MG tablet Take by mouth every 4 (four) hours as needed for severe pain.  . predniSONE (DELTASONE) 20 MG tablet 2 tablets daily for 3 days, 1 tablet daily for 4 days. (Patient not taking: Reported on 08/01/2017)   No current facility-administered medications on file prior to visit.    Allergies  Allergen Reactions  . Penicillins    Past Medical History:  Diagnosis Date  .  Allergy   . Hyperlipidemia   . Hypertension   . Iron deficiency   . Meniere's disease   . Neuropathy   . Type II or unspecified type diabetes mellitus without mention of complication, not stated as uncontrolled   . Vitamin D deficiency    Health Maintenance  Topic Date Due  . DEXA SCAN  08/03/2014  . HEMOGLOBIN A1C  10/26/2017  . MAMMOGRAM  02/18/2018  . OPHTHALMOLOGY EXAM  03/29/2018  . FOOT EXAM  07/31/2018  . COLONOSCOPY  03/28/2019  . TETANUS/TDAP   04/05/2021  . INFLUENZA VACCINE  Completed  . Hepatitis C Screening  Completed  . PNA vac Low Risk Adult  Completed   Immunization History  Administered Date(s) Administered  . Influenza Split 07/16/2013, 07/24/2014  . Influenza, High Dose Seasonal PF 06/15/2015, 07/04/2016, 07/14/2017  . PPD Test 04/14/2014  . Pneumococcal Conjugate-13 10/31/2014  . Pneumococcal Polysaccharide-23 12/01/2004, 03/23/2016  . Tdap 04/06/2011  . Zoster 07/16/2013   Past Surgical History:  Procedure Laterality Date  . ABDOMINAL HYSTERECTOMY  1998   BSO  . ORIF ANKLE FRACTURE Left 2000  . SHOULDER SURGERY Right 02/2017   rotator cuff repair Dr. Augustin Coupe   Family History  Problem Relation Age of Onset  . Cancer Mother        PANCREAS  . Hypertension Father   . Heart disease Father   . Diabetes Father   . Heart disease Son    Social History   Tobacco Use  . Smoking status: Never Smoker  . Smokeless tobacco: Never Used  Substance Use Topics  . Alcohol use: Yes    Comment: rarely  . Drug use: No    ROS Constitutional: Denies fever, chills, weight loss/gain, headaches, insomnia,  night sweats, and change in appetite. Does c/o fatigue. Eyes: Denies redness, blurred vision, diplopia, discharge, itchy, watery eyes.  ENT: Denies discharge, congestion, post nasal drip, epistaxis, sore throat, earache, hearing loss, dental pain, Tinnitus, Vertigo, Sinus pain, snoring.  Cardio: Denies chest pain, palpitations, irregular heartbeat, syncope, dyspnea, diaphoresis, orthopnea, PND, claudication, edema Respiratory: denies cough, dyspnea, DOE, pleurisy, hoarseness, laryngitis, wheezing.  Gastrointestinal: Denies dysphagia, heartburn, reflux, water brash, pain, cramps, nausea, vomiting, bloating, diarrhea, constipation, hematemesis, melena, hematochezia, jaundice, hemorrhoids Genitourinary: Denies dysuria, frequency, urgency, nocturia, hesitancy, discharge, hematuria, flank pain Breast: Breast lumps, nipple  discharge, bleeding.  Musculoskeletal: Denies arthralgia, myalgia, stiffness, Jt. Swelling, pain, limp, and strain/sprain. Denies falls. Skin: Denies puritis, rash, hives, warts, acne, eczema, changing in skin lesion Neuro: No weakness, tremor, incoordination, spasms, paresthesia, pain Psychiatric: Denies confusion, memory loss, sensory loss. Denies Depression. Endocrine: Denies change in weight, skin, hair change, nocturia, and paresthesia, diabetic polys, visual blurring, hyper / hypo glycemic episodes.  Heme/Lymph: No excessive bleeding, bruising, enlarged lymph nodes.  Physical Exam  BP 138/82   Pulse 90   Temp (!) 97.3 F (36.3 C)   Resp 15   Ht 5' (1.524 m)   Wt 153 lb 6.4 oz (69.6 kg)   SpO2 99%   BMI 29.96 kg/m   General Appearance: Well nourished, well groomed and in no apparent distress.  Eyes: PERRLA, EOMs, conjunctiva no swelling or erythema, normal fundi and vessels. Sinuses: No frontal/maxillary tenderness ENT/Mouth: EACs patent / TMs  nl. Nares clear without erythema, swelling, mucoid exudates. Oral hygiene is good. No erythema, swelling, or exudate. Tongue normal, non-obstructing. Tonsils not swollen or erythematous. Hearing normal.  Neck: Supple, thyroid normal. No bruits, nodes or JVD. Respiratory: Respiratory effort normal.  BS equal  and clear bilateral without rales, rhonci, wheezing or stridor. Cardio: Heart sounds are normal with regular rate and rhythm and no murmurs, rubs or gallops. Peripheral pulses are normal and equal bilaterally without edema. No aortic or femoral bruits. Chest: symmetric with normal excursions and percussion. Breasts: Symmetric, without lumps, nipple discharge, retractions, or fibrocystic changes.  Abdomen: Flat, soft with bowel sounds active. Nontender, no guarding, rebound, hernias, masses, or organomegaly.  Lymphatics: Non tender without lymphadenopathy.  Genitourinary:  Musculoskeletal: Full ROM all peripheral extremities, joint  stability, 5/5 strength, and normal gait. Skin: Warm and dry without rashes, lesions, cyanosis, clubbing or  ecchymosis.  Neuro: Cranial nerves intact, reflexes equal bilaterally. Normal muscle tone, no cerebellar symptoms. Sensation intact.  Pysch: Alert and oriented X 3, normal affect, Insight and Judgment appropriate.   Assessment and Plan  1. Annual Preventative Screening Examination  2. Essential hypertension  - EKG 12-Lead - Urinalysis, Routine w reflex microscopic - Microalbumin / creatinine urine ratio - CBC with Differential/Platelet - BASIC METABOLIC PANEL WITH GFR - Magnesium - TSH  3. Hyperlipidemia, mixed  - EKG 12-Lead - Hepatic function panel - Lipid panel - TSH  4. Type 2 diabetes mellitus with sensory neuropathy (HCC)  - EKG 12-Lead - Urinalysis, Routine w reflex microscopic - Microalbumin / creatinine urine ratio - HM DIABETES FOOT EXAM - LOW EXTREMITY NEUR EXAM DOCUM - Hemoglobin A1c - Insulin, random  5. Vitamin D deficiency  - VITAMIN D 25 Hydroxy  6. Screening for rectal cancer  - POC Hemoccult Bld/Stl   7. Screening for ischemic heart disease  - EKG 12-Lead  8. Morbid obesity   9. Medication management  - Urinalysis, Routine w reflex microscopic - Microalbumin / creatinine urine ratio - CBC with Differential/Platelet - BASIC METABOLIC PANEL WITH GFR - Hepatic function panel - Magnesium - Lipid panel - TSH - Hemoglobin A1c - Insulin, random - VITAMIN D 25 Hydroxy  10. Morbid obesity due to excess calories (HCC)  - phentermine (ADIPEX-P) 37.5 MG tablet; Take 1/2 to 1 tablet every morning for Dieting & Weight Loss  Dispense: 30 tablet; Refill: 5  11. Vertigo  - meclizine (ANTIVERT) 25 MG tablet; Take 1 tablet 3 x / day as needed for Dizziness  Dispense: 90 tablet; Refill: 1 - promethazine (PHENERGAN) 25 MG tablet; Take 1 tablet every 4 hours if needed for nausea or dizziness  Dispense: 90 tablet; Refill:  1            Patient was counseled in prudent diet to achieve/maintain BMI less than 25 for weight control, BP monitoring, regular exercise and medications. Discussed med's effects and SE's. Screening labs and tests as requested with regular follow-up as recommended. Over 40 minutes of exam, counseling, chart review and high complex critical decision making was performed.

## 2017-08-01 ENCOUNTER — Encounter: Payer: Self-pay | Admitting: Internal Medicine

## 2017-08-01 ENCOUNTER — Ambulatory Visit (INDEPENDENT_AMBULATORY_CARE_PROVIDER_SITE_OTHER): Payer: Medicare HMO | Admitting: Internal Medicine

## 2017-08-01 VITALS — BP 138/82 | HR 90 | Temp 97.3°F | Resp 15 | Ht 60.0 in | Wt 153.4 lb

## 2017-08-01 DIAGNOSIS — E1140 Type 2 diabetes mellitus with diabetic neuropathy, unspecified: Secondary | ICD-10-CM

## 2017-08-01 DIAGNOSIS — Z136 Encounter for screening for cardiovascular disorders: Secondary | ICD-10-CM

## 2017-08-01 DIAGNOSIS — E782 Mixed hyperlipidemia: Secondary | ICD-10-CM | POA: Diagnosis not present

## 2017-08-01 DIAGNOSIS — I10 Essential (primary) hypertension: Secondary | ICD-10-CM

## 2017-08-01 DIAGNOSIS — R42 Dizziness and giddiness: Secondary | ICD-10-CM

## 2017-08-01 DIAGNOSIS — Z0001 Encounter for general adult medical examination with abnormal findings: Secondary | ICD-10-CM

## 2017-08-01 DIAGNOSIS — E559 Vitamin D deficiency, unspecified: Secondary | ICD-10-CM | POA: Diagnosis not present

## 2017-08-01 DIAGNOSIS — Z0000 Encounter for general adult medical examination without abnormal findings: Secondary | ICD-10-CM | POA: Diagnosis not present

## 2017-08-01 DIAGNOSIS — Z79899 Other long term (current) drug therapy: Secondary | ICD-10-CM

## 2017-08-01 DIAGNOSIS — Z1212 Encounter for screening for malignant neoplasm of rectum: Secondary | ICD-10-CM

## 2017-08-01 MED ORDER — LISINOPRIL 40 MG PO TABS: ORAL_TABLET | ORAL | 1 refills | Status: DC

## 2017-08-01 MED ORDER — MECLIZINE HCL 25 MG PO TABS: ORAL_TABLET | ORAL | 1 refills | Status: DC

## 2017-08-01 MED ORDER — PROMETHAZINE HCL 25 MG PO TABS: ORAL_TABLET | ORAL | 1 refills | Status: DC

## 2017-08-01 MED ORDER — PHENTERMINE HCL 37.5 MG PO TABS: ORAL_TABLET | ORAL | 5 refills | Status: DC

## 2017-08-02 LAB — HEPATIC FUNCTION PANEL
AG RATIO: 1.5 (calc) (ref 1.0–2.5)
ALBUMIN MSPROF: 4.4 g/dL (ref 3.6–5.1)
ALT: 26 U/L (ref 6–29)
AST: 22 U/L (ref 10–35)
Alkaline phosphatase (APISO): 85 U/L (ref 33–130)
BILIRUBIN DIRECT: 0.1 mg/dL (ref 0.0–0.2)
BILIRUBIN INDIRECT: 0.4 mg/dL (ref 0.2–1.2)
BILIRUBIN TOTAL: 0.5 mg/dL (ref 0.2–1.2)
GLOBULIN: 3 g/dL (ref 1.9–3.7)
Total Protein: 7.4 g/dL (ref 6.1–8.1)

## 2017-08-02 LAB — URINALYSIS, ROUTINE W REFLEX MICROSCOPIC
BILIRUBIN URINE: NEGATIVE
Bacteria, UA: NONE SEEN /HPF
GLUCOSE, UA: NEGATIVE
Hgb urine dipstick: NEGATIVE
Hyaline Cast: NONE SEEN /LPF
Ketones, ur: NEGATIVE
NITRITE: NEGATIVE
PROTEIN: NEGATIVE
RBC / HPF: NONE SEEN /HPF (ref 0–2)
Specific Gravity, Urine: 1.014 (ref 1.001–1.03)
pH: 7 (ref 5.0–8.0)

## 2017-08-02 LAB — INSULIN, RANDOM: INSULIN: 13.6 u[IU]/mL (ref 2.0–19.6)

## 2017-08-02 LAB — BASIC METABOLIC PANEL WITH GFR
BUN: 11 mg/dL (ref 7–25)
CALCIUM: 9.9 mg/dL (ref 8.6–10.4)
CHLORIDE: 98 mmol/L (ref 98–110)
CO2: 30 mmol/L (ref 20–32)
Creat: 0.62 mg/dL (ref 0.50–0.99)
GFR, EST AFRICAN AMERICAN: 108 mL/min/{1.73_m2} (ref >=60)
GFR, EST NON AFRICAN AMERICAN: 93 mL/min/{1.73_m2} (ref >=60)
Glucose, Bld: 136 mg/dL — ABNORMAL HIGH (ref 65–99)
Potassium: 4 mmol/L (ref 3.5–5.3)
Sodium: 137 mmol/L (ref 135–146)

## 2017-08-02 LAB — CBC WITH DIFFERENTIAL/PLATELET
BASOS ABS: 71 {cells}/uL (ref 0–200)
Basophils Relative: 0.7 %
Eosinophils Absolute: 323 cells/uL (ref 15–500)
Eosinophils Relative: 3.2 %
HEMATOCRIT: 41 % (ref 35.0–45.0)
Hemoglobin: 13.6 g/dL (ref 11.7–15.5)
LYMPHS ABS: 3151 {cells}/uL (ref 850–3900)
MCH: 26.4 pg — ABNORMAL LOW (ref 27.0–33.0)
MCHC: 33.2 g/dL (ref 32.0–36.0)
MCV: 79.6 fL — AB (ref 80.0–100.0)
MPV: 10.4 fL (ref 7.5–12.5)
Monocytes Relative: 6.7 %
NEUTROS PCT: 58.2 %
Neutro Abs: 5878 cells/uL (ref 1500–7800)
PLATELETS: 289 10*3/uL (ref 140–400)
RBC: 5.15 10*6/uL — ABNORMAL HIGH (ref 3.80–5.10)
RDW: 14 % (ref 11.0–15.0)
TOTAL LYMPHOCYTE: 31.2 %
WBC: 10.1 10*3/uL (ref 3.8–10.8)
WBCMIX: 677 {cells}/uL (ref 200–950)

## 2017-08-02 LAB — HEMOGLOBIN A1C
EAG (MMOL/L): 7.7 (calc)
HEMOGLOBIN A1C: 6.5 %{Hb} — AB (ref <5.7)
MEAN PLASMA GLUCOSE: 140 (calc)

## 2017-08-02 LAB — LIPID PANEL
CHOL/HDL RATIO: 4 (calc) (ref <5.0)
Cholesterol: 174 mg/dL (ref <200)
HDL: 44 mg/dL — ABNORMAL LOW (ref >50)
LDL CHOLESTEROL (CALC): 99 mg/dL
NON-HDL CHOLESTEROL (CALC): 130 mg/dL — AB (ref <130)
TRIGLYCERIDES: 190 mg/dL — AB (ref <150)

## 2017-08-02 LAB — MAGNESIUM: MAGNESIUM: 1.7 mg/dL (ref 1.5–2.5)

## 2017-08-02 LAB — MICROALBUMIN / CREATININE URINE RATIO
Creatinine, Urine: 73 mg/dL (ref 20–275)
Microalb Creat Ratio: 5 mcg/mg creat (ref <30)
Microalb, Ur: 0.4 mg/dL

## 2017-08-02 LAB — VITAMIN D 25 HYDROXY (VIT D DEFICIENCY, FRACTURES): VIT D 25 HYDROXY: 64 ng/mL (ref 30–100)

## 2017-08-02 LAB — TSH: TSH: 2.97 m[IU]/L (ref 0.40–4.50)

## 2017-10-12 ENCOUNTER — Other Ambulatory Visit: Payer: Self-pay | Admitting: Internal Medicine

## 2017-10-12 ENCOUNTER — Other Ambulatory Visit: Payer: Self-pay | Admitting: *Deleted

## 2017-10-12 MED ORDER — BISOPROLOL-HYDROCHLOROTHIAZIDE 5-6.25 MG PO TABS: 1.0000 | ORAL_TABLET | Freq: Every day | ORAL | 1 refills | Status: DC

## 2017-10-23 NOTE — Progress Notes (Signed)
Assessment and Plan:  Kmya was seen today for cough.  Diagnoses and all orders for this visit:  Cough Benign exam; likely ACE inhibitor associated cough due to coinciding timeline; will attempt switch to ARB agent. Other interventions also discussed; patient to continue with allergy medication, try OTC PPI/H2 inhibitor in 2 weeks if symptoms not improved. Patient to call or message with progress. Follow up as scheduled.  -     benzonatate (TESSALON PERLES) 100 MG capsule; Take 1 capsule (100 mg total) by mouth 3 (three) times daily as needed for cough.  Essential hypertension -     olmesartan (BENICAR) 40 MG tablet; Take 1 tablet (40 mg total) by mouth daily.  Further disposition pending results of labs. Discussed med's effects and SE's.   Over 15 minutes of exam, counseling, chart review, and critical decision making was performed.   Future Appointments  Date Time Provider [place]  11/09/2017 10:30 AM [name], NP [facility] None  02/09/2018  9:30 AM [name], MD [facility] None  08/22/2018  9:00 AM [name], MD [facility] None    ------------------------------------------------------------------------------------------------------------------   HPI BP 130/80   Pulse 93   Temp (!) 97.5 F (36.4 C)   Wt 153 lb (69.4 kg)   SpO2 99%   BMI 29.88 kg/m   67 y.o.female presents for persistent cough that is ongoing since Thanksgiving or so. She reports typically nonproductive, dry cough. She reports sensation if irritation or "tickle" in her throat that exacerbates cough. Which appears to be worse at night. She has been taking promethazine DM cough syrup at night which has been helping. She also endorses frequent throat clearing.  She is treated by lisinopril 20 mg daily - she reports this was started about the same time as the cough.   She has been taking daily zyrtec; she does have hx of some mild acid reflux symptoms which have been well managed by  tums.   Past Medical History:  Diagnosis Date  . Allergy   . Hyperlipidemia   . Hypertension   . Iron deficiency   . Meniere's disease   . Neuropathy   . Type II or unspecified type diabetes mellitus without mention of complication, not stated as uncontrolled   . Vitamin D deficiency      Allergies  Allergen Reactions  . Lisinopril Cough  . Penicillins     Current Outpatient Medications on File Prior to Visit  Medication Sig  . aspirin 81 MG chewable tablet Chew by mouth daily.  Marland Kitchen BAYER MICROLET LANCETS lancets Use as instructed  . bisoprolol-hydrochlorothiazide (ZIAC) 5-6.25 MG tablet Take 1 tablet by mouth daily.  . Blood Glucose Monitoring Suppl (CONTOUR BLOOD GLUCOSE SYSTEM) DEVI Test Blood sugar once daily  . cetirizine (ZYRTEC) 10 MG tablet Take 10 mg by mouth daily.  . Cholecalciferol (VITAMIN D PO) Take 6,000 Int'l Units by mouth daily.   . diazepam (VALIUM) 5 MG tablet Take 5 mg by mouth every 6 (six) hours as needed for anxiety.  . ferrous sulfate dried (SLOW FE) 160 (50 FE) MG TBCR SR tablet Take 160 mg by mouth daily.  Marland Kitchen glucose blood (BAYER CONTOUR TEST) test strip Use as instructed  . hydrochlorothiazide (HYDRODIURIL) 25 MG tablet TAKE 1 TABLET DAILY FOR    BLOOD PRESSURE AND FLUID  . Magnesium 500 MG CAPS Take 500 mg by mouth daily.  . meclizine (ANTIVERT) 25 MG tablet Take 1 tablet 3 x / day as needed for Dizziness  . metFORMIN (  GLUCOPHAGE-XR) 500 MG 24 hr tablet Take 2 tablets (1,000 mg total) by mouth 2 (two) times daily.  . montelukast (SINGULAIR) 10 MG tablet TAKE 1 TABLET EVERY DAY FORALLERGIES  . OVER THE COUNTER MEDICATION One Source supplement 2 times daily for hair,skin and nails  . OVER THE COUNTER MEDICATION Nasogel 2 squirts each nostril BID PRN.  Marland Kitchen phentermine (ADIPEX-P) 37.5 MG tablet Take 1/2 to 1 tablet every morning for Dieting & Weight Loss  . predniSONE (DELTASONE) 20 MG tablet 2 tablets daily for 3 days, 1 tablet daily for 4 days.  .  promethazine (PHENERGAN) 25 MG tablet Take 1 tablet every 4 hours if needed for nausea or dizziness  . simvastatin (ZOCOR) 20 MG tablet TAKE 1 TABLET AT BEDTIME   FOR CHOLESTEROL  . oxyCODONE-acetaminophen (PERCOCET/ROXICET) 5-325 MG tablet Take by mouth every 4 (four) hours as needed for severe pain.   No current facility-administered medications on file prior to visit.     ROS: all negative except above.   Physical Exam:  BP 130/80   Pulse 93   Temp (!) 97.5 F (36.4 C)   Wt 153 lb (69.4 kg)   SpO2 99%   BMI 29.88 kg/m   General Appearance: Well nourished, in no apparent distress. Eyes: PERRLA, EOMs, conjunctiva no swelling or erythema Sinuses: No Frontal/maxillary tenderness ENT/Mouth: Ext aud canals clear, TMs without erythema, bulging. No erythema, swelling, or exudate on post pharynx.  Tonsils not swollen or erythematous. Hearing normal.  Neck: Supple, thyroid normal.  Respiratory: Respiratory effort normal, BS equal bilaterally without rales, rhonchi, wheezing or stridor.  Cardio: RRR with no MRGs. Brisk peripheral pulses without edema.  Abdomen: Soft, + BS.  Non tender, no guarding, rebound, hernias, masses. Lymphatics: Non tender without lymphadenopathy.  Musculoskeletal: normal gait.  Skin: Warm, dry without rashes, lesions, ecchymosis.  Psych: Awake and oriented X 3, normal affect, Insight and Judgment appropriate.     Izora Ribas, NP 10:29 AM [facility]

## 2017-10-24 ENCOUNTER — Encounter: Payer: Self-pay | Admitting: Adult Health

## 2017-10-24 ENCOUNTER — Ambulatory Visit (INDEPENDENT_AMBULATORY_CARE_PROVIDER_SITE_OTHER): Payer: Medicare HMO | Admitting: Adult Health

## 2017-10-24 VITALS — BP 130/80 | HR 93 | Temp 97.5°F | Wt 153.0 lb

## 2017-10-24 DIAGNOSIS — R05 Cough: Secondary | ICD-10-CM | POA: Diagnosis not present

## 2017-10-24 DIAGNOSIS — I10 Essential (primary) hypertension: Secondary | ICD-10-CM | POA: Diagnosis not present

## 2017-10-24 DIAGNOSIS — R059 Cough, unspecified: Secondary | ICD-10-CM

## 2017-10-24 MED ORDER — OLMESARTAN MEDOXOMIL 40 MG PO TABS: ORAL_TABLET | ORAL | 1 refills | Status: DC

## 2017-10-24 MED ORDER — OLMESARTAN MEDOXOMIL 40 MG PO TABS: 40.0000 mg | ORAL_TABLET | Freq: Every day | ORAL | 11 refills | Status: DC

## 2017-10-24 MED ORDER — BENZONATATE 100 MG PO CAPS: 100.0000 mg | ORAL_CAPSULE | Freq: Three times a day (TID) | ORAL | 0 refills | Status: DC | PRN

## 2017-10-24 NOTE — Patient Instructions (Signed)
Common causes of cough OR hoarseness OR sore throat:   Allergies, Viral Infections, Acid Reflux and Bacterial Infections.   Allergies and viral infections cause a cough OR sore throat by post nasal drip and are often worse at night, can also have sneezing, lower grade fevers, clear/yellow mucus. This is best treated with allergy medications or nasal sprays.  Please get on allegra for 1-2 weeks The strongest is allegra or fexafinadine  Cheapest at [employer], [employer], [employer]   Bacterial infections are more severe than allergies or viral infections with fever, teeth pain, fatigue. This can be treated with prednisone and the same over the counter medication and after 7 days can be treated with an antibiotic.   Silent reflux/GERD can cause a cough OR sore throat OR hoarseness WITHOUT heart burn because the esophagus that goes to the stomach and trachea that goes to the lungs are very close and when you lay down the acid can irritate your throat and lungs. This can cause hoarseness, cough, and wheezing. Please stop any alcohol or anti-inflammatories like aleve/advil/ibuprofen and start an over the counter Prilosec or omeprazole 1-2 times daily 30mins before food for 2 weeks, then switch to over the counter zantac/ratinidine or pepcid/famotadine once at night for 2 weeks.    sometimes irritation causes more irritation. Try voice rest, use sugar free cough drops to prevent coughing, and try to stop clearing your throat.   If you ever have a cough that does not go away after trying these things please make a follow up visit for further evaluation or we can refer you to a specialist. Or if you ever have shortness of breath or chest pain go to the ER.    

## 2017-11-08 NOTE — Progress Notes (Signed)
FOLLOW UP  Assessment and Plan:   Hypertension Well controlled with current medications  Monitor blood pressure at home; patient to call if consistently greater than 130/80 Continue DASH diet.   Reminder to go to the ER if any CP, SOB, nausea, dizziness, severe HA, changes vision/speech, left arm numbness and tingling and jaw pain.  Cholesterol Currently above goal; discussed diet and triglycerides Continue low cholesterol diet and exercise.  Check lipid panel.   Diabetes with diabetic neuropathy, unspecified Continue medication: metformin Continue diet and exercise.  Perform daily foot/skin check, notify office of any concerning changes.  Check A1C  Obesity with co morbidities Long discussion about weight loss, diet, and exercise Recommended diet heavy in fruits and veggies and low in animal meats, cheeses, and dairy products, appropriate calorie intake Discussed ideal weight for height and initial weight goal (145 lb) Patient will work on increasing fruits and vegetables, increase fluid intake, add walking as weather permits Patient on phentermine with benefit and no SE, taking drug breaks; continue close follow up.  Will follow up in 3 months  Vitamin D Def Near goal at last visit; continue supplementation to maintain goal of 70-100 Defer Vit D level  High risk medication usage Patient is on opioids as well as benzos, due to new guidelines we discussed decreasing benzo use. Went into great detail and length that new studies show that unintential overdose was most likely to occur with concurrent benzo use, that opioids are better acute and short term pain management and long term recent studies show that patients on long term opioid use have worse outcomes and more complications than other medications.   Continue diet and meds as discussed. Further disposition pending results of labs. Discussed med's effects and SE's.   Over 30 minutes of exam, counseling, chart review, and  critical decision making was performed.   Future Appointments  Date Time Provider [place]  02/09/2018  9:30 AM [name], MD [facility] None  08/22/2018  9:00 AM [name], MD [facility] None    ----------------------------------------------------------------------------------------------------------------------  HPI 66 y.o. female  presents for 3 month follow up on hypertension, cholesterol, diabetes, weight and vitamin D deficiency. She reports cough is significantly improved after switching from ACE to ARB at last visit.   On review of medications, note concurrent use of several high-risk medications: percocet, valium, phentermine - these were discussed at length today.  Patient is on opioids as well as benzos, due to new guidelines we discussed decreasing benzo use. Went into great detail and length that new studies show that unintential overdose was most likely to occur with concurrent benzo use, that opioids are better acute and short term pain management and long term recent studies show that patients on long term opioid use have worse outcomes and more complications than other medications.   she is prescribed phentermine for weight loss.  While on the medication they have lost 0 lbs since last visit. They deny palpitations, anxiety, trouble sleeping, elevated BP. She reports she takes 1/2 tab of phentermine daily, and takes 3-4 week drug break.   BMI is Body mass index is 29.88 kg/m., she is working on diet and exercise. Wt Readings from Last 3 Encounters:  11/09/17 153 lb (69.4 kg)  10/24/17 153 lb (69.4 kg)  08/01/17 153 lb 6.4 oz (69.6 kg)   Typical breakfast: 1 poptart or cereal (honey nut cheerios, shredded wheat, special K with 2% milk, uses small bowl 1/2 full - estimates 1 cup serving).  Typical  lunch: Salad or sandwhich, meat rollups, or might skip Typical dinner: Uses salad plate, small portion of meat (side of palm or smaller), and vegetables,  occasionally will have potatoes or rice, but just a tablespoon or two Snack: carrots, apples without dip Exercise: Tries to walk but limited by weather, "stays busy" during the day with housework and errands Water intake: Has cut out sodas, drinks water, coffee and tea - no sugar added - drinks 2-3 16.9 oz of water daily  Her blood pressure has been controlled at home, today their BP is BP: 130/78  She does not workout. She denies chest pain, shortness of breath, dizziness.   She is on cholesterol medication (pravastatin 20 mg daily) and denies myalgias. Her cholesterol is not at goal. The cholesterol last visit was:   Lab Results  Component Value Date   CHOL 174 08/01/2017   HDL 44 (L) 08/01/2017   LDLCALC 80 04/25/2017   TRIG 190 (H) 08/01/2017   CHOLHDL 4.0 08/01/2017    She has been working on diet and exercise for T2 diabetes treated by metformin, and denies increased appetite, nausea, paresthesia of the feet, polydipsia, polyuria, visual disturbances, vomiting and weight loss. Last A1C in the office was:  Lab Results  Component Value Date   HGBA1C 6.5 (H) 08/01/2017   Patient is on Vitamin D supplement and near goal of 70 at last check:    Lab Results  Component Value Date   VD25OH 64 08/01/2017        Current Medications:  Current Outpatient Medications on File Prior to Visit  Medication Sig  . aspirin 81 MG chewable tablet Chew by mouth daily.  Marland Kitchen BAYER MICROLET LANCETS lancets Use as instructed  . benzonatate (TESSALON PERLES) 100 MG capsule Take 1 capsule (100 mg total) by mouth 3 (three) times daily as needed for cough.  . bisoprolol-hydrochlorothiazide (ZIAC) 5-6.25 MG tablet Take 1 tablet by mouth daily.  . Blood Glucose Monitoring Suppl (CONTOUR BLOOD GLUCOSE SYSTEM) DEVI Test Blood sugar once daily  . cetirizine (ZYRTEC) 10 MG tablet Take 10 mg by mouth daily.  . Cholecalciferol (VITAMIN D PO) Take 6,000 Int'l Units by mouth daily.   . diazepam (VALIUM) 5 MG  tablet Take 5 mg by mouth every 6 (six) hours as needed for anxiety.  . ferrous sulfate dried (SLOW FE) 160 (50 FE) MG TBCR SR tablet Take 160 mg by mouth daily.  Marland Kitchen glucose blood (BAYER CONTOUR TEST) test strip Use as instructed  . hydrochlorothiazide (HYDRODIURIL) 25 MG tablet TAKE 1 TABLET DAILY FOR    BLOOD PRESSURE AND FLUID  . Magnesium 500 MG CAPS Take 500 mg by mouth daily.  . meclizine (ANTIVERT) 25 MG tablet Take 1 tablet 3 x / day as needed for Dizziness  . metFORMIN (GLUCOPHAGE-XR) 500 MG 24 hr tablet Take 2 tablets (1,000 mg total) by mouth 2 (two) times daily.  . montelukast (SINGULAIR) 10 MG tablet TAKE 1 TABLET EVERY DAY FORALLERGIES  . olmesartan (BENICAR) 40 MG tablet Take 1/2- 1 tab for blood pressure.  Marland Kitchen OVER THE COUNTER MEDICATION One Source supplement 2 times daily for hair,skin and nails  . OVER THE COUNTER MEDICATION Nasogel 2 squirts each nostril BID PRN.  Marland Kitchen oxyCODONE-acetaminophen (PERCOCET/ROXICET) 5-325 MG tablet Take by mouth every 4 (four) hours as needed for severe pain.  . phentermine (ADIPEX-P) 37.5 MG tablet Take 1/2 to 1 tablet every morning for Dieting & Weight Loss  . predniSONE (DELTASONE) 20 MG tablet 2  tablets daily for 3 days, 1 tablet daily for 4 days.  . promethazine (PHENERGAN) 25 MG tablet Take 1 tablet every 4 hours if needed for nausea or dizziness  . simvastatin (ZOCOR) 20 MG tablet TAKE 1 TABLET AT BEDTIME   FOR CHOLESTEROL   No current facility-administered medications on file prior to visit.      Allergies:  Allergies  Allergen Reactions  . Lisinopril Cough  . Penicillins      Medical History:  Past Medical History:  Diagnosis Date  . Allergy   . Hyperlipidemia   . Hypertension   . Iron deficiency   . Meniere's disease   . Neuropathy   . Type II or unspecified type diabetes mellitus without mention of complication, not stated as uncontrolled   . Vitamin D deficiency    Family history- Reviewed and unchanged Social history-  Reviewed and unchanged   Review of Systems:  Review of Systems  Constitutional: Negative for malaise/fatigue and weight loss.  HENT: Negative for hearing loss and tinnitus.   Eyes: Negative for blurred vision and double vision.  Respiratory: Negative for cough, shortness of breath and wheezing.   Cardiovascular: Negative for chest pain, palpitations, orthopnea, claudication and leg swelling.  Gastrointestinal: Negative for abdominal pain, blood in stool, constipation, diarrhea, heartburn, melena, nausea and vomiting.  Genitourinary: Negative.   Musculoskeletal: Negative for joint pain and myalgias.  Skin: Negative for rash.  Neurological: Negative for dizziness, tingling, sensory change, weakness and headaches.  Endo/Heme/Allergies: Negative for polydipsia.  Psychiatric/Behavioral: Negative.   All other systems reviewed and are negative.     Physical Exam: BP 130/78   Pulse 97   Temp 97.9 F (36.6 C)   Ht 5' (1.524 m)   Wt 153 lb (69.4 kg)   SpO2 97%   BMI 29.88 kg/m  Wt Readings from Last 3 Encounters:  11/09/17 153 lb (69.4 kg)  10/24/17 153 lb (69.4 kg)  08/01/17 153 lb 6.4 oz (69.6 kg)   General Appearance: Well nourished, in no apparent distress. Eyes: PERRLA, EOMs, conjunctiva no swelling or erythema Sinuses: No Frontal/maxillary tenderness ENT/Mouth: Ext aud canals clear, TMs without erythema, bulging. No erythema, swelling, or exudate on post pharynx.  Tonsils not swollen or erythematous. Hearing normal.  Neck: Supple, thyroid normal.  Respiratory: Respiratory effort normal, BS equal bilaterally without rales, rhonchi, wheezing or stridor.  Cardio: RRR with no MRGs. Brisk peripheral pulses without edema.  Abdomen: Soft, + BS.  Non tender, no guarding, rebound, hernias, masses. Lymphatics: Non tender without lymphadenopathy.  Musculoskeletal: Full ROM, 5/5 strength, Normal gait Skin: Warm, dry without rashes, lesions, ecchymosis.  Neuro: Cranial nerves intact.  No cerebellar symptoms.  Psych: Awake and oriented X 3, normal affect, Insight and Judgment appropriate.    Regina Ribas, NP 10:42 AM [facility]

## 2017-11-09 ENCOUNTER — Ambulatory Visit (INDEPENDENT_AMBULATORY_CARE_PROVIDER_SITE_OTHER): Payer: Medicare HMO | Admitting: Adult Health

## 2017-11-09 ENCOUNTER — Encounter: Payer: Self-pay | Admitting: Adult Health

## 2017-11-09 VITALS — BP 130/78 | HR 97 | Temp 97.9°F | Ht 60.0 in | Wt 153.0 lb

## 2017-11-09 DIAGNOSIS — E66811 Obesity, class 1: Secondary | ICD-10-CM

## 2017-11-09 DIAGNOSIS — Z79899 Other long term (current) drug therapy: Secondary | ICD-10-CM

## 2017-11-09 DIAGNOSIS — E559 Vitamin D deficiency, unspecified: Secondary | ICD-10-CM | POA: Diagnosis not present

## 2017-11-09 DIAGNOSIS — I10 Essential (primary) hypertension: Secondary | ICD-10-CM | POA: Diagnosis not present

## 2017-11-09 DIAGNOSIS — E782 Mixed hyperlipidemia: Secondary | ICD-10-CM | POA: Diagnosis not present

## 2017-11-09 DIAGNOSIS — E1140 Type 2 diabetes mellitus with diabetic neuropathy, unspecified: Secondary | ICD-10-CM | POA: Diagnosis not present

## 2017-11-09 DIAGNOSIS — E669 Obesity, unspecified: Secondary | ICD-10-CM | POA: Diagnosis not present

## 2017-11-09 NOTE — Patient Instructions (Signed)
Recommend switching from pop tarts and cereal to whole grain toast and avocado, toast with nut butter and fruit, plain bran flakes with fruit and nuts, etc  Recommmend [employer]'s killer bread - I like the thin sliced version  Aim for 7+ servings of fruits and vegetables daily  80+ fluid ounces of water or unsweet tea for healthy kidneys  Limit alcohol intake   Limit animal fats in diet for cholesterol and heart health - choose grass fed whenever available  Aim for low stress - take time to unwind and care for your mental health  Aim for 150 min of moderate intensity exercise weekly for heart health, and weights twice weekly for bone health  Aim for 7-9 hours of sleep daily    Diabetes Mellitus and Nutrition When you have diabetes (diabetes mellitus), it is very important to have healthy eating habits because your blood sugar (glucose) levels are greatly affected by what you eat and drink. Eating healthy foods in the appropriate amounts, at about the same times every day, can help you:  Control your blood glucose.  Lower your risk of heart disease.  Improve your blood pressure.  Reach or maintain a healthy weight.  Every person with diabetes is different, and each person has different needs for a meal plan. Your health care provider may recommend that you work with a diet and nutrition specialist (dietitian) to make a meal plan that is best for you. Your meal plan may vary depending on factors such as:  The calories you need.  The medicines you take.  Your weight.  Your blood glucose, blood pressure, and cholesterol levels.  Your activity level.  Other health conditions you have, such as heart or kidney disease.  How do carbohydrates affect me? Carbohydrates affect your blood glucose level more than any other type of food. Eating carbohydrates naturally increases the amount of glucose in your blood. Carbohydrate counting is a method for keeping track of how many carbohydrates  you eat. Counting carbohydrates is important to keep your blood glucose at a healthy level, especially if you use insulin or take certain oral diabetes medicines. It is important to know how many carbohydrates you can safely have in each meal. This is different for every person. Your dietitian can help you calculate how many carbohydrates you should have at each meal and for snack. Foods that contain carbohydrates include:  Bread, cereal, rice, pasta, and crackers.  Potatoes and corn.  Peas, beans, and lentils.  Milk and yogurt.  Fruit and juice.  Desserts, such as cakes, cookies, ice cream, and candy.  How does alcohol affect me? Alcohol can cause a sudden decrease in blood glucose (hypoglycemia), especially if you use insulin or take certain oral diabetes medicines. Hypoglycemia can be a life-threatening condition. Symptoms of hypoglycemia (sleepiness, dizziness, and confusion) are similar to symptoms of having too much alcohol. If your health care provider says that alcohol is safe for you, follow these guidelines:  Limit alcohol intake to no more than 1 drink per day for nonpregnant women and 2 drinks per day for men. One drink equals 12 oz of beer, 5 oz of wine, or 1 oz of hard liquor.  Do not drink on an empty stomach.  Keep yourself hydrated with water, diet soda, or unsweetened iced tea.  Keep in mind that regular soda, juice, and other mixers may contain a lot of sugar and must be counted as carbohydrates.  What are tips for following this plan? Reading food labels  Start by checking the serving size on the label. The amount of calories, carbohydrates, fats, and other nutrients listed on the label are based on one serving of the food. Many foods contain more than one serving per package.  Check the total grams (g) of carbohydrates in one serving. You can calculate the number of servings of carbohydrates in one serving by dividing the total carbohydrates by 15. For example,  if a food has 30 g of total carbohydrates, it would be equal to 2 servings of carbohydrates.  Check the number of grams (g) of saturated and trans fats in one serving. Choose foods that have low or no amount of these fats.  Check the number of milligrams (mg) of sodium in one serving. Most people should limit total sodium intake to less than 2,300 mg per day.  Always check the nutrition information of foods labeled as "low-fat" or "nonfat". These foods may be higher in added sugar or refined carbohydrates and should be avoided.  Talk to your dietitian to identify your daily goals for nutrients listed on the label. Shopping  Avoid buying canned, premade, or processed foods. These foods tend to be high in fat, sodium, and added sugar.  Shop around the outside edge of the grocery store. This includes fresh fruits and vegetables, bulk grains, fresh meats, and fresh dairy. Cooking  Use low-heat cooking methods, such as baking, instead of high-heat cooking methods like deep frying.  Cook using healthy oils, such as olive, canola, or sunflower oil.  Avoid cooking with butter, cream, or high-fat meats. Meal planning  Eat meals and snacks regularly, preferably at the same times every day. Avoid going long periods of time without eating.  Eat foods high in fiber, such as fresh fruits, vegetables, beans, and whole grains. Talk to your dietitian about how many servings of carbohydrates you can eat at each meal.  Eat 4-6 ounces of lean protein each day, such as lean meat, chicken, fish, eggs, or tofu. 1 ounce is equal to 1 ounce of meat, chicken, or fish, 1 egg, or 1/4 cup of tofu.  Eat some foods each day that contain healthy fats, such as avocado, nuts, seeds, and fish. Lifestyle   Check your blood glucose regularly.  Exercise at least 30 minutes 5 or more days each week, or as told by your health care provider.  Take medicines as told by your health care provider.  Do not use any  products that contain nicotine or tobacco, such as cigarettes and e-cigarettes. If you need help quitting, ask your health care provider.  Work with a Social worker or diabetes educator to identify strategies to manage stress and any emotional and social challenges. What are some questions to ask my health care provider?  Do I need to meet with a diabetes educator?  Do I need to meet with a dietitian?  What number can I call if I have questions?  When are the best times to check my blood glucose? Where to find more information:  American Diabetes Association: diabetes.org/food-and-fitness/food  Academy of Nutrition and Dietetics: PokerClues.dk  [employer] of Diabetes and Digestive and Kidney Diseases (NIH): ContactWire.be Summary  A healthy meal plan will help you control your blood glucose and maintain a healthy lifestyle.  Working with a diet and nutrition specialist (dietitian) can help you make a meal plan that is best for you.  Keep in mind that carbohydrates and alcohol have immediate effects on your blood glucose levels. It is important to count  carbohydrates and to use alcohol carefully. This information is not intended to replace advice given to you by your health care provider. Make sure you discuss any questions you have with your health care provider. Document Released: 06/02/2005 Document Revised: 10/10/2016 Document Reviewed: 10/10/2016 Elsevier Interactive Patient Education  [employer].

## 2017-11-10 LAB — CBC WITH DIFFERENTIAL/PLATELET
Basophils Absolute: 60 cells/uL (ref 0–200)
Basophils Relative: 0.6 %
EOS ABS: 240 {cells}/uL (ref 15–500)
Eosinophils Relative: 2.4 %
HCT: 37.3 % (ref 35.0–45.0)
Hemoglobin: 12.6 g/dL (ref 11.7–15.5)
Lymphs Abs: 3060 cells/uL (ref 850–3900)
MCH: 26.8 pg — AB (ref 27.0–33.0)
MCHC: 33.8 g/dL (ref 32.0–36.0)
MCV: 79.4 fL — AB (ref 80.0–100.0)
MPV: 10.4 fL (ref 7.5–12.5)
Monocytes Relative: 5.5 %
NEUTROS PCT: 60.9 %
Neutro Abs: 6090 cells/uL (ref 1500–7800)
PLATELETS: 268 10*3/uL (ref 140–400)
RBC: 4.7 10*6/uL (ref 3.80–5.10)
RDW: 13.4 % (ref 11.0–15.0)
TOTAL LYMPHOCYTE: 30.6 %
WBC: 10 10*3/uL (ref 3.8–10.8)
WBCMIX: 550 {cells}/uL (ref 200–950)

## 2017-11-10 LAB — HEPATIC FUNCTION PANEL
AG Ratio: 1.4 (calc) (ref 1.0–2.5)
ALT: 21 U/L (ref 6–29)
AST: 21 U/L (ref 10–35)
Albumin: 4.3 g/dL (ref 3.6–5.1)
Alkaline phosphatase (APISO): 88 U/L (ref 33–130)
BILIRUBIN DIRECT: 0.1 mg/dL (ref 0.0–0.2)
BILIRUBIN INDIRECT: 0.4 mg/dL (ref 0.2–1.2)
Globulin: 3 g/dL (calc) (ref 1.9–3.7)
Total Bilirubin: 0.5 mg/dL (ref 0.2–1.2)
Total Protein: 7.3 g/dL (ref 6.1–8.1)

## 2017-11-10 LAB — BASIC METABOLIC PANEL WITH GFR
BUN: 8 mg/dL (ref 7–25)
CHLORIDE: 98 mmol/L (ref 98–110)
CO2: 29 mmol/L (ref 20–32)
Calcium: 10 mg/dL (ref 8.6–10.4)
Creat: 0.54 mg/dL (ref 0.50–0.99)
GFR, EST AFRICAN AMERICAN: 112 mL/min/{1.73_m2} (ref >=60)
GFR, Est Non African American: 97 mL/min/{1.73_m2} (ref >=60)
Glucose, Bld: 140 mg/dL — ABNORMAL HIGH (ref 65–99)
POTASSIUM: 4.2 mmol/L (ref 3.5–5.3)
Sodium: 136 mmol/L (ref 135–146)

## 2017-11-10 LAB — LIPID PANEL
CHOL/HDL RATIO: 3.3 (calc) (ref <5.0)
CHOLESTEROL: 164 mg/dL (ref <200)
HDL: 50 mg/dL — ABNORMAL LOW (ref >50)
LDL Cholesterol (Calc): 91 mg/dL (calc)
Non-HDL Cholesterol (Calc): 114 mg/dL (calc) (ref <130)
Triglycerides: 124 mg/dL (ref <150)

## 2017-11-10 LAB — HEMOGLOBIN A1C
HEMOGLOBIN A1C: 6.6 %{Hb} — AB (ref <5.7)
MEAN PLASMA GLUCOSE: 143 (calc)
eAG (mmol/L): 7.9 (calc)

## 2017-11-10 LAB — TSH: TSH: 2.37 mIU/L (ref 0.40–4.50)

## 2017-12-31 ENCOUNTER — Other Ambulatory Visit: Payer: Self-pay | Admitting: Internal Medicine

## 2018-01-03 DIAGNOSIS — R69 Illness, unspecified: Secondary | ICD-10-CM | POA: Diagnosis not present

## 2018-02-09 ENCOUNTER — Ambulatory Visit (INDEPENDENT_AMBULATORY_CARE_PROVIDER_SITE_OTHER): Payer: Medicare HMO | Admitting: Internal Medicine

## 2018-02-09 VITALS — BP 136/80 | HR 84 | Temp 97.0°F | Resp 16 | Ht 60.0 in | Wt 154.0 lb

## 2018-02-09 DIAGNOSIS — Z79899 Other long term (current) drug therapy: Secondary | ICD-10-CM | POA: Diagnosis not present

## 2018-02-09 DIAGNOSIS — I10 Essential (primary) hypertension: Secondary | ICD-10-CM

## 2018-02-09 DIAGNOSIS — R079 Chest pain, unspecified: Secondary | ICD-10-CM

## 2018-02-09 DIAGNOSIS — E782 Mixed hyperlipidemia: Secondary | ICD-10-CM | POA: Diagnosis not present

## 2018-02-09 DIAGNOSIS — E559 Vitamin D deficiency, unspecified: Secondary | ICD-10-CM | POA: Diagnosis not present

## 2018-02-09 DIAGNOSIS — E1140 Type 2 diabetes mellitus with diabetic neuropathy, unspecified: Secondary | ICD-10-CM | POA: Diagnosis not present

## 2018-02-09 NOTE — Progress Notes (Signed)
This very nice 67 y.o. MWF presents for 6 month follow up with HTN, HLD, T2_DM  and Vitamin D Deficiency.      Patient is treated for HTN (2004) & BP has been controlled at home. Today's BP is at goal - 136/80. Patient has had no complaints of any exertional cardiac type chest pain, palpitations, dyspnea / orthopnea / PND, dizziness, claudication, or dependent edema. Patient does relate hx/o transient sharp stabbing pains lasting only seconds and loca=ted in the upper Left chest wall .      Hyperlipidemia is controlled with diet & meds. Patient denies myalgias or other med SE's. Last Lipids were at goal: Lab Results  Component Value Date   CHOL 164 11/09/2017   HDL 50 (L) 11/09/2017   LDLCALC 91 11/09/2017   TRIG 124 11/09/2017   CHOLHDL 3.3 11/09/2017      Also, the patient has history of Obesity (BMI 30+) and T2_NIDDM (2004) and has had no symptoms of reactive hypoglycemia, diabetic polys or visual blurring. She has had occasional burning or tingling paresthesias of her feet (soles).  She reports CBG's usually range betw 110-130 mg%.  Last A1c was not at goal: Lab Results  Component Value Date   HGBA1C 6.6 (H) 11/09/2017      Further, the patient also has history of Vitamin D Deficiency ("37"/2014) and supplements vitamin D without any suspected side-effects. Last vitamin D was at goal:  Lab Results  Component Value Date   VD25OH 64 08/01/2017   Current Outpatient Medications on File Prior to Visit  Medication Sig  . aspirin 81 MG chewable tablet Chew by mouth daily.  Marland Kitchen BAYER MICROLET LANCETS lancets Use as instructed  . bisoprolol-hydrochlorothiazide (ZIAC) 5-6.25 MG tablet TAKE 1 TABLET DAILY  . Blood Glucose Monitoring Suppl (CONTOUR BLOOD GLUCOSE SYSTEM) DEVI Test Blood sugar once daily  . cetirizine (ZYRTEC) 10 MG tablet Take 10 mg by mouth daily.  . Cholecalciferol (VITAMIN D PO) Take 6,000 Int'l Units by mouth daily.   . ferrous sulfate dried (SLOW FE) 160 (50 FE) MG  TBCR SR tablet Take 160 mg by mouth daily.  Marland Kitchen glucose blood (BAYER CONTOUR TEST) test strip Use as instructed  . hydrochlorothiazide (HYDRODIURIL) 25 MG tablet TAKE 1 TABLET DAILY FOR    BLOOD PRESSURE AND FLUID  . Magnesium 500 MG CAPS Take 500 mg by mouth daily.  . meclizine (ANTIVERT) 25 MG tablet Take 1 tablet 3 x / day as needed for Dizziness  . metFORMIN (GLUCOPHAGE-XR) 500 MG 24 hr tablet Take 2 tablets (1,000 mg total) by mouth 2 (two) times daily.  . montelukast (SINGULAIR) 10 MG tablet TAKE 1 TABLET DAILY FOR    ALLERGIES  . olmesartan (BENICAR) 40 MG tablet Take 1/2- 1 tab for blood pressure.  Marland Kitchen OVER THE COUNTER MEDICATION One Source supplement 2 times daily for hair,skin and nails  . OVER THE COUNTER MEDICATION Nasogel 2 squirts each nostril BID PRN.  Marland Kitchen oxyCODONE-acetaminophen (PERCOCET/ROXICET) 5-325 MG tablet Take by mouth every 4 (four) hours as needed for severe pain.  . phentermine (ADIPEX-P) 37.5 MG tablet Take 1/2 to 1 tablet every morning for Dieting & Weight Loss  . promethazine (PHENERGAN) 25 MG tablet Take 1 tablet every 4 hours if needed for nausea or dizziness  . simvastatin (ZOCOR) 20 MG tablet TAKE 1 TABLET AT BEDTIME   FOR CHOLESTEROL   No current facility-administered medications on file prior to visit.    Allergies  Allergen  Reactions  . Lisinopril Cough  . Penicillins    PMHx:   Past Medical History:  Diagnosis Date  . Allergy   . Hyperlipidemia   . Hypertension   . Iron deficiency   . Meniere's disease   . Neuropathy   . Type II or unspecified type diabetes mellitus without mention of complication, not stated as uncontrolled   . Vitamin D deficiency    Immunization History  Administered Date(s) Administered  . Influenza Split 07/16/2013, 07/24/2014  . Influenza, High Dose Seasonal PF 06/15/2015, 07/04/2016, 07/14/2017  . PPD Test 04/14/2014  . Pneumococcal Conjugate-13 10/31/2014  . Pneumococcal Polysaccharide-23 12/01/2004, 03/23/2016  . Tdap  04/06/2011  . Zoster 07/16/2013   Past Surgical History:  Procedure Laterality Date  . ABDOMINAL HYSTERECTOMY  1998   BSO  . ORIF ANKLE FRACTURE Left 2000  . SHOULDER SURGERY Right 02/2017   rotator cuff repair Dr. Augustin Coupe   FHx:    Reviewed / unchanged  SHx:    Reviewed / unchanged  Systems Review:  Constitutional: Denies fever, chills, wt changes, headaches, insomnia, fatigue, night sweats, change in appetite. Eyes: Denies redness, blurred vision, diplopia, discharge, itchy, watery eyes.  ENT: Denies discharge, congestion, post nasal drip, epistaxis, sore throat, earache, hearing loss, dental pain, tinnitus, vertigo, sinus pain, snoring.  CV: Denies chest pain, palpitations, irregular heartbeat, syncope, dyspnea, diaphoresis, orthopnea, PND, claudication or edema. Respiratory: denies cough, dyspnea, DOE, pleurisy, hoarseness, laryngitis, wheezing.  Gastrointestinal: Denies dysphagia, odynophagia, heartburn, reflux, water brash, abdominal pain or cramps, nausea, vomiting, bloating, diarrhea, constipation, hematemesis, melena, hematochezia  or hemorrhoids. Genitourinary: Denies dysuria, frequency, urgency, nocturia, hesitancy, discharge, hematuria or flank pain. Musculoskeletal: Denies arthralgias, myalgias, stiffness, jt. swelling, pain, limping or strain/sprain.  Skin: Denies pruritus, rash, hives, warts, acne, eczema or change in skin lesion(s). Neuro: No weakness, tremor, incoordination, spasms, paresthesia or pain. Psychiatric: Denies confusion, memory loss or sensory loss. Endo: Denies change in weight, skin or hair change.  Heme/Lymph: No excessive bleeding, bruising or enlarged lymph nodes.  Physical Exam  BP 136/80   Pulse 84   Temp (!) 97 F (36.1 C)   Resp 16   Ht 5' (1.524 m)   Wt 154 lb (69.9 kg)   BMI 30.08 kg/m   Appears  well nourished, well groomed  and in no distress.  Eyes: PERRLA, EOMs, conjunctiva no swelling or erythema. Sinuses: No frontal/maxillary  tenderness ENT/Mouth: EAC's clear, TM's nl w/o erythema, bulging. Nares clear w/o erythema, swelling, exudates. Oropharynx clear without erythema or exudates. Oral hygiene is good. Tongue normal, non obstructing. Hearing intact.  Neck: Supple. Thyroid not palpable. Car 2+/2+ without bruits, nodes or JVD. Chest: Respirations nl with BS clear & equal w/o rales, rhonchi, wheezing or stridor.  Cor: Heart sounds normal w/ regular rate and rhythm without sig. murmurs, gallops, clicks or rubs. Peripheral pulses normal and equal  without edema.  Abdomen: Soft & bowel sounds normal. Non-tender w/o guarding, rebound, hernias, masses or organomegaly.  Lymphatics: Unremarkable.  Musculoskeletal: Full ROM all peripheral extremities, joint stability, 5/5 strength and normal gait.  Skin: Warm, dry without exposed rashes, lesions or ecchymosis apparent.  Neuro: Cranial nerves intact, reflexes equal bilaterally. Sensory-motor testing grossly intact. Tendon reflexes grossly intact.  Pysch: Alert & oriented x 3.  Insight and judgement nl & appropriate. No ideations.  Assessment and Plan:  - Continue medication, monitor blood pressure at home.  - Continue DASH diet.  Reminder to go to the ER if any CP,  SOB, nausea,  dizziness, severe HA, changes vision/speech.  - Continue diet/meds, exercise,& lifestyle modifications.  - Continue monitor periodic cholesterol/liver & renal functions   1. Essential hypertension  - CBC with Differential/Platelet - COMPLETE METABOLIC PANEL WITH GFR - Magnesium - TSH - EKG 12-Lead  2. Hyperlipidemia, mixed  - Lipid panel  3. Type 2 diabetes mellitus with sensory neuropathy (HCC)  - Continue diet, exercise, lifestyle modifications.  - Monitor appropriate labs.  - Hemoglobin A1c - Insulin, random  4. Vitamin D deficiency  - Continue supplementation.   - VITAMIN D 25 Hydroxyl  5. Chest pain, unspecified type  - EKG 12-Lead  6. Medication management  - CBC  with Differential/Platelet - COMPLETE METABOLIC PANEL WITH GFR - Magnesium - Lipid panel - TSH - Hemoglobin A1c - Insulin, random - VITAMIN D 25 Hydroxy          Discussed  regular exercise, BP monitoring, weight control to achieve/maintain BMI less than 25 and discussed med and SE's. Recommended labs to assess and monitor clinical status with further disposition pending results of labs. Over 30 minutes of exam, counseling, chart review was performed.

## 2018-02-09 NOTE — Patient Instructions (Signed)

## 2018-02-11 ENCOUNTER — Encounter: Payer: Self-pay | Admitting: Internal Medicine

## 2018-02-15 LAB — CBC WITH DIFFERENTIAL/PLATELET
BASOS PCT: 0.6 %
Basophils Absolute: 65 cells/uL (ref 0–200)
EOS ABS: 238 {cells}/uL (ref 15–500)
EOS PCT: 2.2 %
HCT: 36.7 % (ref 35.0–45.0)
Hemoglobin: 12.4 g/dL (ref 11.7–15.5)
Lymphs Abs: 3424 cells/uL (ref 850–3900)
MCH: 27.2 pg (ref 27.0–33.0)
MCHC: 33.8 g/dL (ref 32.0–36.0)
MCV: 80.5 fL (ref 80.0–100.0)
MONOS PCT: 6.4 %
MPV: 10.7 fL (ref 7.5–12.5)
Neutro Abs: 6383 cells/uL (ref 1500–7800)
Neutrophils Relative %: 59.1 %
PLATELETS: 285 10*3/uL (ref 140–400)
RBC: 4.56 10*6/uL (ref 3.80–5.10)
RDW: 13.8 % (ref 11.0–15.0)
TOTAL LYMPHOCYTE: 31.7 %
WBC mixed population: 691 cells/uL (ref 200–950)
WBC: 10.8 10*3/uL (ref 3.8–10.8)

## 2018-02-15 LAB — COMPLETE METABOLIC PANEL WITH GFR
AG Ratio: 1.7 (calc) (ref 1.0–2.5)
ALKALINE PHOSPHATASE (APISO): 90 U/L (ref 33–130)
ALT: 25 U/L (ref 6–29)
AST: 26 U/L (ref 10–35)
Albumin: 4.6 g/dL (ref 3.6–5.1)
BILIRUBIN TOTAL: 0.5 mg/dL (ref 0.2–1.2)
BUN: 12 mg/dL (ref 7–25)
CHLORIDE: 97 mmol/L — AB (ref 98–110)
CO2: 28 mmol/L (ref 20–32)
Calcium: 10.1 mg/dL (ref 8.6–10.4)
Creat: 0.7 mg/dL (ref 0.50–0.99)
GFR, EST AFRICAN AMERICAN: 103 mL/min/{1.73_m2} (ref >=60)
GFR, Est Non African American: 89 mL/min/{1.73_m2} (ref >=60)
GLUCOSE: 142 mg/dL — AB (ref 65–99)
Globulin: 2.7 g/dL (calc) (ref 1.9–3.7)
Potassium: 3.9 mmol/L (ref 3.5–5.3)
Sodium: 136 mmol/L (ref 135–146)
TOTAL PROTEIN: 7.3 g/dL (ref 6.1–8.1)

## 2018-02-15 LAB — LIPID PANEL
Cholesterol: 159 mg/dL (ref <200)
HDL: 47 mg/dL — AB (ref >50)
LDL Cholesterol (Calc): 92 mg/dL (calc)
Non-HDL Cholesterol (Calc): 112 mg/dL (calc) (ref <130)
TRIGLYCERIDES: 106 mg/dL (ref <150)
Total CHOL/HDL Ratio: 3.4 (calc) (ref <5.0)

## 2018-02-15 LAB — HEMOGLOBIN A1C
EAG (MMOL/L): 7.9 (calc)
Hgb A1c MFr Bld: 6.6 % of total Hgb — ABNORMAL HIGH (ref <5.7)
Mean Plasma Glucose: 143 (calc)

## 2018-02-15 LAB — MAGNESIUM: Magnesium: 1.6 mg/dL (ref 1.5–2.5)

## 2018-02-15 LAB — TSH: TSH: 3.42 mIU/L (ref 0.40–4.50)

## 2018-02-15 LAB — INSULIN, RANDOM: INSULIN: 23.2 u[IU]/mL — AB (ref 2.0–19.6)

## 2018-02-15 LAB — VITAMIN D 25 HYDROXY (VIT D DEFICIENCY, FRACTURES): Vit D, 25-Hydroxy: 82 ng/mL (ref 30–100)

## 2018-02-21 ENCOUNTER — Other Ambulatory Visit: Payer: Self-pay | Admitting: Internal Medicine

## 2018-03-06 DIAGNOSIS — M79671 Pain in right foot: Secondary | ICD-10-CM | POA: Diagnosis not present

## 2018-03-06 DIAGNOSIS — S92413A Displaced fracture of proximal phalanx of unspecified great toe, initial encounter for closed fracture: Secondary | ICD-10-CM

## 2018-03-06 DIAGNOSIS — S92514A Nondisplaced fracture of proximal phalanx of right lesser toe(s), initial encounter for closed fracture: Secondary | ICD-10-CM | POA: Diagnosis not present

## 2018-03-06 HISTORY — DX: Displaced fracture of proximal phalanx of unspecified great toe, initial encounter for closed fracture: S92.413A

## 2018-04-03 DIAGNOSIS — S92514A Nondisplaced fracture of proximal phalanx of right lesser toe(s), initial encounter for closed fracture: Secondary | ICD-10-CM | POA: Diagnosis not present

## 2018-04-03 DIAGNOSIS — M79671 Pain in right foot: Secondary | ICD-10-CM | POA: Diagnosis not present

## 2018-04-16 DIAGNOSIS — H5203 Hypermetropia, bilateral: Secondary | ICD-10-CM | POA: Diagnosis not present

## 2018-04-16 LAB — HM DIABETES EYE EXAM

## 2018-04-17 ENCOUNTER — Other Ambulatory Visit: Payer: Self-pay | Admitting: Internal Medicine

## 2018-04-17 DIAGNOSIS — Z1231 Encounter for screening mammogram for malignant neoplasm of breast: Secondary | ICD-10-CM

## 2018-05-22 NOTE — Progress Notes (Signed)
MEDICARE ANNUAL WELLNESS VISIT AND FOLLOW UP  Assessment:    Medicare annual wellness visit  Essential hypertension - continue medications, DASH diet, exercise and monitor at home. Call if greater than 130/80.  - CBC with Differential/Platelet - CMP/GFR - TSH  Type 2 diabetes mellitus with sensory neuropathy ([place]) Discussed general issues about diabetes pathophysiology and management., Educational material distributed., Suggested low cholesterol diet., Encouraged aerobic exercise., Discussed foot care., Reminded to get yearly retinal exam.- report requested - Hemoglobin A1c  Obesity with co morbidities - long discussion about weight loss, diet, and exercise - Lipid panel - Hemoglobin A1c  Hyperlipidemia -continue medications, check lipids, decrease fatty foods, increase activity.  - Lipid panel  Vitamin D deficiency Continue supplement   Medication management - Magnesium  Meniere's disease, unspecified laterality Continue HCTZ, monitor kidney function Follow up Dr. Lucia Gaskins  Estrogen deficiency - DG Bone Density; Future   Fatty liver Weight loss advised, avoid alcohol/tylenol, will monitor LFTs   Over 30 minutes of exam, counseling, chart review, and critical decision making was performed  Future Appointments  Date Time Provider [place]  08/22/2018  9:00 AM [name], MD [facility] None    Plan:   During the course of the visit the patient was educated and counseled about appropriate screening and preventive services including:    Pneumococcal vaccine   Influenza vaccine  Td vaccine  Prevnar 13  Screening electrocardiogram  Screening mammography  Bone densitometry screening  Colorectal cancer screening  Diabetes screening  Glaucoma screening  Nutrition counseling   Advanced directives: given info/requested copies   Subjective:   Regina Cervantes is a 67 y.o. female who presents for Medicare Annual Wellness Visit and 3 month  follow up on hypertension, diabetes with DM neuropathy, hyperlipidemia, vitamin D def. She had a AHI of 4.4 with sleep study in 2015. She has seen Dr. Lucia Gaskins for her menere's, she is back on her HCTZ.  she is prescribed phentermine for weight loss, has been off this past month  While on the medication they have lost 0 lbs since last visit. They deny palpitations, anxiety, trouble sleeping, elevated BP.   BMI is Body mass index is 30.27 kg/m., she is working on diet and exercise. Wt Readings from Last 3 Encounters:  05/23/18 155 lb (70.3 kg)  02/09/18 154 lb (69.9 kg)  11/09/17 153 lb (69.4 kg)   Her blood pressure has been controlled at home, today their BP is BP: 130/80  She does not workout regularly, walks 1-2 x a week for 15-20 mins. She denies chest pain, shortness of breath, dizziness.    She is on cholesterol medication and denies myalgias. Her cholesterol is at goal. The cholesterol last visit was:   Lab Results  Component Value Date   CHOL 159 02/09/2018   HDL 47 (L) 02/09/2018   LDLCALC 92 02/09/2018   TRIG 106 02/09/2018   CHOLHDL 3.4 02/09/2018    She was first diagnosed with DM 10-15 years ago, has been working on diet and exercise for diabetes with DM neuropathy, she has done a great job bringing down her A1C with weight loss/phentermine, she is on metformin, bASA, ACE, and denies polydipsia, polyuria and visual disturbances. She does check fasting sugars, ranging 99-125. Last A1C in the office was:  Lab Results  Component Value Date   HGBA1C 6.6 (H) 02/09/2018   Patient is on Vitamin D supplement.   Lab Results  Component Value Date   VD25OH 82 02/09/2018  Lab Results  Component Value Date   GFRNONAA 89 02/09/2018     Medication Review Current Outpatient Medications on File Prior to Visit  Medication Sig  . aspirin 81 MG chewable tablet Chew by mouth daily.  . bisoprolol-hydrochlorothiazide (ZIAC) 5-6.25 MG tablet TAKE 1 TABLET DAILY  . Blood Glucose  Monitoring Suppl (CONTOUR BLOOD GLUCOSE SYSTEM) DEVI Test Blood sugar once daily  . cetirizine (ZYRTEC) 10 MG tablet Take 10 mg by mouth daily.  . Cholecalciferol (VITAMIN D PO) Take 6,000 Int'l Units by mouth daily.   . ferrous sulfate dried (SLOW FE) 160 (50 FE) MG TBCR SR tablet Take 160 mg by mouth daily.  Marland Kitchen glucose blood (BAYER CONTOUR TEST) test strip Use as instructed  . hydrochlorothiazide (HYDRODIURIL) 25 MG tablet TAKE 1 TABLET DAILY FOR    BLOOD PRESSURE AND FLUID  . Magnesium 500 MG CAPS Take 500 mg by mouth daily.  . meclizine (ANTIVERT) 25 MG tablet Take 1 tablet 3 x / day as needed for Dizziness  . metFORMIN (GLUCOPHAGE-XR) 500 MG 24 hr tablet Take 2 tablets (1,000 mg total) by mouth 2 (two) times daily.  . montelukast (SINGULAIR) 10 MG tablet TAKE 1 TABLET DAILY FOR    ALLERGIES  . olmesartan (BENICAR) 40 MG tablet Take 1/2- 1 tab for blood pressure.  Marland Kitchen OVER THE COUNTER MEDICATION Nasogel 2 squirts each nostril BID PRN.  . promethazine (PHENERGAN) 25 MG tablet Take 1 tablet every 4 hours if needed for nausea or dizziness  . simvastatin (ZOCOR) 20 MG tablet TAKE 1 TABLET AT BEDTIME   FOR CHOLESTEROL  . BAYER MICROLET LANCETS lancets Use as instructed  . OVER THE COUNTER MEDICATION One Source supplement 2 times daily for hair,skin and nails  . oxyCODONE-acetaminophen (PERCOCET/ROXICET) 5-325 MG tablet Take by mouth every 4 (four) hours as needed for severe pain.  . phentermine (ADIPEX-P) 37.5 MG tablet TAKE 1/2 TO 1 (ONE-HALF TO ONE) TABLET BY MOUTH ONCE DAILY IN THE MORNING FOR DIETING AND WEIGHT LOSS (Patient not taking: Reported on 05/23/2018)   No current facility-administered medications on file prior to visit.     Allergies: Allergies  Allergen Reactions  . Lisinopril Cough  . Penicillins     Current Problems (verified) has Hyperlipidemia; Hypertension; Vitamin D deficiency; Meniere's disease; Medication management; Obesity (BMI 30.0-34.9); Encounter for Medicare  annual wellness exam; Type 2 diabetes mellitus with sensory neuropathy ([place]); and Fatty liver on their problem list.  Screening Tests Immunization History  Administered Date(s) Administered  . Influenza Split 07/16/2013, 07/24/2014  . Influenza, High Dose Seasonal PF 06/15/2015, 07/04/2016, 07/14/2017  . PPD Test 04/14/2014  . Pneumococcal Conjugate-13 10/31/2014  . Pneumococcal Polysaccharide-23 12/01/2004, 03/23/2016  . Tdap 04/06/2011  . Zoster 07/16/2013    Preventative care: Last colonoscopy: 2010 Last mammogram: 02/2016 ordered, needs to schedule Last pap smear/pelvic exam: remote, declines another DEXA: will get with MG CXR:11/2010 [place] AB 2014  Prior vaccinations: TD or Tdap: 2012  Influenza: 2018 Pneumococcal: 2017 Prevnar13: 2016 Shingles/Zostavax: 2014  Names of Other Physician/Practitioners you currently use: 1. [facility]- here for primary care 2. [facility], Dr. Willette Brace, eye doctor, last visit 03/2018, report requested 3. Dr. Bjorn Pippin, dentist, last visit 2019  Patient Care Team: [name], MD as PCP - General (Internal Medicine) Inda Castle, MD (Inactive) as Consulting Physician (Gastroenterology) Rozetta Nunnery, MD as Consulting Physician (Otolaryngology) Rona Ravens, [place] (Optometry) Justice Britain, MD as Consulting Physician (Orthopedic Surgery)  Surgical: She  has a past surgical history that includes ORIF ankle fracture (Left, 2000); Abdominal hysterectomy (1998); and Shoulder surgery (Right, 02/2017). Family Her family history includes Cancer in her mother; Diabetes in her father; Heart disease in her father and son; Hypertension in her father. Social history  She reports that she has never smoked. She has never used smokeless tobacco. She reports that she drinks alcohol. She reports that she does not use drugs.  MEDICARE WELLNESS OBJECTIVES: Physical activity: Current Exercise Habits:  Home exercise routine, Type of exercise: walking, Time (Minutes): 15, Frequency (Times/Week): 2, Weekly Exercise (Minutes/Week): 30, Intensity: Mild, Exercise limited by: None identified Cardiac risk factors: Cardiac Risk Factors include: advanced age (>64men, >53 women);dyslipidemia;hypertension;diabetes mellitus Depression/mood screen:   Depression screen [facility] 2/9 05/23/2018  Decreased Interest 1  Down, Depressed, Hopeless 0  PHQ - 2 Score 1    ADLs:  In your present state of health, do you have any difficulty performing the following activities: 05/23/2018 02/11/2018  Hearing? Y N  Comment meniere's - needs to follow up with Dr. Lucia Gaskins -  Vision? N N  Difficulty concentrating or making decisions? N N  Walking or climbing stairs? N N  Dressing or bathing? N N  Doing errands, shopping? N N  Some recent data might be hidden     Cognitive Testing  Alert? Yes  Normal Appearance?Yes  Oriented to person? Yes  Place? Yes   Time? Yes  Recall of three objects?  Yes  Can perform simple calculations? Yes  Displays appropriate judgment?Yes  Can read the correct time from a watch face?Yes  EOL planning: Does Patient Have a Medical Advance Directive?: No Would patient like information on creating a medical advance directive?: No - Patient declined   Review of Systems  Constitutional: Negative.   HENT: Negative.   Eyes: Negative.   Respiratory: Negative.   Cardiovascular: Negative.   Gastrointestinal: Negative.   Genitourinary: Negative.   Musculoskeletal: Negative.   Skin: Negative.   Neurological: Positive for tingling (bilateral toes).  Endo/Heme/Allergies: Negative.   Psychiatric/Behavioral: Negative.     Objective:   Today's Vitals   05/23/18 0904  BP: 130/80  Pulse: 91  Temp: (!) 97.5 F (36.4 C)  SpO2: 97%  Weight: 155 lb (70.3 kg)  Height: 5' (1.524 m)   Body mass index is 30.27 kg/m.  General appearance: alert, no distress, WD/WN,  female HEENT: normocephalic,  sclerae anicteric, TMs pearly, nares patent, no discharge or erythema, pharynx normal Oral cavity: MMM, no lesions Neck: supple, no lymphadenopathy, no thyromegaly, no masses Heart: RRR, normal S1, S2, no murmurs Lungs: CTA bilaterally, no wheezes, rhonchi, or rales Abdomen: +bs, soft, non tender, non distended, no masses, no hepatomegaly, no splenomegaly Musculoskeletal: nontender, no swelling, no obvious deformity Extremities: no edema, no cyanosis, no clubbing Pulses: 2+ symmetric, upper and lower extremities, normal cap refill Neurological: alert, oriented x 3, CN2-12 intact, strength normal upper extremities and lower extremities, sensation normal throughout, DTRs 2+ throughout, no cerebellar signs, gait normal Psychiatric: normal affect, behavior normal, pleasant  Breast: defer Gyn: defer Rectal: defer   Medicare Attestation I have personally reviewed: The patient's medical and social history Their use of alcohol, tobacco or illicit drugs Their current medications and supplements The patient's functional ability including ADLs,fall risks, home safety risks, cognitive, and hearing and visual impairment Diet and physical activities Evidence for depression or mood disorders  The patient's weight, height, BMI, and visual acuity have been recorded in the chart.  I have made referrals,  counseling, and provided education to the patient based on review of the above and I have provided the patient with a written personalized care plan for preventive services.     Izora Ribas, NP   05/23/2018

## 2018-05-23 ENCOUNTER — Encounter: Payer: Self-pay | Admitting: Adult Health

## 2018-05-23 ENCOUNTER — Ambulatory Visit (INDEPENDENT_AMBULATORY_CARE_PROVIDER_SITE_OTHER): Payer: Medicare HMO | Admitting: Adult Health

## 2018-05-23 VITALS — BP 130/80 | HR 91 | Temp 97.5°F | Ht 60.0 in | Wt 155.0 lb

## 2018-05-23 DIAGNOSIS — Z0000 Encounter for general adult medical examination without abnormal findings: Secondary | ICD-10-CM

## 2018-05-23 DIAGNOSIS — R6889 Other general symptoms and signs: Secondary | ICD-10-CM | POA: Diagnosis not present

## 2018-05-23 DIAGNOSIS — Z79899 Other long term (current) drug therapy: Secondary | ICD-10-CM | POA: Diagnosis not present

## 2018-05-23 DIAGNOSIS — Z0001 Encounter for general adult medical examination with abnormal findings: Secondary | ICD-10-CM

## 2018-05-23 DIAGNOSIS — E782 Mixed hyperlipidemia: Secondary | ICD-10-CM | POA: Diagnosis not present

## 2018-05-23 DIAGNOSIS — I10 Essential (primary) hypertension: Secondary | ICD-10-CM | POA: Diagnosis not present

## 2018-05-23 DIAGNOSIS — E669 Obesity, unspecified: Secondary | ICD-10-CM | POA: Diagnosis not present

## 2018-05-23 DIAGNOSIS — E2839 Other primary ovarian failure: Secondary | ICD-10-CM | POA: Diagnosis not present

## 2018-05-23 DIAGNOSIS — H8109 Meniere's disease, unspecified ear: Secondary | ICD-10-CM | POA: Diagnosis not present

## 2018-05-23 DIAGNOSIS — E559 Vitamin D deficiency, unspecified: Secondary | ICD-10-CM

## 2018-05-23 DIAGNOSIS — K760 Fatty (change of) liver, not elsewhere classified: Secondary | ICD-10-CM

## 2018-05-23 DIAGNOSIS — E1140 Type 2 diabetes mellitus with diabetic neuropathy, unspecified: Secondary | ICD-10-CM

## 2018-05-23 MED ORDER — SIMVASTATIN 40 MG PO TABS: ORAL_TABLET | ORAL | 1 refills | Status: DC

## 2018-05-23 NOTE — Patient Instructions (Addendum)
  Regina Cervantes , Thank you for taking time to come for your Medicare Wellness Visit. I appreciate your ongoing commitment to your health goals. Please review the following plan we discussed and let me know if I can assist you in the future.   These are the goals we discussed: Goals    . Blood Pressure < 130/80    . LDL CALC < 70    . Weight (lb) < 145 lb (65.8 kg)       This is a list of the screening recommended for you and due dates:  Health Maintenance  Topic Date Due  . DEXA scan (bone density measurement)  08/03/2014  . Mammogram  02/18/2018  . Eye exam for diabetics  03/29/2018  . Flu Shot  06/19/2018*  . Complete foot exam   07/31/2018  . Hemoglobin A1C  08/12/2018  . Colon Cancer Screening  03/28/2019  . Tetanus Vaccine  04/05/2021  .  Hepatitis C: One time screening is recommended by Center for Disease Control  (CDC) for  adults born from [birth year] through 1967.   Completed  . Pneumonia vaccines  Completed  *Topic was postponed. The date shown is not the original due date.      Drink 1/2 your body weight in fluid ounces of water daily; drink a tall glass of water 30 min before meals  Don't eat until you're stuffed- listen to your stomach and eat until you are 80% full   Try eating off of a salad plate; wait 10 min after finishing before going back for seconds  Start by eating the vegetables on your plate; aim for 50% of your meals to be fruits or vegetables  Then eat your protein - lean meats (grass fed if possible), fish, beans, nuts in moderation  Eat your carbs/starch last ONLY if you still are hungry. If you can, stop before finishing it all  Avoid sugar and flour - the closer it looks to it's original form in nature, typically the better it is for you  Splurge in moderation - "assign" days when you get to splurge and have the "bad stuff" - I like to follow a 80% - 20% plan- "good" choices 80 % of the time, "bad" choices in moderation 20% of the time  Simple  equation is: Calories out > calories in = weight loss - even if you eat the bad stuff, if you limit portions, you will still lose weight

## 2018-05-24 LAB — CBC WITH DIFFERENTIAL/PLATELET
BASOS ABS: 49 {cells}/uL (ref 0–200)
Basophils Relative: 0.5 %
EOS ABS: 235 {cells}/uL (ref 15–500)
EOS PCT: 2.4 %
HEMATOCRIT: 38.4 % (ref 35.0–45.0)
Hemoglobin: 12.5 g/dL (ref 11.7–15.5)
LYMPHS ABS: 3244 {cells}/uL (ref 850–3900)
MCH: 26.8 pg — AB (ref 27.0–33.0)
MCHC: 32.6 g/dL (ref 32.0–36.0)
MCV: 82.2 fL (ref 80.0–100.0)
MPV: 10.6 fL (ref 7.5–12.5)
Monocytes Relative: 5 %
NEUTROS ABS: 5782 {cells}/uL (ref 1500–7800)
NEUTROS PCT: 59 %
Platelets: 264 10*3/uL (ref 140–400)
RBC: 4.67 10*6/uL (ref 3.80–5.10)
RDW: 13.8 % (ref 11.0–15.0)
Total Lymphocyte: 33.1 %
WBC: 9.8 10*3/uL (ref 3.8–10.8)
WBCMIX: 490 {cells}/uL (ref 200–950)

## 2018-05-24 LAB — LIPID PANEL
Cholesterol: 169 mg/dL (ref <200)
HDL: 47 mg/dL — ABNORMAL LOW (ref >50)
LDL CHOLESTEROL (CALC): 95 mg/dL
Non-HDL Cholesterol (Calc): 122 mg/dL (calc) (ref <130)
Total CHOL/HDL Ratio: 3.6 (calc) (ref <5.0)
Triglycerides: 168 mg/dL — ABNORMAL HIGH (ref <150)

## 2018-05-24 LAB — COMPLETE METABOLIC PANEL WITH GFR
AG RATIO: 1.5 (calc) (ref 1.0–2.5)
ALBUMIN MSPROF: 4.3 g/dL (ref 3.6–5.1)
ALKALINE PHOSPHATASE (APISO): 78 U/L (ref 33–130)
ALT: 21 U/L (ref 6–29)
AST: 23 U/L (ref 10–35)
BUN: 13 mg/dL (ref 7–25)
CHLORIDE: 98 mmol/L (ref 98–110)
CO2: 27 mmol/L (ref 20–32)
Calcium: 10 mg/dL (ref 8.6–10.4)
Creat: 0.73 mg/dL (ref 0.50–0.99)
GFR, Est African American: 98 mL/min/{1.73_m2} (ref >=60)
GFR, Est Non African American: 85 mL/min/{1.73_m2} (ref >=60)
GLOBULIN: 2.8 g/dL (ref 1.9–3.7)
Glucose, Bld: 135 mg/dL — ABNORMAL HIGH (ref 65–99)
Potassium: 4.1 mmol/L (ref 3.5–5.3)
SODIUM: 136 mmol/L (ref 135–146)
Total Bilirubin: 0.4 mg/dL (ref 0.2–1.2)
Total Protein: 7.1 g/dL (ref 6.1–8.1)

## 2018-05-24 LAB — TSH: TSH: 2.16 m[IU]/L (ref 0.40–4.50)

## 2018-05-24 LAB — MAGNESIUM: MAGNESIUM: 1.7 mg/dL (ref 1.5–2.5)

## 2018-05-24 LAB — HEMOGLOBIN A1C
EAG (MMOL/L): 7.9 (calc)
Hgb A1c MFr Bld: 6.6 % of total Hgb — ABNORMAL HIGH (ref <5.7)
Mean Plasma Glucose: 143 (calc)

## 2018-06-18 ENCOUNTER — Encounter: Payer: Self-pay | Admitting: Podiatry

## 2018-06-18 ENCOUNTER — Ambulatory Visit: Payer: Medicare HMO | Admitting: Podiatry

## 2018-06-18 ENCOUNTER — Ambulatory Visit (INDEPENDENT_AMBULATORY_CARE_PROVIDER_SITE_OTHER): Payer: Medicare HMO

## 2018-06-18 VITALS — BP 139/81 | HR 95 | Resp 16 | Ht 60.0 in | Wt 152.0 lb

## 2018-06-18 DIAGNOSIS — M2012 Hallux valgus (acquired), left foot: Secondary | ICD-10-CM

## 2018-06-18 DIAGNOSIS — Z969 Presence of functional implant, unspecified: Secondary | ICD-10-CM

## 2018-06-18 DIAGNOSIS — M21612 Bunion of left foot: Secondary | ICD-10-CM

## 2018-06-18 DIAGNOSIS — M7752 Other enthesopathy of left foot: Secondary | ICD-10-CM

## 2018-06-18 DIAGNOSIS — M779 Enthesopathy, unspecified: Secondary | ICD-10-CM

## 2018-06-18 DIAGNOSIS — G5762 Lesion of plantar nerve, left lower limb: Secondary | ICD-10-CM

## 2018-06-18 DIAGNOSIS — M205X2 Other deformities of toe(s) (acquired), left foot: Secondary | ICD-10-CM | POA: Diagnosis not present

## 2018-06-18 NOTE — Progress Notes (Signed)
Subjective:  Patient ID: Regina Cervantes, female    DOB: [birth year],  MRN: [id number]  Chief Complaint  Patient presents with  . Bunions    L hallux joint pain x 2 weeks w/ pain (5/10) -no injury Tx: sugical shoe, and epsom salt soakings -wprst w/ tight shoes or pressure    67 y.o. female presents with the above complaint.  Reports pain to the left foot and the big toe joint.  States that it helps his pain and irritation with shoe gear.  Present for the past 2 weeks with 5 out of 10 sharp pain.  Has been wearing her surgical shoe that she had when she fractured her ankle.  Has used Epsom salts soaks without relief.  Denies injury.  Worse with tight shoes or pressure.  Reports fracturing her ankle several years ago does not have pain in the ankle at this time.  Review of Systems: Negative except as noted in the HPI. Denies N/V/F/Ch.  Musculoskeletal: Positive for arthralgias and myalgias.  All other systems reviewed and are negative.    Past Medical History:  Diagnosis Date  . Allergy   . Hyperlipidemia   . Hypertension   . Iron deficiency   . Meniere's disease   . Neuropathy   . Type II or unspecified type diabetes mellitus without mention of complication, not stated as uncontrolled   . Vitamin D deficiency     Current Outpatient Medications:  .  aspirin 81 MG chewable tablet, Chew by mouth daily., Disp: , Rfl:  .  BAYER MICROLET LANCETS lancets, Use as instructed, Disp: 100 each, Rfl: 12 .  bisoprolol-hydrochlorothiazide (ZIAC) 5-6.25 MG tablet, TAKE 1 TABLET DAILY, Disp: 90 tablet, Rfl: 1 .  Blood Glucose Monitoring Suppl (CONTOUR BLOOD GLUCOSE SYSTEM) DEVI, Test Blood sugar once daily, Disp: 1 Device, Rfl: 0 .  cetirizine (ZYRTEC) 10 MG tablet, Take 10 mg by mouth daily., Disp: , Rfl:  .  Cholecalciferol (VITAMIN D PO), Take 6,000 Int'l Units by mouth daily. , Disp: , Rfl:  .  ferrous sulfate dried (SLOW FE) 160 (50 FE) MG TBCR SR tablet, Take 160 mg by mouth daily., Disp:  , Rfl:  .  glucose blood (BAYER CONTOUR TEST) test strip, Use as instructed, Disp: 100 each, Rfl: 12 .  hydrochlorothiazide (HYDRODIURIL) 25 MG tablet, TAKE 1 TABLET DAILY FOR    BLOOD PRESSURE AND FLUID, Disp: 90 tablet, Rfl: 1 .  Magnesium 500 MG CAPS, Take 500 mg by mouth daily., Disp: , Rfl:  .  meclizine (ANTIVERT) 25 MG tablet, Take 1 tablet 3 x / day as needed for Dizziness, Disp: 90 tablet, Rfl: 1 .  metFORMIN (GLUCOPHAGE-XR) 500 MG 24 hr tablet, Take 2 tablets (1,000 mg total) by mouth 2 (two) times daily., Disp: 360 tablet, Rfl: 1 .  montelukast (SINGULAIR) 10 MG tablet, TAKE 1 TABLET DAILY FOR    ALLERGIES, Disp: 90 tablet, Rfl: 1 .  olmesartan (BENICAR) 40 MG tablet, Take 1/2- 1 tab for blood pressure., Disp: 90 tablet, Rfl: 1 .  OVER THE COUNTER MEDICATION, One Source supplement 2 times daily for hair,skin and nails, Disp: , Rfl:  .  OVER THE COUNTER MEDICATION, Nasogel 2 squirts each nostril BID PRN., Disp: , Rfl:  .  phentermine (ADIPEX-P) 37.5 MG tablet, TAKE 1/2 TO 1 (ONE-HALF TO ONE) TABLET BY MOUTH ONCE DAILY IN THE MORNING FOR DIETING AND WEIGHT LOSS, Disp: 30 tablet, Rfl: 5 .  promethazine (PHENERGAN) 25 MG tablet, Take 1 tablet every 4  hours if needed for nausea or dizziness, Disp: 90 tablet, Rfl: 1 .  simvastatin (ZOCOR) 40 MG tablet, Take 1 tab daily in the evening for cholesterol., Disp: 90 tablet, Rfl: 1  Social History   Tobacco Use  Smoking Status Never Smoker  Smokeless Tobacco Never Used    Allergies  Allergen Reactions  . Penicillins Other (See Comments)  . Lisinopril Cough   Objective:   Vitals:   06/18/18 0857  BP: 139/81  Pulse: 95  Resp: 16   Body mass index is 29.69 kg/m. Constitutional Well developed. Well nourished.  Vascular Dorsalis pedis pulses palpable bilaterally. Posterior tibial pulses palpable bilaterally. Capillary refill normal to all digits.  No cyanosis or clubbing noted. Pedal hair growth normal.  Neurologic Normal  speech. Oriented to person, place, and time. Epicritic sensation to light touch grossly present bilaterally.  Dermatologic Nails well groomed and normal in appearance. No open wounds. No skin lesions.  Orthopedic: Normal joint ROM without pain or crepitus bilaterally. No visible deformities. Pain palpation about the left first MPJ dorsally, pain on range of motion with crepitus Pain to palpation about the third interspace left Prominent hardware laterally about the ankle without pain to palpation   Radiographs: Taken and reviewed.  Dorsal osteophyte first MPJ, hallux valgus deformity first MPJ midfoot degenerative joint changes.  Evidence of prior ankle fracture with intact hardware, plantar calcaneal enthesophyte. Assessment:   1. Capsulitis   2. Hallux valgus with bunions of left foot   3. Hallux limitus of left foot   4. Morton neuroma, left   5. Retained orthopedic hardware    Plan:  Patient was evaluated and treated and all questions answered.  Hallux limitus L -X-rays reviewed with patient -Educated on etiology -Injection delivered first MPJ as below -Discussed possible cheilectomy pursue placement in the future should joint continue to be symptomatic  Procedure: Joint Injection Location: Left 1st MPJ joint Skin Prep: Alcohol. Injectate: 0.5 cc 1% lidocaine plain, 0.5 cc dexamethasone phosphate. Disposition: Patient tolerated procedure well. Injection site dressed with a band-aid.  Morton's neuroma left third interspace -Educated on etiology -Injection delivered as below  Procedure: Neuroma Injection Location: Left 3rd interspace Skin Prep: Alcohol. Injectate: 0.5 cc 0.5% marcaine plain, 0.5 cc dexamethasone phosphate. Disposition: Patient tolerated procedure well. Injection site dressed with a band-aid.  History of left ankle fracture with retained hardware -Discussed the patient possible removal of the hardware if symptomatic.  Not hurting her at this  time.  Return in about 3 weeks (around 07/09/2018) for Hallux Limitus L, Neuroma L injection f/u.

## 2018-06-18 NOTE — Patient Instructions (Signed)

## 2018-06-19 ENCOUNTER — Ambulatory Visit (INDEPENDENT_AMBULATORY_CARE_PROVIDER_SITE_OTHER): Payer: Medicare HMO | Admitting: *Deleted

## 2018-06-19 DIAGNOSIS — Z23 Encounter for immunization: Secondary | ICD-10-CM

## 2018-06-20 ENCOUNTER — Other Ambulatory Visit: Payer: Self-pay | Admitting: Podiatry

## 2018-06-20 DIAGNOSIS — Z969 Presence of functional implant, unspecified: Secondary | ICD-10-CM

## 2018-06-20 DIAGNOSIS — M205X2 Other deformities of toe(s) (acquired), left foot: Secondary | ICD-10-CM

## 2018-06-20 DIAGNOSIS — M2012 Hallux valgus (acquired), left foot: Secondary | ICD-10-CM

## 2018-06-20 DIAGNOSIS — M21612 Bunion of left foot: Secondary | ICD-10-CM

## 2018-06-20 DIAGNOSIS — M779 Enthesopathy, unspecified: Secondary | ICD-10-CM

## 2018-06-20 DIAGNOSIS — G5762 Lesion of plantar nerve, left lower limb: Secondary | ICD-10-CM

## 2018-06-24 ENCOUNTER — Other Ambulatory Visit: Payer: Self-pay | Admitting: Adult Health

## 2018-06-24 ENCOUNTER — Other Ambulatory Visit: Payer: Self-pay | Admitting: Physician Assistant

## 2018-06-24 MED ORDER — MONTELUKAST SODIUM 10 MG PO TABS: ORAL_TABLET | ORAL | 1 refills | Status: DC

## 2018-06-24 MED ORDER — SIMVASTATIN 40 MG PO TABS: ORAL_TABLET | ORAL | 1 refills | Status: DC

## 2018-06-24 MED ORDER — HYDROCHLOROTHIAZIDE 25 MG PO TABS: ORAL_TABLET | ORAL | 1 refills | Status: DC

## 2018-06-24 MED ORDER — BISOPROLOL-HYDROCHLOROTHIAZIDE 5-6.25 MG PO TABS: 1.0000 | ORAL_TABLET | Freq: Every day | ORAL | 1 refills | Status: DC

## 2018-07-04 ENCOUNTER — Other Ambulatory Visit: Payer: Self-pay | Admitting: *Deleted

## 2018-07-04 MED ORDER — SIMVASTATIN 40 MG PO TABS: ORAL_TABLET | ORAL | 1 refills | Status: DC

## 2018-07-05 ENCOUNTER — Other Ambulatory Visit: Payer: Self-pay

## 2018-07-05 NOTE — Telephone Encounter (Signed)
ENTERED IN ERROR

## 2018-07-06 ENCOUNTER — Ambulatory Visit: Payer: Medicare Other

## 2018-07-09 ENCOUNTER — Ambulatory Visit: Payer: Medicare HMO | Admitting: Podiatry

## 2018-07-09 DIAGNOSIS — M2012 Hallux valgus (acquired), left foot: Secondary | ICD-10-CM

## 2018-07-09 DIAGNOSIS — M205X2 Other deformities of toe(s) (acquired), left foot: Secondary | ICD-10-CM

## 2018-07-09 DIAGNOSIS — G5762 Lesion of plantar nerve, left lower limb: Secondary | ICD-10-CM | POA: Diagnosis not present

## 2018-07-09 DIAGNOSIS — M21612 Bunion of left foot: Secondary | ICD-10-CM | POA: Diagnosis not present

## 2018-07-09 DIAGNOSIS — Z969 Presence of functional implant, unspecified: Secondary | ICD-10-CM | POA: Diagnosis not present

## 2018-07-10 ENCOUNTER — Ambulatory Visit
Admission: RE | Admit: 2018-07-10 | Discharge: 2018-07-10 | Disposition: A | Discharge status: Home / Self Care | Payer: Medicare HMO | Source: Ambulatory Visit | Attending: Internal Medicine | Admitting: Internal Medicine

## 2018-07-10 DIAGNOSIS — Z1231 Encounter for screening mammogram for malignant neoplasm of breast: Secondary | ICD-10-CM | POA: Diagnosis not present

## 2018-07-16 NOTE — Progress Notes (Signed)
Subjective:  Patient ID: Regina Cervantes, female    DOB: 03-08-1951,  MRN: [id number]  Chief Complaint  Patient presents with  . hallux limitus    F/U L hallux limitus and neuroma Pt. stated," the shoe helped for 4 days, but on the 5th day the pain started coming back. I bought some cusions and it has helped a lot, no pain at all." Tx: advil, icing and cushions    67 y.o. female presents with the above complaint.  History as above.  Review of Systems: Negative except as noted in the HPI. Denies N/V/F/Ch.  Musculoskeletal: Positive for arthralgias and myalgias.  All other systems reviewed and are negative.    Past Medical History:  Diagnosis Date  . Allergy   . Hyperlipidemia   . Hypertension   . Iron deficiency   . Meniere's disease   . Neuropathy   . Type II or unspecified type diabetes mellitus without mention of complication, not stated as uncontrolled   . Vitamin D deficiency     Current Outpatient Medications:  .  aspirin 81 MG chewable tablet, Chew by mouth daily., Disp: , Rfl:  .  BAYER MICROLET LANCETS lancets, Use as instructed, Disp: 100 each, Rfl: 12 .  bisoprolol-hydrochlorothiazide (ZIAC) 5-6.25 MG tablet, Take 1 tablet by mouth daily., Disp: 90 tablet, Rfl: 1 .  Blood Glucose Monitoring Suppl (CONTOUR BLOOD GLUCOSE SYSTEM) DEVI, Test Blood sugar once daily, Disp: 1 Device, Rfl: 0 .  cetirizine (ZYRTEC) 10 MG tablet, Take 10 mg by mouth daily., Disp: , Rfl:  .  Cholecalciferol (VITAMIN D PO), Take 6,000 Int'l Units by mouth daily. , Disp: , Rfl:  .  ferrous sulfate dried (SLOW FE) 160 (50 FE) MG TBCR SR tablet, Take 160 mg by mouth daily., Disp: , Rfl:  .  glucose blood (BAYER CONTOUR TEST) test strip, Use as instructed, Disp: 100 each, Rfl: 12 .  hydrochlorothiazide (HYDRODIURIL) 25 MG tablet, Take 1/2-1 tab daily or as directed for BP goal <130/80., Disp: 90 tablet, Rfl: 1 .  Magnesium 500 MG CAPS, Take 500 mg by mouth daily., Disp: , Rfl:  .  meclizine  (ANTIVERT) 25 MG tablet, Take 1 tablet 3 x / day as needed for Dizziness, Disp: 90 tablet, Rfl: 1 .  metFORMIN (GLUCOPHAGE-XR) 500 MG 24 hr tablet, Take 2 tablets (1,000 mg total) by mouth 2 (two) times daily., Disp: 360 tablet, Rfl: 1 .  montelukast (SINGULAIR) 10 MG tablet, TAKE 1 TABLET DAILY FOR    ALLERGIES, Disp: 90 tablet, Rfl: 1 .  olmesartan (BENICAR) 40 MG tablet, Take 1/2- 1 tab for blood pressure., Disp: 90 tablet, Rfl: 1 .  OVER THE COUNTER MEDICATION, One Source supplement 2 times daily for hair,skin and nails, Disp: , Rfl:  .  OVER THE COUNTER MEDICATION, Nasogel 2 squirts each nostril BID PRN., Disp: , Rfl:  .  phentermine (ADIPEX-P) 37.5 MG tablet, TAKE 1/2 TO 1 (ONE-HALF TO ONE) TABLET BY MOUTH ONCE DAILY IN THE MORNING FOR DIETING AND WEIGHT LOSS, Disp: 30 tablet, Rfl: 5 .  promethazine (PHENERGAN) 25 MG tablet, Take 1 tablet every 4 hours if needed for nausea or dizziness, Disp: 90 tablet, Rfl: 1 .  simvastatin (ZOCOR) 40 MG tablet, Take 1/2-1 tab daily or as directed in the evening for cholesterol., Disp: 90 tablet, Rfl: 1  Social History   Tobacco Use  Smoking Status Never Smoker  Smokeless Tobacco Never Used    Allergies  Allergen Reactions  . Penicillins Other (See  Comments)  . Lisinopril Cough   Objective:   There were no vitals filed for this visit. There is no height or weight on file to calculate BMI. Constitutional Well developed. Well nourished.  Vascular Dorsalis pedis pulses palpable bilaterally. Posterior tibial pulses palpable bilaterally. Capillary refill normal to all digits.  No cyanosis or clubbing noted. Pedal hair growth normal.  Neurologic Normal speech. Oriented to person, place, and time. Epicritic sensation to light touch grossly present bilaterally.  Dermatologic Nails well groomed and normal in appearance. No open wounds. No skin lesions.  Orthopedic: Normal joint ROM without pain or crepitus bilaterally. No visible  deformities. No pain to palpation.   Radiographs:  06/18/17. Dorsal osteophyte first MPJ, hallux valgus deformity first MPJ midfoot degenerative joint changes.  Evidence of prior ankle fracture with intact hardware, plantar calcaneal enthesophyte. Assessment:   1. Hallux valgus with bunions of left foot   2. Morton neuroma, left   3. Hallux limitus of left foot   4. Retained orthopedic hardware    Plan:  Patient was evaluated and treated and all questions answered.  Hallux limitus L -Improved, no pain today.  Morton's neuroma left third interspace -Improved, no injection today.  History of left ankle fracture with retained hardware -Hold off surgical removal  No follow-ups on file.

## 2018-07-30 ENCOUNTER — Other Ambulatory Visit: Payer: Self-pay | Admitting: Internal Medicine

## 2018-08-14 ENCOUNTER — Ambulatory Visit
Admission: RE | Admit: 2018-08-14 | Discharge: 2018-08-14 | Disposition: A | Discharge status: Home / Self Care | Payer: Medicare HMO | Source: Ambulatory Visit | Attending: Adult Health | Admitting: Adult Health

## 2018-08-14 DIAGNOSIS — E2839 Other primary ovarian failure: Secondary | ICD-10-CM

## 2018-08-14 DIAGNOSIS — Z13820 Encounter for screening for osteoporosis: Secondary | ICD-10-CM | POA: Diagnosis not present

## 2018-08-14 DIAGNOSIS — Z780 Asymptomatic menopausal state: Secondary | ICD-10-CM | POA: Diagnosis not present

## 2018-08-20 DIAGNOSIS — R69 Illness, unspecified: Secondary | ICD-10-CM | POA: Diagnosis not present

## 2018-08-22 ENCOUNTER — Ambulatory Visit (INDEPENDENT_AMBULATORY_CARE_PROVIDER_SITE_OTHER): Payer: Medicare HMO | Admitting: Internal Medicine

## 2018-08-22 ENCOUNTER — Encounter: Payer: Self-pay | Admitting: Internal Medicine

## 2018-08-22 VITALS — BP 118/76 | HR 84 | Temp 97.8°F | Resp 16 | Ht 59.75 in | Wt 149.8 lb

## 2018-08-22 DIAGNOSIS — Z136 Encounter for screening for cardiovascular disorders: Secondary | ICD-10-CM | POA: Diagnosis not present

## 2018-08-22 DIAGNOSIS — I10 Essential (primary) hypertension: Secondary | ICD-10-CM | POA: Diagnosis not present

## 2018-08-22 DIAGNOSIS — E1140 Type 2 diabetes mellitus with diabetic neuropathy, unspecified: Secondary | ICD-10-CM

## 2018-08-22 DIAGNOSIS — Z8249 Family history of ischemic heart disease and other diseases of the circulatory system: Secondary | ICD-10-CM

## 2018-08-22 DIAGNOSIS — Z1212 Encounter for screening for malignant neoplasm of rectum: Secondary | ICD-10-CM

## 2018-08-22 DIAGNOSIS — Z0001 Encounter for general adult medical examination with abnormal findings: Secondary | ICD-10-CM

## 2018-08-22 DIAGNOSIS — E559 Vitamin D deficiency, unspecified: Secondary | ICD-10-CM | POA: Diagnosis not present

## 2018-08-22 DIAGNOSIS — Z0000 Encounter for general adult medical examination without abnormal findings: Secondary | ICD-10-CM | POA: Diagnosis not present

## 2018-08-22 DIAGNOSIS — Z79899 Other long term (current) drug therapy: Secondary | ICD-10-CM

## 2018-08-22 DIAGNOSIS — Z1211 Encounter for screening for malignant neoplasm of colon: Secondary | ICD-10-CM

## 2018-08-22 DIAGNOSIS — E782 Mixed hyperlipidemia: Secondary | ICD-10-CM | POA: Diagnosis not present

## 2018-08-22 MED ORDER — PHENTERMINE HCL 37.5 MG PO TABS: ORAL_TABLET | ORAL | 5 refills | Status: DC

## 2018-08-22 MED ORDER — TOPIRAMATE 50 MG PO TABS: ORAL_TABLET | ORAL | 1 refills | Status: DC

## 2018-08-22 NOTE — Progress Notes (Signed)
[facility] [name], M.D.     Regina Cervantes. Regina Cervantes, P.A.-C [name], [place] [address] [place] [address] Telephone [phone] Telefax [phone] Annual Screening/Preventative Visit & Comprehensive Evaluation &  Examination     This very nice 67 y.o. MWF presents for a Screening /Preventative Visit & comprehensive evaluation and management of multiple medical co-morbidities.  Patient has been followed for HTN, HLD, T2_NIDDM  and Vitamin D Deficiency.      HTN predates since 2004. Patient's BP has been controlled at home and patient denies any cardiac symptoms as chest pain, palpitations, shortness of breath, dizziness or ankle swelling. Today's BP is at goal -  118/76.      Patient's hyperlipidemia is controlled with diet and medications. Patient denies myalgias or other medication SE's. Last lipids were  Lab Results  Component Value Date   CHOL 169 05/23/2018   HDL 47 (L) 05/23/2018   LDLCALC 95 05/23/2018   TRIG 168 (H) 05/23/2018   CHOLHDL 3.6 05/23/2018      Patient has hx/o Morbid Obesity (BMI 29+) and T2_NIDDM predating since 2004 and patient denies reactive hypoglycemic symptoms, visual blurring, diabetic polys or paresthesias. Patient has had success on Phentermine with weight loss of  ~ 40# over the last 3 years. Last A1c was not at goal: Lab Results  Component Value Date   HGBA1C 6.6 (H) 05/23/2018  .lastwt    Finally, patient has history of Vitamin D Deficiency  ("37" / 2014) and last Vitamin D was Normal & at goal: Lab Results  Component Value Date   VD25OH 82 02/09/2018   Current Outpatient Medications on File Prior to Visit  Medication Sig  . aspirin 81 MG chewable tablet Chew by mouth daily.  Marland Kitchen BAYER MICROLET LANCETS lancets Use as instructed  . bisoprolol-hydrochlorothiazide (ZIAC) 5-6.25 MG tablet Take 1 tablet by mouth daily.  . Blood Glucose Monitoring Suppl  (CONTOUR BLOOD GLUCOSE SYSTEM) DEVI Test Blood sugar once daily  . cetirizine (ZYRTEC) 10 MG tablet Take 10 mg by mouth daily.  . Cholecalciferol (VITAMIN D PO) Take 6,000 Int'l Units by mouth daily.   . ferrous sulfate dried (SLOW FE) 160 (50 FE) MG TBCR SR tablet Take 160 mg by mouth daily.  Marland Kitchen glucose blood (BAYER CONTOUR TEST) test strip Use as instructed  . hydrochlorothiazide (HYDRODIURIL) 25 MG tablet Take 1/2-1 tab daily or as directed for BP goal <130/80.  . Magnesium 500 MG CAPS Take 500 mg by mouth 3 (three) times daily.   . meclizine (ANTIVERT) 25 MG tablet Take 1 tablet 3 x / day as needed for Dizziness  . metFORMIN (GLUCOPHAGE-XR) 500 MG 24 hr tablet TAKE 2 TABLETS (1000 MG    TOTAL) TWO TIMES A DAY  . montelukast (SINGULAIR) 10 MG tablet TAKE 1 TABLET DAILY FOR    ALLERGIES  . olmesartan (BENICAR) 40 MG tablet Take 1/2- 1 tab for blood pressure.  Marland Kitchen OVER THE COUNTER MEDICATION Nasogel 2 squirts each nostril BID PRN.  . promethazine (PHENERGAN) 25 MG tablet Take 1 tablet every 4 hours if needed for nausea or dizziness  . simvastatin (ZOCOR) 40 MG tablet Take 1/2-1 tab daily or as directed in the evening for cholesterol.   No current facility-administered medications on file prior to visit.    Allergies  Allergen Reactions  . Penicillins Other (See Comments)  . Lisinopril Cough   Past Medical History:  Diagnosis Date  . Allergy   .  Hyperlipidemia   . Hypertension   . Iron deficiency   . Meniere's disease   . Neuropathy   . Type II or unspecified type diabetes mellitus without mention of complication, not stated as uncontrolled   . Vitamin D deficiency    Health Maintenance  Topic Date Due  . HEMOGLOBIN A1C  11/21/2018  . COLONOSCOPY  03/28/2019  . OPHTHALMOLOGY EXAM  04/17/2019  . FOOT EXAM  08/23/2019  . MAMMOGRAM  07/10/2020  . TETANUS/TDAP  04/05/2021  . INFLUENZA VACCINE  Completed  . DEXA SCAN  Completed  . Hepatitis C Screening  Completed  . PNA vac Low  Risk Adult  Completed   Immunization History  Administered Date(s) Administered  . Influenza Split 07/16/2013, 07/24/2014  . Influenza, High Dose Seasonal PF 06/15/2015, 07/04/2016, 07/14/2017, 06/19/2018  . PPD Test 04/14/2014  . Pneumococcal Conjugate-13 10/31/2014  . Pneumococcal Polysaccharide-23 12/01/2004, 03/23/2016  . Tdap 04/06/2011  . Zoster 07/16/2013   Last Colon -  03/27/2009 - Dr Deatra Ina - recc 10 yr f/u due July 2020.  Last MGM - 07/10/2018   Last Dexa BMD - 08/14/2018 T = -0.07  / Nl   Past Surgical History:  Procedure Laterality Date  . ABDOMINAL HYSTERECTOMY  1998   BSO  . ORIF ANKLE FRACTURE Left 2000  . SHOULDER SURGERY Right 02/2017   rotator cuff repair Dr. Augustin Coupe   Family History  Problem Relation Age of Onset  . Cancer Mother        PANCREAS  . Hypertension Father   . Heart disease Father   . Diabetes Father   . Heart disease Son   . Breast cancer Paternal Aunt    Social History   Tobacco Use  . Smoking status: Never Smoker  . Smokeless tobacco: Never Used  Substance Use Topics  . Alcohol use: Yes    Comment: rarely  . Drug use: No    ROS Constitutional: Denies fever, chills, weight loss/gain, headaches, insomnia,  night sweats, and change in appetite. Does c/o fatigue. Eyes: Denies redness, blurred vision, diplopia, discharge, itchy, watery eyes.  ENT: Denies discharge, congestion, post nasal drip, epistaxis, sore throat, earache, hearing loss, dental pain, Tinnitus, Vertigo, Sinus pain, snoring.  Cardio: Denies chest pain, palpitations, irregular heartbeat, syncope, dyspnea, diaphoresis, orthopnea, PND, claudication, edema Respiratory: denies cough, dyspnea, DOE, pleurisy, hoarseness, laryngitis, wheezing.  Gastrointestinal: Denies dysphagia, heartburn, reflux, water brash, pain, cramps, nausea, vomiting, bloating, diarrhea, constipation, hematemesis, melena, hematochezia, jaundice, hemorrhoids Genitourinary: Denies dysuria, frequency,  urgency, nocturia, hesitancy, discharge, hematuria, flank pain Breast: Breast lumps, nipple discharge, bleeding.  Musculoskeletal: Denies arthralgia, myalgia, stiffness, Jt. Swelling, pain, limp, and strain/sprain. Denies falls. Skin: Denies puritis, rash, hives, warts, acne, eczema, changing in skin lesion Neuro: No weakness, tremor, incoordination, spasms, paresthesia, pain Psychiatric: Denies confusion, memory loss, sensory loss. Denies Depression. Endocrine: Denies change in weight, skin, hair change, nocturia, and paresthesia, diabetic polys, visual blurring, hyper / hypo glycemic episodes.  Heme/Lymph: No excessive bleeding, bruising, enlarged lymph nodes.  Physical Exam  BP 118/76   Pulse 84   Temp 97.8 F (36.6 C)   Resp 16   Ht 4' 11.75" (1.518 m)   Wt 149 lb 12.8 oz (67.9 kg)   BMI 29.50 kg/m   General Appearance: Well nourished, well groomed and in no apparent distress.  Eyes: PERRLA, EOMs, conjunctiva no swelling or erythema, normal fundi and vessels. Sinuses: No frontal/maxillary tenderness ENT/Mouth: EACs patent / TMs  nl. Nares clear without erythema, swelling, mucoid exudates.  Oral hygiene is good. No erythema, swelling, or exudate. Tongue normal, non-obstructing. Tonsils not swollen or erythematous. Hearing normal.  Neck: Supple, thyroid not palpable. No bruits, nodes or JVD. Respiratory: Respiratory effort normal.  BS equal and clear bilateral without rales, rhonci, wheezing or stridor. Cardio: Heart sounds are normal with regular rate and rhythm and no murmurs, rubs or gallops. Peripheral pulses are normal and equal bilaterally without edema. No aortic or femoral bruits. Chest: symmetric with normal excursions and percussion. Breasts: Symmetric, without lumps, nipple discharge, retractions, or fibrocystic changes.  Abdomen: Flat, soft with bowel sounds active. Nontender, no guarding, rebound, hernias, masses, or organomegaly.  Lymphatics: Non tender without  lymphadenopathy.  Genitourinary:  Musculoskeletal: Full ROM all peripheral extremities, joint stability, 5/5 strength, and normal gait. Skin: Warm and dry without rashes, lesions, cyanosis, clubbing or  ecchymosis.  Neuro: Cranial nerves intact, reflexes equal bilaterally. Normal muscle tone, no cerebellar symptoms. Sensation sl decreased in a stocking distribution  to touch, vibratory and Monofilament to the toes bilaterally. Pysch: Alert and oriented X 3, normal affect, Insight and Judgment appropriate.   Assessment and Plan  1. Annual Preventative Screening Examination  2. Essential hypertension  - EKG 12-Lead - Urinalysis, Routine w reflex microscopic - Microalbumin / creatinine urine ratio - CBC with Differential/Platelet - COMPLETE METABOLIC PANEL WITH GFR - Magnesium - TSH  3. Hyperlipidemia, mixed  - EKG 12-Lead - Lipid panel - TSH  4. Type 2 diabetes mellitus with sensory neuropathy (HCC)  - EKG 12-Lead - Urinalysis, Routine w reflex microscopic - Microalbumin / creatinine urine ratio - HM DIABETES FOOT EXAM - LOW EXTREMITY NEUR EXAM DOCUM - Hemoglobin A1c - Insulin, random  5. Vitamin D deficiency  - VITAMIN D 25 Hydroxyl  6. Screening for colorectal cancer  - POC Hemoccult Bld/Stl  7. Screening for ischemic heart disease  - EKG 12-Lead - Lipid panel  8. FHx: heart disease  - EKG 12-Lead  9. Medication management  - Urinalysis, Routine w reflex microscopic - Microalbumin / creatinine urine ratio - CBC with Differential/Platelet - COMPLETE METABOLIC PANEL WITH GFR - Magnesium - Lipid panel - TSH - Hemoglobin A1c - Insulin, random - VITAMIN D 25 Hydroxyl        Patient was counseled in prudent diet to achieve/maintain BMI less than 25 for weight control, BP monitoring, regular exercise and medications. Discussed med's effects and SE's. Screening labs and tests as requested with regular follow-up as recommended. Over 40 minutes of exam,  counseling, chart review and high complex critical decision making was performed.

## 2018-08-22 NOTE — Patient Instructions (Signed)

## 2018-08-23 LAB — COMPLETE METABOLIC PANEL WITH GFR
AG Ratio: 1.5 (calc) (ref 1.0–2.5)
ALT: 20 U/L (ref 6–29)
AST: 22 U/L (ref 10–35)
Albumin: 4.3 g/dL (ref 3.6–5.1)
Alkaline phosphatase (APISO): 94 U/L (ref 33–130)
BUN: 12 mg/dL (ref 7–25)
CO2: 31 mmol/L (ref 20–32)
Calcium: 10.1 mg/dL (ref 8.6–10.4)
Chloride: 97 mmol/L — ABNORMAL LOW (ref 98–110)
Creat: 0.6 mg/dL (ref 0.50–0.99)
GFR, Est African American: 108 mL/min/{1.73_m2} (ref >=60)
GFR, Est Non African American: 93 mL/min/{1.73_m2} (ref >=60)
Globulin: 2.9 g/dL (calc) (ref 1.9–3.7)
Glucose, Bld: 118 mg/dL — ABNORMAL HIGH (ref 65–99)
POTASSIUM: 4 mmol/L (ref 3.5–5.3)
Sodium: 135 mmol/L (ref 135–146)
Total Bilirubin: 0.5 mg/dL (ref 0.2–1.2)
Total Protein: 7.2 g/dL (ref 6.1–8.1)

## 2018-08-23 LAB — CBC WITH DIFFERENTIAL/PLATELET
Basophils Absolute: 51 cells/uL (ref 0–200)
Basophils Relative: 0.5 %
EOS PCT: 2.4 %
Eosinophils Absolute: 245 cells/uL (ref 15–500)
HCT: 38.1 % (ref 35.0–45.0)
HEMOGLOBIN: 12.6 g/dL (ref 11.7–15.5)
Lymphs Abs: 3631 cells/uL (ref 850–3900)
MCH: 26.7 pg — ABNORMAL LOW (ref 27.0–33.0)
MCHC: 33.1 g/dL (ref 32.0–36.0)
MCV: 80.7 fL (ref 80.0–100.0)
MPV: 10.3 fL (ref 7.5–12.5)
Monocytes Relative: 5.3 %
NEUTROS ABS: 5732 {cells}/uL (ref 1500–7800)
Neutrophils Relative %: 56.2 %
Platelets: 334 10*3/uL (ref 140–400)
RBC: 4.72 10*6/uL (ref 3.80–5.10)
RDW: 13.6 % (ref 11.0–15.0)
Total Lymphocyte: 35.6 %
WBC mixed population: 541 cells/uL (ref 200–950)
WBC: 10.2 10*3/uL (ref 3.8–10.8)

## 2018-08-23 LAB — URINALYSIS, ROUTINE W REFLEX MICROSCOPIC
Bilirubin Urine: NEGATIVE
Glucose, UA: NEGATIVE
HGB URINE DIPSTICK: NEGATIVE
Ketones, ur: NEGATIVE
LEUKOCYTES UA: NEGATIVE
NITRITE: NEGATIVE
PROTEIN: NEGATIVE
Specific Gravity, Urine: 1.013 (ref 1.001–1.03)
pH: 7.5 (ref 5.0–8.0)

## 2018-08-23 LAB — MICROALBUMIN / CREATININE URINE RATIO
Creatinine, Urine: 56 mg/dL (ref 20–275)
MICROALB UR: 0.3 mg/dL
MICROALB/CREAT RATIO: 5 ug/mg{creat} (ref <30)

## 2018-08-23 LAB — LIPID PANEL
Cholesterol: 170 mg/dL (ref <200)
HDL: 44 mg/dL — ABNORMAL LOW (ref >50)
LDL Cholesterol (Calc): 99 mg/dL (calc)
Non-HDL Cholesterol (Calc): 126 mg/dL (calc) (ref <130)
Total CHOL/HDL Ratio: 3.9 (calc) (ref <5.0)
Triglycerides: 161 mg/dL — ABNORMAL HIGH (ref <150)

## 2018-08-23 LAB — HEMOGLOBIN A1C
Hgb A1c MFr Bld: 6.5 % of total Hgb — ABNORMAL HIGH (ref <5.7)
Mean Plasma Glucose: 140 (calc)
eAG (mmol/L): 7.7 (calc)

## 2018-08-23 LAB — VITAMIN D 25 HYDROXY (VIT D DEFICIENCY, FRACTURES): Vit D, 25-Hydroxy: 72 ng/mL (ref 30–100)

## 2018-08-23 LAB — MAGNESIUM: Magnesium: 2 mg/dL (ref 1.5–2.5)

## 2018-08-23 LAB — INSULIN, RANDOM: Insulin: 9.5 u[IU]/mL (ref 2.0–19.6)

## 2018-08-23 LAB — TSH: TSH: 2.13 mIU/L (ref 0.40–4.50)

## 2018-09-25 NOTE — Progress Notes (Signed)
Assessment and Plan:  [place] was seen today for fatigue and dizziness.  Diagnoses and all orders for this visit:  Fatigue, unspecified type/unsteady gait/dizziness/mental fog Normal neuro exam other than balance and mental fog Orthostatics normal today Coincides with initiation of topamax 1 month ago, strongly suspect this is the cause, instructed to taper down to 25 mg BID x 2 days, 25 mg once daily for 2 days then stop medication. Will check labs to r/o anemia, electrolyte imbalance, thyroid abnormality, changes in organ functions, B12 deficiency as this hasn't been checked recently. Imaging deferred but will consider if symptoms not improving. Discussed with Dr. Melford Aase and in agreement.  -     CBC with Differential/Platelet -     COMPLETE METABOLIC PANEL WITH GFR -     Vitamin B12 -     TSH  Further disposition pending results of labs. Discussed med's effects and SE's.   Over 15 minutes of exam, counseling, chart review, and critical decision making was performed.   Future Appointments  Date Time Provider [place]  11/22/2018  9:30 AM [name], NP [facility] None  02/22/2019  9:30 AM [name], MD [facility] None  06/03/2019  9:00 AM [name], NP [facility] None  09/06/2019  9:00 AM [name], MD [facility] None    ------------------------------------------------------------------------------------------------------------------   HPI BP 119/71   Pulse 79   Temp (!) 96.8 F (36 C)   Wt 151 lb (68.5 kg)   SpO2 99%   BMI 29.74 kg/m   68 y.o.female with hx of htn, meniere's presents for evalauation of 3-4 weeks of fatigue, mental fogginess for 2-3 weeks, dizziness/staggering gait and feels like she can't get her balance (feels different than dizziness with meniere's). She did start on Topamax 2 weeks prior to onset of symptoms. She reports she had 1 episode where she said the wrong word but denies slurring, word finding difficulty. She denies  extremity weakness, has tingling in toes/feet but this is known/chronic. She reports mild intermittent headaches but denies sudden severe HA. She denies vision change, syncope, falls.  She was recently started on topamax 50 mg x 2 in the evening since her CPE in 08/22/2018 for potential weight loss benefits.   Past Medical History:  Diagnosis Date  . Allergy   . Hyperlipidemia   . Hypertension   . Iron deficiency   . Meniere's disease   . Neuropathy   . Type II or unspecified type diabetes mellitus without mention of complication, not stated as uncontrolled   . Vitamin D deficiency      Allergies  Allergen Reactions  . Penicillins Other (See Comments)  . Lisinopril Cough    Current Outpatient Medications on File Prior to Visit  Medication Sig  . aspirin 81 MG chewable tablet Chew by mouth daily.  Marland Kitchen BAYER MICROLET LANCETS lancets Use as instructed  . bisoprolol-hydrochlorothiazide (ZIAC) 5-6.25 MG tablet Take 1 tablet by mouth daily.  . Blood Glucose Monitoring Suppl (CONTOUR BLOOD GLUCOSE SYSTEM) DEVI Test Blood sugar once daily  . cetirizine (ZYRTEC) 10 MG tablet Take 10 mg by mouth daily.  . Cholecalciferol (VITAMIN D PO) Take 6,000 Int'l Units by mouth daily.   . ferrous sulfate dried (SLOW FE) 160 (50 FE) MG TBCR SR tablet Take 160 mg by mouth daily.  Marland Kitchen glucose blood (BAYER CONTOUR TEST) test strip Use as instructed  . hydrochlorothiazide (HYDRODIURIL) 25 MG tablet Take 1/2-1 tab daily or as directed for BP goal <130/80.  . Magnesium 500 MG  CAPS Take 500 mg by mouth 3 (three) times daily.   . meclizine (ANTIVERT) 25 MG tablet Take 1 tablet 3 x / day as needed for Dizziness  . metFORMIN (GLUCOPHAGE-XR) 500 MG 24 hr tablet TAKE 2 TABLETS (1000 MG    TOTAL) TWO TIMES A DAY  . montelukast (SINGULAIR) 10 MG tablet TAKE 1 TABLET DAILY FOR    ALLERGIES  . olmesartan (BENICAR) 40 MG tablet Take 1/2- 1 tab for blood pressure.  Marland Kitchen OVER THE COUNTER MEDICATION Nasogel 2 squirts each nostril  BID PRN.  Marland Kitchen phentermine (ADIPEX-P) 37.5 MG tablet TAKE 1/2 TO 1 (ONE-HALF TO ONE) TABLET BY MOUTH ONCE DAILY IN THE MORNING FOR DIETING AND WEIGHT LOSS  . promethazine (PHENERGAN) 25 MG tablet Take 1 tablet every 4 hours if needed for nausea or dizziness  . simvastatin (ZOCOR) 40 MG tablet Take 1/2-1 tab daily or as directed in the evening for cholesterol.  . topiramate (TOPAMAX) 50 MG tablet Take 1 tablet 2 x /day  at Suppertime & Bedtime   No current facility-administered medications on file prior to visit.     ROS: all negative except above.   Physical Exam:  BP 119/71   Pulse 79   Temp (!) 96.8 F (36 C)   Wt 151 lb (68.5 kg)   SpO2 99%   BMI 29.74 kg/m   General Appearance: Well nourished, in no apparent distress. Eyes: PERRLA, EOMs, conjunctiva no swelling or erythema Sinuses: No Frontal/maxillary tenderness ENT/Mouth: No erythema, swelling, or exudate on post pharynx.  Tonsils not swollen or erythematous. Hearing normal.  Neck: Supple, thyroid normal.  Respiratory: Respiratory effort normal, BS equal bilaterally without rales, rhonchi, wheezing or stridor.  Cardio: RRR with no MRGs. Brisk peripheral pulses without edema.  Abdomen: Soft, + BS.  Non tender, no guarding, rebound, hernias, masses. Lymphatics: Non tender without lymphadenopathy.  Musculoskeletal: Full ROM, 5/5 strength, unsteady gait.  Skin: Warm, dry without rashes, lesions, ecchymosis.  Neuro: Cranial nerves intact. Normal muscle tone, she completes rapid alternating movements, nose to finger, heel to shin slowly. Gait unsteady, cannot complete tandem gait, no pronator drift of extremities but very poor balance. Sensation intact. Reflexes intact, strength symmetrical throughout.  Psych: Awake and oriented X 3, normal affect, Insight and Judgment appropriate.     Izora Ribas, NP 11:46 AM [facility]

## 2018-09-26 ENCOUNTER — Ambulatory Visit (INDEPENDENT_AMBULATORY_CARE_PROVIDER_SITE_OTHER): Payer: Medicare HMO | Admitting: Adult Health

## 2018-09-26 ENCOUNTER — Encounter: Payer: Self-pay | Admitting: Adult Health

## 2018-09-26 VITALS — BP 119/71 | HR 79 | Temp 96.8°F | Wt 151.0 lb

## 2018-09-26 DIAGNOSIS — R2681 Unsteadiness on feet: Secondary | ICD-10-CM

## 2018-09-26 DIAGNOSIS — R5383 Other fatigue: Secondary | ICD-10-CM | POA: Diagnosis not present

## 2018-09-27 LAB — CBC WITH DIFFERENTIAL/PLATELET
Absolute Monocytes: 519 cells/uL (ref 200–950)
Basophils Absolute: 39 cells/uL (ref 0–200)
Basophils Relative: 0.4 %
Eosinophils Absolute: 225 cells/uL (ref 15–500)
Eosinophils Relative: 2.3 %
HCT: 37.3 % (ref 35.0–45.0)
Hemoglobin: 12.4 g/dL (ref 11.7–15.5)
Lymphs Abs: 3165 cells/uL (ref 850–3900)
MCH: 27.1 pg (ref 27.0–33.0)
MCHC: 33.2 g/dL (ref 32.0–36.0)
MCV: 81.4 fL (ref 80.0–100.0)
MPV: 10.6 fL (ref 7.5–12.5)
Monocytes Relative: 5.3 %
Neutro Abs: 5851 cells/uL (ref 1500–7800)
Neutrophils Relative %: 59.7 %
Platelets: 291 10*3/uL (ref 140–400)
RBC: 4.58 10*6/uL (ref 3.80–5.10)
RDW: 13.8 % (ref 11.0–15.0)
Total Lymphocyte: 32.3 %
WBC: 9.8 10*3/uL (ref 3.8–10.8)

## 2018-09-27 LAB — COMPLETE METABOLIC PANEL WITH GFR
AG Ratio: 1.6 (calc) (ref 1.0–2.5)
ALT: 20 U/L (ref 6–29)
AST: 19 U/L (ref 10–35)
Albumin: 4.4 g/dL (ref 3.6–5.1)
Alkaline phosphatase (APISO): 92 U/L (ref 33–130)
BILIRUBIN TOTAL: 0.3 mg/dL (ref 0.2–1.2)
BUN: 14 mg/dL (ref 7–25)
CO2: 24 mmol/L (ref 20–32)
Calcium: 9.7 mg/dL (ref 8.6–10.4)
Chloride: 99 mmol/L (ref 98–110)
Creat: 0.7 mg/dL (ref 0.50–0.99)
GFR, Est African American: 102 mL/min/{1.73_m2} (ref >=60)
GFR, Est Non African American: 88 mL/min/{1.73_m2} (ref >=60)
Globulin: 2.7 g/dL (calc) (ref 1.9–3.7)
Glucose, Bld: 125 mg/dL — ABNORMAL HIGH (ref 65–99)
Potassium: 3.6 mmol/L (ref 3.5–5.3)
Sodium: 136 mmol/L (ref 135–146)
TOTAL PROTEIN: 7.1 g/dL (ref 6.1–8.1)

## 2018-09-27 LAB — TSH: TSH: 2.3 mIU/L (ref 0.40–4.50)

## 2018-09-27 LAB — VITAMIN B12: Vitamin B-12: 357 pg/mL (ref 200–1100)

## 2018-11-12 ENCOUNTER — Other Ambulatory Visit: Payer: Self-pay | Admitting: Adult Health

## 2018-11-12 DIAGNOSIS — I10 Essential (primary) hypertension: Secondary | ICD-10-CM

## 2018-11-21 DIAGNOSIS — E1022 Type 1 diabetes mellitus with diabetic chronic kidney disease: Secondary | ICD-10-CM | POA: Insufficient documentation

## 2018-11-21 DIAGNOSIS — N182 Chronic kidney disease, stage 2 (mild): Secondary | ICD-10-CM | POA: Insufficient documentation

## 2018-11-21 NOTE — Progress Notes (Signed)
FOLLOW UP  Assessment and Plan:   Hypertension Well controlled with current medications  Monitor blood pressure at home; patient to call if consistently greater than 130/80 Continue DASH diet.   Reminder to go to the ER if any CP, SOB, nausea, dizziness, severe HA, changes vision/speech, left arm numbness and tingling and jaw pain.  Cholesterol Currently above goal; newly on pravastatin 40 mg  discussed diet and triglycerides Continue low cholesterol diet and exercise.  Check lipid panel.   Diabetes with diabetic neuropathy, unspecified Continue medication: metformin Continue diet and exercise.  Perform daily foot/skin check, notify office of any concerning changes.  Check A1C  Obesity with co morbidities Long discussion about weight loss, diet, and exercise Recommended diet heavy in fruits and veggies and low in animal meats, cheeses, and dairy products, appropriate calorie intake Discussed ideal weight for height and initial weight goal (145 lb)  Patient will work on increase fluid intake, add walking as weather permits, aim for 287 min per week, try to find exercises can do in house, increase protein and fiber intake  Patient on phentermine with perceived benefit, no SE, taking drug breaks; continue close follow up. Given goodRx for improved price, increase to full tab after taking a drug holiday   Will follow up in 3 months  Vitamin D Def At goal at last visit; continue supplementation to maintain goal of 60-100 Defer Vit D level   Continue diet and meds as discussed. Further disposition pending results of labs. Discussed med's effects and SE's.   Over 30 minutes of exam, counseling, chart review, and critical decision making was performed.   Future Appointments  Date Time Provider [place]  02/22/2019  9:30 AM [name], MD [facility] None  06/03/2019  9:00 AM [name], NP [facility] None  09/06/2019  9:00 AM [name], MD [facility] None     ----------------------------------------------------------------------------------------------------------------------  HPI 68 y.o. female  presents for 3 month follow up on hypertension, cholesterol, diabetes, weight and vitamin D deficiency.   she is prescribed phentermine for weight loss, has only been taking 1/2 tab due to cost. Didn't tolerate topamax.  While on the medication they have lost 0 lbs since last visit. They deny palpitations, anxiety, trouble sleeping, elevated BP. She reports she takes 1/2 tab of phentermine daily, and takes 3-4 week drug break.   BMI is Body mass index is 29.9 kg/m., she is working on diet and exercise. Has been cutting out soft drinks, limiting bread, sweets, ice cream, adding more fruits, vegetables. She has been walking as weather allows. She drinks 3 bottles of water and 3 cups coffee daily.  Wt Readings from Last 3 Encounters:  11/22/18 151 lb 12.8 oz (68.9 kg)  09/26/18 151 lb (68.5 kg)  08/22/18 149 lb 12.8 oz (67.9 kg)   Her blood pressure has been controlled at home, today their BP is BP: 112/64  She does not workout. She denies chest pain, shortness of breath, dizziness.   She is on cholesterol medication (simvastatin 40 mg daily, recently changed) and denies myalgias. Her cholesterol is not at goal. The cholesterol last visit was:   Lab Results  Component Value Date   CHOL 170 08/22/2018   HDL 44 (L) 08/22/2018   LDLCALC 99 08/22/2018   TRIG 161 (H) 08/22/2018   CHOLHDL 3.9 08/22/2018    She has been working on diet and exercise for T2 diabetes treated by metformin 1000 mg BID, and denies increased appetite, nausea, paresthesia of the feet,  polydipsia, polyuria, visual disturbances, vomiting and weight loss. Last A1C in the office was:  Lab Results  Component Value Date   HGBA1C 6.5 (H) 08/22/2018   Patient is on Vitamin D supplement and at goal of 70 at last check:    Lab Results  Component Value Date   VD25OH 72 08/22/2018         Current Medications:  Current Outpatient Medications on File Prior to Visit  Medication Sig  . aspirin 81 MG chewable tablet Chew by mouth daily.  Marland Kitchen BAYER MICROLET LANCETS lancets Use as instructed  . bisoprolol-hydrochlorothiazide (ZIAC) 5-6.25 MG tablet Take 1 tablet by mouth daily.  . Blood Glucose Monitoring Suppl (CONTOUR BLOOD GLUCOSE SYSTEM) DEVI Test Blood sugar once daily  . cetirizine (ZYRTEC) 10 MG tablet Take 10 mg by mouth daily.  . Cholecalciferol (VITAMIN D PO) Take 6,000 Int'l Units by mouth daily.   . ferrous sulfate dried (SLOW FE) 160 (50 FE) MG TBCR SR tablet Take 160 mg by mouth daily.  Marland Kitchen glucose blood (BAYER CONTOUR TEST) test strip Use as instructed  . hydrochlorothiazide (HYDRODIURIL) 25 MG tablet Take 1/2-1 tab daily or as directed for BP goal <130/80.  . Magnesium 500 MG CAPS Take 500 mg by mouth 3 (three) times daily.   . meclizine (ANTIVERT) 25 MG tablet Take 1 tablet 3 x / day as needed for Dizziness  . metFORMIN (GLUCOPHAGE-XR) 500 MG 24 hr tablet TAKE 2 TABLETS (1000 MG    TOTAL) TWO TIMES A DAY  . montelukast (SINGULAIR) 10 MG tablet TAKE 1 TABLET DAILY FOR    ALLERGIES  . olmesartan (BENICAR) 40 MG tablet TAKE 1 TABLET BY MOUTH EVERY DAY  . OVER THE COUNTER MEDICATION Nasogel 2 squirts each nostril BID PRN.  Marland Kitchen phentermine (ADIPEX-P) 37.5 MG tablet TAKE 1/2 TO 1 (ONE-HALF TO ONE) TABLET BY MOUTH ONCE DAILY IN THE MORNING FOR DIETING AND WEIGHT LOSS  . promethazine (PHENERGAN) 25 MG tablet Take 1 tablet every 4 hours if needed for nausea or dizziness  . simvastatin (ZOCOR) 40 MG tablet Take 1/2-1 tab daily or as directed in the evening for cholesterol.   No current facility-administered medications on file prior to visit.      Allergies:  Allergies  Allergen Reactions  . Penicillins Other (See Comments)  . Lisinopril Cough  . Topamax [Topiramate] Other (See Comments)    Affected mood, lathargic     Medical History:  Past Medical History:   Diagnosis Date  . Allergy   . Hyperlipidemia   . Hypertension   . Iron deficiency   . Meniere's disease   . Neuropathy   . Type II or unspecified type diabetes mellitus without mention of complication, not stated as uncontrolled   . Vitamin D deficiency    Family history- Reviewed and unchanged Social history- Reviewed and unchanged   Review of Systems:  Review of Systems  Constitutional: Negative for malaise/fatigue and weight loss.  HENT: Negative for hearing loss and tinnitus.   Eyes: Negative for blurred vision and double vision.  Respiratory: Negative for cough, shortness of breath and wheezing.   Cardiovascular: Negative for chest pain, palpitations, orthopnea, claudication and leg swelling.  Gastrointestinal: Negative for abdominal pain, blood in stool, constipation, diarrhea, heartburn, melena, nausea and vomiting.  Genitourinary: Negative.   Musculoskeletal: Negative for joint pain and myalgias.  Skin: Negative for rash.  Neurological: Negative for dizziness, tingling, sensory change, weakness and headaches.  Endo/Heme/Allergies: Negative for polydipsia.  Psychiatric/Behavioral: Negative.  All other systems reviewed and are negative.     Physical Exam: BP 112/64   Pulse 87   Temp 97.7 F (36.5 C)   Ht 4' 11.75" (1.518 m)   Wt 151 lb 12.8 oz (68.9 kg)   SpO2 97%   BMI 29.90 kg/m  Wt Readings from Last 3 Encounters:  11/22/18 151 lb 12.8 oz (68.9 kg)  09/26/18 151 lb (68.5 kg)  08/22/18 149 lb 12.8 oz (67.9 kg)   General Appearance: Well nourished, in no apparent distress. Eyes: PERRLA, EOMs, conjunctiva no swelling or erythema Sinuses: No Frontal/maxillary tenderness ENT/Mouth: Ext aud canals clear, TMs without erythema, bulging. No erythema, swelling, or exudate on post pharynx.  Tonsils not swollen or erythematous. Hearing normal.  Neck: Supple, thyroid normal.  Respiratory: Respiratory effort normal, BS equal bilaterally without rales, rhonchi,  wheezing or stridor.  Cardio: RRR with no MRGs. Brisk peripheral pulses without edema.  Abdomen: Soft, + BS.  Non tender, no guarding, rebound, hernias, masses. Lymphatics: Non tender without lymphadenopathy.  Musculoskeletal: Full ROM, 5/5 strength, Normal gait Skin: Warm, dry without rashes, lesions, ecchymosis.  Neuro: Cranial nerves intact. No cerebellar symptoms.  Psych: Awake and oriented X 3, normal affect, Insight and Judgment appropriate.    Izora Ribas, NP 9:42 AM [facility]

## 2018-11-22 ENCOUNTER — Ambulatory Visit (INDEPENDENT_AMBULATORY_CARE_PROVIDER_SITE_OTHER): Payer: Medicare HMO | Admitting: Adult Health

## 2018-11-22 ENCOUNTER — Encounter: Payer: Self-pay | Admitting: Adult Health

## 2018-11-22 VITALS — BP 112/64 | HR 87 | Temp 97.7°F | Ht 59.75 in | Wt 151.8 lb

## 2018-11-22 DIAGNOSIS — Z79899 Other long term (current) drug therapy: Secondary | ICD-10-CM | POA: Diagnosis not present

## 2018-11-22 DIAGNOSIS — E559 Vitamin D deficiency, unspecified: Secondary | ICD-10-CM

## 2018-11-22 DIAGNOSIS — N182 Chronic kidney disease, stage 2 (mild): Secondary | ICD-10-CM

## 2018-11-22 DIAGNOSIS — E1022 Type 1 diabetes mellitus with diabetic chronic kidney disease: Secondary | ICD-10-CM

## 2018-11-22 DIAGNOSIS — E785 Hyperlipidemia, unspecified: Secondary | ICD-10-CM

## 2018-11-22 DIAGNOSIS — I10 Essential (primary) hypertension: Secondary | ICD-10-CM | POA: Diagnosis not present

## 2018-11-22 DIAGNOSIS — K760 Fatty (change of) liver, not elsewhere classified: Secondary | ICD-10-CM

## 2018-11-22 DIAGNOSIS — E1140 Type 2 diabetes mellitus with diabetic neuropathy, unspecified: Secondary | ICD-10-CM | POA: Diagnosis not present

## 2018-11-22 DIAGNOSIS — E669 Obesity, unspecified: Secondary | ICD-10-CM

## 2018-11-22 DIAGNOSIS — E1169 Type 2 diabetes mellitus with other specified complication: Secondary | ICD-10-CM | POA: Diagnosis not present

## 2018-11-22 DIAGNOSIS — E66811 Obesity, class 1: Secondary | ICD-10-CM

## 2018-11-22 NOTE — Patient Instructions (Addendum)
Goals    . Blood Pressure < 130/80    . DIET - INCREASE WATER INTAKE     Try to get in 4-5 bottles of water daily     . Exercise 150 min/wk Moderate Activity    . LDL CALC < 70    . Weight (lb) < 145 lb (65.8 kg)      Check all meds on goodRx  Call when you need phentermine or olmesartan refilled - let us know which [facility] to send into    Focus on high protein and high fiber foods   Try Dave's killer bread (thin slice - green tag color)  [employer] brand Xtreme wellness high fiber low carb wrap   Try hummus instead of mayo on sandwhich or wrap with fresh veggies        When it comes to diets, agreement about the perfect plan isn't easy to find, even among the experts. Experts at the [employer] developed an idea known as the Healthy Eating Plate. Just imagine a plate divided into logical, healthy portions.  The emphasis is on diet quality:  Load up on vegetables and fruits - one-half of your plate: Aim for color and variety, and remember that potatoes don't count.  Go for whole grains - one-quarter of your plate: Whole wheat, barley, wheat berries, quinoa, oats, brown rice, and foods made with them. If you want pasta, go with whole wheat pasta.  Protein power - one-quarter of your plate: Fish, chicken, beans, and nuts are all healthy, versatile protein sources. Limit red meat.  The diet, however, does go beyond the plate, offering a few other suggestions.  Use healthy plant oils, such as olive, canola, soy, corn, sunflower and peanut. Check the labels, and avoid partially hydrogenated oil, which have unhealthy trans fats.  If you're thirsty, drink water. Coffee and tea are good in moderation, but skip sugary drinks and limit milk and dairy products to one or two daily servings.  The type of carbohydrate in the diet is more important than the amount. Some sources of carbohydrates, such as vegetables, fruits, whole grains, and beans-are healthier than  others.  Finally, stay active.

## 2018-11-23 LAB — HEMOGLOBIN A1C
Hgb A1c MFr Bld: 6.6 % of total Hgb — ABNORMAL HIGH (ref <5.7)
Mean Plasma Glucose: 143 (calc)
eAG (mmol/L): 7.9 (calc)

## 2018-11-23 LAB — LIPID PANEL
Cholesterol: 164 mg/dL (ref <200)
HDL: 44 mg/dL — ABNORMAL LOW (ref >=50)
LDL Cholesterol (Calc): 92 mg/dL (calc)
Non-HDL Cholesterol (Calc): 120 mg/dL (calc) (ref <130)
Total CHOL/HDL Ratio: 3.7 (calc) (ref <5.0)
Triglycerides: 188 mg/dL — ABNORMAL HIGH (ref <150)

## 2018-11-23 LAB — TSH: TSH: 2.65 mIU/L (ref 0.40–4.50)

## 2018-11-23 LAB — CBC WITH DIFFERENTIAL/PLATELET
Absolute Monocytes: 582 cells/uL (ref 200–950)
Basophils Absolute: 58 cells/uL (ref 0–200)
Basophils Relative: 0.6 %
Eosinophils Absolute: 243 cells/uL (ref 15–500)
Eosinophils Relative: 2.5 %
HEMATOCRIT: 37 % (ref 35.0–45.0)
Hemoglobin: 12.3 g/dL (ref 11.7–15.5)
Lymphs Abs: 3211 cells/uL (ref 850–3900)
MCH: 27 pg (ref 27.0–33.0)
MCHC: 33.2 g/dL (ref 32.0–36.0)
MCV: 81.1 fL (ref 80.0–100.0)
MPV: 10.2 fL (ref 7.5–12.5)
Monocytes Relative: 6 %
Neutro Abs: 5607 cells/uL (ref 1500–7800)
Neutrophils Relative %: 57.8 %
Platelets: 309 10*3/uL (ref 140–400)
RBC: 4.56 10*6/uL (ref 3.80–5.10)
RDW: 13.5 % (ref 11.0–15.0)
Total Lymphocyte: 33.1 %
WBC: 9.7 10*3/uL (ref 3.8–10.8)

## 2018-11-23 LAB — COMPLETE METABOLIC PANEL WITH GFR
AG Ratio: 1.5 (calc) (ref 1.0–2.5)
ALKALINE PHOSPHATASE (APISO): 82 U/L (ref 37–153)
ALT: 17 U/L (ref 6–29)
AST: 18 U/L (ref 10–35)
Albumin: 4.3 g/dL (ref 3.6–5.1)
BUN: 11 mg/dL (ref 7–25)
CO2: 28 mmol/L (ref 20–32)
Calcium: 10.1 mg/dL (ref 8.6–10.4)
Chloride: 97 mmol/L — ABNORMAL LOW (ref 98–110)
Creat: 0.73 mg/dL (ref 0.50–0.99)
GFR, Est African American: 97 mL/min/{1.73_m2} (ref >=60)
GFR, Est Non African American: 84 mL/min/{1.73_m2} (ref >=60)
Globulin: 2.8 g/dL (calc) (ref 1.9–3.7)
Glucose, Bld: 132 mg/dL — ABNORMAL HIGH (ref 65–99)
Potassium: 4.2 mmol/L (ref 3.5–5.3)
Sodium: 135 mmol/L (ref 135–146)
Total Bilirubin: 0.4 mg/dL (ref 0.2–1.2)
Total Protein: 7.1 g/dL (ref 6.1–8.1)

## 2018-11-23 LAB — MAGNESIUM: Magnesium: 1.7 mg/dL (ref 1.5–2.5)

## 2018-12-19 ENCOUNTER — Other Ambulatory Visit: Payer: Self-pay

## 2018-12-19 DIAGNOSIS — I10 Essential (primary) hypertension: Secondary | ICD-10-CM

## 2018-12-19 MED ORDER — OLMESARTAN MEDOXOMIL 40 MG PO TABS: ORAL_TABLET | ORAL | 11 refills | Status: DC

## 2018-12-19 NOTE — Telephone Encounter (Signed)
Refill request for Olmesartan

## 2019-02-01 ENCOUNTER — Other Ambulatory Visit: Payer: Self-pay | Admitting: Physician Assistant

## 2019-02-21 ENCOUNTER — Encounter: Payer: Self-pay | Admitting: Internal Medicine

## 2019-02-21 NOTE — Progress Notes (Signed)
THIS ENCOUNTER IS A VIRTUAL VISIT DUE TO COVID-19 - PATIENT WAS NOT SEEN IN THE OFFICE.  PATIENT HAS CONSENTED TO VIRTUAL VISIT / TELEMEDICINE VISIT  This provider placed a call to Regina Cervantes using telephone, her appointment was changed to a virtual office visit to reduce the risk of exposure to the COVID-19 virus and to help Regina Cervantes remain healthy and safe. The virtual visit will also provide continuity of care. She verbalizes understanding.   Virtual Visit via telephone Note  I connected with  Regina Cervantes  on 02/24/19  by telephone.  I verified that I am speaking with the correct person using two identifiers.        I discussed the limitations of evaluation and management by telemedicine and the availability of in person appointments. The patient expressed understanding and agreed to proceed.  History of Present Illness:      This very nice 68 y.o.female MWF presents for 6 month follow up with HTN, HLD, T2_NIDDM and Vitamin D Deficiency.       Patient is treated for HTN (2004) & BP has been controlled at home. Today's BP is at goal - 114/79. Patient has had no complaints of any cardiac type chest pain, palpitations, dyspnea / orthopnea / PND, dizziness, claudication, or dependent edema.      Hyperlipidemia is controlled with diet & Simvastatin. Patient denies myalgias or other med SE's. Last Lipids were at goal albeit sl elevated Trig"s: Lab Results  Component Value Date   CHOL 164 11/22/2018   HDL 44 (L) 11/22/2018   LDLCALC 92 11/22/2018   TRIG 188 (H) 11/22/2018   CHOLHDL 3.7 11/22/2018       Also, the patient has Moderate Obesity (BMI 29.9) and history of T2_NIDDM (2004) and CKD2 (GFR 84) and has had no symptoms of reactive hypoglycemia, diabetic polys, paresthesias or visual blurring.  Last A1c was not at goal: Lab Results  Component Value Date   HGBA1C 6.6 (H) 11/22/2018      Further, the patient also has history of Vitamin D Deficiency ("37" / 2014)    and supplements vitamin D without any suspected side-effects. Last vitamin D was at goal: Lab Results  Component Value Date   VD25OH 72 08/22/2018   Current Outpatient Medications on File Prior to Visit  Medication Sig  . aspirin 81 MG chewable tablet Chew by mouth daily.  Marland Kitchen BAYER MICROLET LANCETS lancets Use as instructed  . bisoprolol-hydrochlorothiazide (ZIAC) 5-6.25 MG tablet TAKE 1 TABLET DAILY  . Blood Glucose Monitoring Suppl (CONTOUR BLOOD GLUCOSE SYSTEM) DEVI Test Blood sugar once daily  . cetirizine (ZYRTEC) 10 MG tablet Take 10 mg by mouth daily.  . Cholecalciferol (VITAMIN D PO) Take 6,000 Int'l Units by mouth daily.   . ferrous sulfate dried (SLOW FE) 160 (50 FE) MG TBCR SR tablet Take 160 mg by mouth daily.  Marland Kitchen glucose blood (BAYER CONTOUR TEST) test strip Use as instructed  . hydrochlorothiazide (HYDRODIURIL) 25 MG tablet TAKE 1 TABLET DAILY FOR    BLOOD PRESSURE AND FLUID  . Magnesium 500 MG CAPS Take 500 mg by mouth 3 (three) times daily.   . meclizine (ANTIVERT) 25 MG tablet Take 1 tablet 3 x / day as needed for Dizziness  . metFORMIN (GLUCOPHAGE-XR) 500 MG 24 hr tablet TAKE 2 TABLETS (1000 MG    TOTAL) TWO TIMES A DAY  . montelukast (SINGULAIR) 10 MG tablet TAKE 1 TABLET DAILY FOR    ALLERGIES  . olmesartan (BENICAR)  40 MG tablet TAKE 1 TABLET BY MOUTH EVERY DAY  . OVER THE COUNTER MEDICATION Nasogel 2 squirts each nostril BID PRN.  Marland Kitchen phentermine (ADIPEX-P) 37.5 MG tablet TAKE 1/2 TO 1 (ONE-HALF TO ONE) TABLET BY MOUTH ONCE DAILY IN THE MORNING FOR DIETING AND WEIGHT LOSS  . promethazine (PHENERGAN) 25 MG tablet Take 1 tablet every 4 hours if needed for nausea or dizziness  . simvastatin (ZOCOR) 40 MG tablet Take 1/2-1 tab daily or as directed in the evening for cholesterol.   No current facility-administered medications on file prior to visit.    Allergies  Allergen Reactions  . Penicillins Other (See Comments)  . Lisinopril Cough  . Topamax [Topiramate] Other (See  Comments)    Affected mood, lathargic   PMHx:   Past Medical History:  Diagnosis Date  . Allergy   . Hyperlipidemia   . Hypertension   . Iron deficiency   . Meniere's disease   . Neuropathy   . Type II or unspecified type diabetes mellitus without mention of complication, not stated as uncontrolled   . Vitamin D deficiency    Immunization History  Administered Date(s) Administered  . Influenza Split 07/16/2013, 07/24/2014  . Influenza, High Dose Seasonal PF 06/15/2015, 07/04/2016, 07/14/2017, 06/19/2018  . PPD Test 04/14/2014  . Pneumococcal Conjugate-13 10/31/2014  . Pneumococcal Polysaccharide-23 12/01/2004, 03/23/2016  . Tdap 04/06/2011  . Zoster 07/16/2013   Past Surgical History:  Procedure Laterality Date  . ABDOMINAL HYSTERECTOMY  1998   BSO  . ORIF ANKLE FRACTURE Left 2000  . SHOULDER SURGERY Right 02/2017   rotator cuff repair Dr. Augustin Coupe   FHx:    Reviewed / unchanged  SHx:    Reviewed / unchanged   Systems Review:  Constitutional: Denies fever, chills, wt changes, headaches, insomnia, fatigue, night sweats, change in appetite. Eyes: Denies redness, blurred vision, diplopia, discharge, itchy, watery eyes.  ENT: Denies discharge, congestion, post nasal drip, epistaxis, sore throat, earache, hearing loss, dental pain, tinnitus, vertigo, sinus pain, snoring.  CV: Denies chest pain, palpitations, irregular heartbeat, syncope, dyspnea, diaphoresis, orthopnea, PND, claudication or edema. Respiratory: denies cough, dyspnea, DOE, pleurisy, hoarseness, laryngitis, wheezing.  Gastrointestinal: Denies dysphagia, odynophagia, heartburn, reflux, water brash, abdominal pain or cramps, nausea, vomiting, bloating, diarrhea, constipation, hematemesis, melena, hematochezia  or hemorrhoids. Genitourinary: Denies dysuria, frequency, urgency, nocturia, hesitancy, discharge, hematuria or flank pain. Musculoskeletal: Denies arthralgias, myalgias, stiffness, jt. swelling, pain,  limping or strain/sprain.  Skin: Denies pruritus, rash, hives, warts, acne, eczema or change in skin lesion(s). Neuro: No weakness, tremor, incoordination, spasms, paresthesia or pain. Psychiatric: Denies confusion, memory loss or sensory loss. Endo: Denies change in weight, skin or hair change.  Heme/Lymph: No excessive bleeding, bruising or enlarged lymph nodes.  Physical Exam  BP 114/79   P 90   T 96.6 F   R 14   Ht 4' 11.75"    Wt 149 lb 12.8 oz    SpO2 98%   BMI 29.50    General : Well sounding patient in no apparent distress HEENT: no hoarseness, no cough for duration of visit Lungs: speaks in complete sentences, no audible wheezing, no apparent distress Neurological: alert, oriented x 3 Psychiatric: pleasant, judgement appropriate   Assessment and Plan:  1. Essential hypertension  - Continue medication, monitor blood pressure at home.  - Continue DASH diet.  Reminder to go to the ER if any CP,  SOB, nausea, dizziness, severe HA, changes vision/speech.  2. Hyperlipidemia, mixed  - Continue diet/meds, exercise,&  lifestyle modifications.  - Continue monitor periodic cholesterol/liver & renal functions   3. Type 2 diabetes mellitus with stage 2 chronic kidney disease, without long-term current use of insulin (HCC)  - Continue diet, exercise  - Lifestyle modifications.  - Monitor appropriate labs.  4. Vitamin D deficiency  - Continue supplementation.         Discussed  regular exercise, BP monitoring, weight control to achieve/maintain BMI less than 25 and discussed med and SE's. Recommended labs to assess and monitor clinical status with further disposition pending results of labs. I discussed the assessment and treatment plan with the patient. The patient was provided an opportunity to ask questions and all were answered. The patient agreed with the plan and demonstrated an understanding of the instructions. I provided  minutes of non-face-to-face time during this  encounter and over 40 minutes of exam, counseling, chart review and  complex critical decision making was performed  Kirtland Bouchard, MD

## 2019-02-21 NOTE — Patient Instructions (Signed)

## 2019-02-22 ENCOUNTER — Ambulatory Visit: Payer: Self-pay | Admitting: Internal Medicine

## 2019-02-22 ENCOUNTER — Ambulatory Visit: Payer: Medicare HMO | Admitting: Internal Medicine

## 2019-02-22 VITALS — BP 114/79 | HR 90 | Temp 96.6°F | Resp 14 | Ht 59.75 in | Wt 149.8 lb

## 2019-02-22 DIAGNOSIS — E559 Vitamin D deficiency, unspecified: Secondary | ICD-10-CM

## 2019-02-22 DIAGNOSIS — I10 Essential (primary) hypertension: Secondary | ICD-10-CM | POA: Diagnosis not present

## 2019-02-22 DIAGNOSIS — Z79899 Other long term (current) drug therapy: Secondary | ICD-10-CM | POA: Diagnosis not present

## 2019-02-22 DIAGNOSIS — E782 Mixed hyperlipidemia: Secondary | ICD-10-CM

## 2019-02-22 DIAGNOSIS — E1122 Type 2 diabetes mellitus with diabetic chronic kidney disease: Secondary | ICD-10-CM

## 2019-02-22 DIAGNOSIS — N182 Chronic kidney disease, stage 2 (mild): Secondary | ICD-10-CM | POA: Diagnosis not present

## 2019-02-24 ENCOUNTER — Encounter: Payer: Self-pay | Admitting: Internal Medicine

## 2019-02-25 ENCOUNTER — Other Ambulatory Visit: Payer: Self-pay

## 2019-03-02 ENCOUNTER — Other Ambulatory Visit: Payer: Self-pay | Admitting: Internal Medicine

## 2019-03-08 ENCOUNTER — Encounter: Payer: Self-pay | Admitting: Gastroenterology

## 2019-03-14 ENCOUNTER — Other Ambulatory Visit: Payer: Self-pay | Admitting: Internal Medicine

## 2019-05-01 ENCOUNTER — Other Ambulatory Visit: Payer: Self-pay | Admitting: Adult Health

## 2019-05-30 ENCOUNTER — Encounter: Payer: Self-pay | Admitting: Adult Health

## 2019-05-30 DIAGNOSIS — Z9109 Other allergy status, other than to drugs and biological substances: Secondary | ICD-10-CM | POA: Insufficient documentation

## 2019-05-30 NOTE — Progress Notes (Signed)
MEDICARE ANNUAL WELLNESS VISIT AND FOLLOW UP  Assessment:    Medicare annual wellness visit  Essential hypertension - continue medications, DASH diet, exercise and monitor at home. Call if greater than 130/80.  - CBC with Differential/Platelet - CMP/GFR - TSH  Type 2 diabetes mellitus with sensory neuropathy ([place]) Discussed general issues about diabetes pathophysiology and management., Educational material distributed., Suggested low cholesterol diet., Encouraged aerobic exercise., Discussed foot care., Reminded to get yearly retinal exam.- report requested - Hemoglobin A1c  Obesity with co morbidities - long discussion about weight loss, diet, and exercise - Lipid panel - Hemoglobin A1c  Hyperlipidemia -continue medications, check lipids, decrease fatty foods, increase activity.  - LDL goal <70; consider switch agents if remains above goal  - Lipid panel  Vitamin D deficiency Continue supplement, check at CPE   Medication management - Magnesium  Meniere's disease, unspecified laterality Continue HCTZ, monitor kidney function Follow up Dr. Lucia Gaskins  Fatty liver Weight loss advised, avoid alcohol/tylenol, will monitor LFTs  Environmental allegies Continue OTC antihistamine, singulair Hygiene reviewed  Situational depression Declines meds, lifestyle reviewed; she plans to pursue counseling CBT recommended F/u if not improving  Screening colonoscopy Colonoscopy- patient declines a colonoscopy even though the risks and benefits were discussed at length. Colon cancer is 3rd most diagnosed cancer and 2nd leading cause of death in both men and women 48 years of age and older. Patient understands the risk of cancer and death with declining the test however they are willing to do cologuard screening instead. They understand that this is not as sensitive or specific as a colonoscopy and they are still recommended to get a colonoscopy. The cologuard will be sent out to their  house.    Over 30 minutes of exam, counseling, chart review, and critical decision making was performed  Future Appointments  Date Time Provider [place]  09/06/2019  9:00 AM [name], MD [facility] None    Plan:   During the course of the visit the patient was educated and counseled about appropriate screening and preventive services including:    Pneumococcal vaccine   Influenza vaccine  Td vaccine  Prevnar 13  Screening electrocardiogram  Screening mammography  Bone densitometry screening  Colorectal cancer screening  Diabetes screening  Glaucoma screening  Nutrition counseling   Advanced directives: given info/requested copies   Subjective:   Regina Cervantes is a 68 y.o. female who presents for Medicare Annual Wellness Visit and 3 month follow up on hypertension, diabetes with DM neuropathy, hyperlipidemia, vitamin D def.   She reports she has a lot going on in the family, sister was diagnosed with kidney cancer, son going through divorce, feeling down/depressed. PHQ-2/9 score of 9. She declines medication, plans to pursue counseling. Denies SI/HI.  She had a AHI of 4.4 with sleep study in 2015.   She has seen Dr. Lucia Gaskins for her menere's, she is back on her HCTZ.  she is prescribed phentermine for weight loss, has taken breaks, taking every other day recently.  While on the medication they have lost 9 lbs since last visit. They deny palpitations, anxiety, trouble sleeping, elevated BP.   BMI is Body mass index is 28.28 kg/m., she is working on diet and exercise. Wt Readings from Last 3 Encounters:  06/03/19 140 lb (63.5 kg)  02/24/19 149 lb 12.8 oz (67.9 kg)  11/22/18 151 lb 12.8 oz (68.9 kg)   Her blood pressure has been controlled at home, today their BP is BP: 106/62  She does  not workout regularly, walks 1-2 x a week for 15-20 mins. She denies chest pain, shortness of breath, dizziness.    She is on cholesterol medication  (simvastatin 40 mg daily) and denies myalgias. Her cholesterol is at goal. The cholesterol last visit was:   Lab Results  Component Value Date   CHOL 164 11/22/2018   HDL 44 (L) 11/22/2018   LDLCALC 92 11/22/2018   TRIG 188 (H) 11/22/2018   CHOLHDL 3.7 11/22/2018    She was first diagnosed with DM 10-15 years ago, has been working on diet and exercise for diabetes with DM neuropathy, she has done a great job bringing down her A1C with weight loss/phentermine, she is on metformin, bASA, ACE, and denies polydipsia, polyuria and visual disturbances. She does check fasting sugars occasionally, ranging 99-128. Last A1C in the office was:  Lab Results  Component Value Date   HGBA1C 6.6 (H) 11/22/2018   Patient is on Vitamin D supplement.   Lab Results  Component Value Date   VD25OH 72 08/22/2018     Lab Results  Component Value Date   GFRNONAA 84 11/22/2018     Medication Review Current Outpatient Medications on File Prior to Visit  Medication Sig  . aspirin 81 MG chewable tablet Chew by mouth daily.  Marland Kitchen BAYER MICROLET LANCETS lancets Use as instructed  . bisoprolol-hydrochlorothiazide (ZIAC) 5-6.25 MG tablet TAKE 1 TABLET DAILY  . Blood Glucose Monitoring Suppl (CONTOUR BLOOD GLUCOSE SYSTEM) DEVI Test Blood sugar once daily  . cetirizine (ZYRTEC) 10 MG tablet Take 10 mg by mouth daily.  . Cholecalciferol (VITAMIN D PO) Take 6,000 Int'l Units by mouth daily.   . ferrous sulfate dried (SLOW FE) 160 (50 FE) MG TBCR SR tablet Take 160 mg by mouth daily.  . hydrochlorothiazide (HYDRODIURIL) 25 MG tablet TAKE 1 TABLET DAILY FOR    BLOOD PRESSURE AND FLUID  . Magnesium 500 MG CAPS Take 500 mg by mouth 3 (three) times daily.   . metFORMIN (GLUCOPHAGE-XR) 500 MG 24 hr tablet Take 2 tablets 2 x /day with meals for Diabetes  . montelukast (SINGULAIR) 10 MG tablet TAKE 1 TABLET DAILY FOR    ALLERGIES  . olmesartan (BENICAR) 40 MG tablet TAKE 1 TABLET BY MOUTH EVERY DAY  . phentermine (ADIPEX-P)  37.5 MG tablet Take 1/2 to 1 tablet every [place] for Dieting & Weight Loss  . simvastatin (ZOCOR) 40 MG tablet Take 1 tablet at Bedtime for Cholesterol   No current facility-administered medications on file prior to visit.     Allergies: Allergies  Allergen Reactions  . Penicillins Other (See Comments)  . Lisinopril Cough  . Topamax [Topiramate] Other (See Comments)    Affected mood, lathargic    Current Problems (verified) has Hyperlipidemia associated with type 2 diabetes mellitus ([place]); Hypertension; Vitamin D deficiency; Meniere's disease; Medication management; Obesity (BMI 30.0-34.9); Type 2 diabetes mellitus with sensory neuropathy ([place]); Fatty liver; CKD stage 2 due to type 1 diabetes mellitus ([place]); Environmental allergies; and Situational depression on their problem list.  Screening Tests Immunization History  Administered Date(s) Administered  . Influenza Split 07/16/2013, 07/24/2014  . Influenza, High Dose Seasonal PF 06/15/2015, 07/04/2016, 07/14/2017, 06/19/2018  . PPD Test 04/14/2014  . Pneumococcal Conjugate-13 10/31/2014  . Pneumococcal Polysaccharide-23 12/01/2004, 03/23/2016  . Tdap 04/06/2011  . Zoster 07/16/2013    Preventative care: Last colonoscopy: 2010, got letter but wants to postpone due to family situation, willing to do cologuard Last mammogram: 07/10/2018 Last pap smear/pelvic exam: remote,  declines another DEXA: 2019 - normal  CXR:11/2010 [place] [facility] 2014  Prior vaccinations: TD or Tdap: 2012  Influenza: 2019, DUE today  Pneumococcal: 2017 Prevnar13: 2016 Shingles/Zostavax: 2014  Names of Other Physician/Practitioners you currently use: 1. [facility]- here for primary care 2. [facility], Dr. Willette Brace, eye doctor, last visit 04/16/2018 - report abstracted - has scheduled Oct/2020 3. Dr. Bjorn Pippin, dentist, last visit 2020  Patient Care Team: [name], MD as PCP - General (Internal  Medicine) Inda Castle, MD (Inactive) as Consulting Physician (Gastroenterology) Rozetta Nunnery, MD as Consulting Physician (Otolaryngology) Rona Ravens, [place] (Optometry) Justice Britain, MD as Consulting Physician (Orthopedic Surgery)  Surgical: She  has a past surgical history that includes ORIF ankle fracture (Left, 2000); Abdominal hysterectomy (1998); and Shoulder surgery (Right, 02/2017). Family Her family history includes Autism in her brother; Breast cancer in her paternal aunt; COPD in her brother; Diabetes in her father; Heart disease in her father and son; Hypertension in her father; Kidney cancer in her sister; Liver cancer in her maternal grandmother; Lung cancer in her brother; Pancreatic cancer in her mother. Social history  She reports that she has never smoked. She has never used smokeless tobacco. She reports current alcohol use. She reports that she does not use drugs.  MEDICARE WELLNESS OBJECTIVES: Physical activity: Current Exercise Habits: The patient does not participate in regular exercise at present, Exercise limited by: Other - see comments(family situation) Cardiac risk factors: Cardiac Risk Factors include: advanced age (>96men, >10 women);diabetes mellitus;dyslipidemia;hypertension;sedentary lifestyle Depression/mood screen:   Depression screen [facility] 2/9 06/03/2019  Decreased Interest 1  Down, Depressed, Hopeless 1  PHQ - 2 Score 2  Altered sleeping 1  Tired, decreased energy 2  Change in appetite 1  Feeling bad or failure about yourself  2  Trouble concentrating 1  Moving slowly or fidgety/restless 0  Suicidal thoughts 0  PHQ-9 Score 9  Difficult doing work/chores Somewhat difficult    ADLs:  In your present state of health, do you have any difficulty performing the following activities: 06/03/2019 02/21/2019  Hearing? N N  Comment decreased in L from Meniere's; compensates well without difficulty -  Vision? N N  Difficulty concentrating or making  decisions? N N  Walking or climbing stairs? N N  Dressing or bathing? N N  Doing errands, shopping? N N  Some recent data might be hidden     Cognitive Testing  Alert? Yes  Normal Appearance?Yes  Oriented to person? Yes  Place? Yes   Time? Yes  Recall of three objects?  Yes  Can perform simple calculations? Yes  Displays appropriate judgment?Yes  Can read the correct time from a watch face?Yes  EOL planning: Does Patient Have a Medical Advance Directive?: No Would patient like information on creating a medical advance directive?: No - Patient declined(has papers, needs to complete)   Review of Systems  Constitutional: Negative.   HENT: Negative.   Eyes: Negative.   Respiratory: Negative.   Cardiovascular: Negative.   Gastrointestinal: Negative.   Genitourinary: Negative.   Musculoskeletal: Negative.   Skin: Negative.   Neurological: Positive for tingling (bilateral toes).  Endo/Heme/Allergies: Negative.   Psychiatric/Behavioral: Negative.     Objective:   Today's Vitals   06/03/19 0852  BP: 106/62  Pulse: 91  Temp: (!) 97.3 F (36.3 C)  SpO2: 99%  Weight: 140 lb (63.5 kg)  Height: 4\' 11"  (1.499 m)   Body mass index is 28.28 kg/m.  General  appearance: alert, no distress, WD/WN,  female HEENT: normocephalic, sclerae anicteric, TMs pearly, nares patent, no discharge or erythema, pharynx normal Oral cavity: MMM, no lesions Neck: supple, no lymphadenopathy, no thyromegaly, no masses Heart: RRR, normal S1, S2, no murmurs Lungs: CTA bilaterally, no wheezes, rhonchi, or rales Abdomen: +bs, soft, non tender, non distended, no masses, no hepatomegaly, no splenomegaly Musculoskeletal: nontender, no swelling, no obvious deformity Extremities: no edema, no cyanosis, no clubbing Pulses: 2+ symmetric, upper and lower extremities, normal cap refill Neurological: alert, oriented x 3, CN2-12 intact, strength normal upper extremities and lower extremities, sensation normal  throughout, DTRs 2+ throughout, no cerebellar signs, gait normal Psychiatric: normal affect, behavior normal, pleasant  Breast: defer Gyn: defer Rectal: defer   Medicare Attestation I have personally reviewed: The patient's medical and social history Their use of alcohol, tobacco or illicit drugs Their current medications and supplements The patient's functional ability including ADLs,fall risks, home safety risks, cognitive, and hearing and visual impairment Diet and physical activities Evidence for depression or mood disorders  The patient's weight, height, BMI, and visual acuity have been recorded in the chart.  I have made referrals, counseling, and provided education to the patient based on review of the above and I have provided the patient with a written personalized care plan for preventive services.     Izora Ribas, NP   06/03/2019

## 2019-06-03 ENCOUNTER — Other Ambulatory Visit: Payer: Self-pay

## 2019-06-03 ENCOUNTER — Other Ambulatory Visit: Payer: Self-pay | Admitting: Adult Health

## 2019-06-03 ENCOUNTER — Ambulatory Visit (INDEPENDENT_AMBULATORY_CARE_PROVIDER_SITE_OTHER): Payer: Medicare HMO | Admitting: Adult Health

## 2019-06-03 ENCOUNTER — Encounter: Payer: Self-pay | Admitting: Adult Health

## 2019-06-03 VITALS — BP 106/62 | HR 91 | Temp 97.3°F | Ht 59.0 in | Wt 140.0 lb

## 2019-06-03 DIAGNOSIS — E1140 Type 2 diabetes mellitus with diabetic neuropathy, unspecified: Secondary | ICD-10-CM

## 2019-06-03 DIAGNOSIS — Z9109 Other allergy status, other than to drugs and biological substances: Secondary | ICD-10-CM

## 2019-06-03 DIAGNOSIS — E559 Vitamin D deficiency, unspecified: Secondary | ICD-10-CM

## 2019-06-03 DIAGNOSIS — E785 Hyperlipidemia, unspecified: Secondary | ICD-10-CM | POA: Diagnosis not present

## 2019-06-03 DIAGNOSIS — E1022 Type 1 diabetes mellitus with diabetic chronic kidney disease: Secondary | ICD-10-CM

## 2019-06-03 DIAGNOSIS — E669 Obesity, unspecified: Secondary | ICD-10-CM

## 2019-06-03 DIAGNOSIS — I10 Essential (primary) hypertension: Secondary | ICD-10-CM

## 2019-06-03 DIAGNOSIS — Z79899 Other long term (current) drug therapy: Secondary | ICD-10-CM | POA: Diagnosis not present

## 2019-06-03 DIAGNOSIS — Z23 Encounter for immunization: Secondary | ICD-10-CM

## 2019-06-03 DIAGNOSIS — E1169 Type 2 diabetes mellitus with other specified complication: Secondary | ICD-10-CM | POA: Diagnosis not present

## 2019-06-03 DIAGNOSIS — H8109 Meniere's disease, unspecified ear: Secondary | ICD-10-CM

## 2019-06-03 DIAGNOSIS — R42 Dizziness and giddiness: Secondary | ICD-10-CM

## 2019-06-03 DIAGNOSIS — N182 Chronic kidney disease, stage 2 (mild): Secondary | ICD-10-CM | POA: Diagnosis not present

## 2019-06-03 DIAGNOSIS — R6889 Other general symptoms and signs: Secondary | ICD-10-CM

## 2019-06-03 DIAGNOSIS — Z0001 Encounter for general adult medical examination with abnormal findings: Secondary | ICD-10-CM

## 2019-06-03 DIAGNOSIS — R69 Illness, unspecified: Secondary | ICD-10-CM | POA: Diagnosis not present

## 2019-06-03 DIAGNOSIS — Z0000 Encounter for general adult medical examination without abnormal findings: Secondary | ICD-10-CM

## 2019-06-03 DIAGNOSIS — Z1211 Encounter for screening for malignant neoplasm of colon: Secondary | ICD-10-CM

## 2019-06-03 DIAGNOSIS — F4321 Adjustment disorder with depressed mood: Secondary | ICD-10-CM | POA: Insufficient documentation

## 2019-06-03 DIAGNOSIS — K760 Fatty (change of) liver, not elsewhere classified: Secondary | ICD-10-CM

## 2019-06-03 MED ORDER — GLUCOSE BLOOD VI STRP: ORAL_STRIP | 12 refills | Status: DC

## 2019-06-03 MED ORDER — PROMETHAZINE HCL 25 MG PO TABS: ORAL_TABLET | ORAL | 2 refills | Status: DC

## 2019-06-03 MED ORDER — MECLIZINE HCL 25 MG PO TABS: ORAL_TABLET | ORAL | 2 refills | Status: DC

## 2019-06-03 NOTE — Patient Instructions (Signed)
Regina Cervantes , Thank you for taking time to come for your Medicare Wellness Visit. I appreciate your ongoing commitment to your health goals. Please review the following plan we discussed and let me know if I can assist you in the future.   These are the goals we discussed: Goals    . Blood Pressure < 130/80    . DIET - INCREASE WATER INTAKE     Try to get in 4-5 bottles of water daily     . Exercise 150 min/wk Moderate Activity    . LDL CALC < 70    . Weight (lb) < 145 lb (65.8 kg)       This is a list of the screening recommended for you and due dates:  Health Maintenance  Topic Date Due  . Colon Cancer Screening  03/28/2019  . Eye exam for diabetics  04/17/2019  . Flu Shot  04/20/2019  . Hemoglobin A1C  05/25/2019  . Complete foot exam   08/23/2019  . Mammogram  07/10/2020  . Tetanus Vaccine  04/05/2021  . DEXA scan (bone density measurement)  Completed  .  Hepatitis C: One time screening is recommended by Center for Disease Control  (CDC) for  adults born from [birth year] through 1967.   Completed  . Pneumonia vaccines  Completed        Dysphoria Dysphoria is an emotion in which a person feels unpleasant or uncomfortable. It may involve mood changes and feelings of sadness, anxiety, irritability, and restlessness. Dysphoria is often caused by normal life stress and usually goes away within several days. Dysphoria that lasts longer than several days may be a symptom of a mental illness, such as major depression or bipolar disorder. Follow these instructions at home:     Monitor your mood for any changes. Take these steps to help with discomfort and unpleasant feelings: Lifestyle  Maintain a healthy lifestyle. ? Eat a healthy diet that includes mood boosters such as nuts, fatty fish, fruits, and vegetables. ? Exercise regularly. Aim for about 20 minutes a day of physical activity. ? Get enough sleep. Try to sleep at least 8 hours every night.  Avoid alcohol and drugs.   Learn ways to reduce stress and cope with stress, such as with yoga and meditation.  Make time to do things that you enjoy.  Do not use any products that contain nicotine or tobacco, such as cigarettes and e-cigarettes. If you need help quitting, ask your health care provider. General instructions  Take over-the-counter and prescription medicines only as told by your health care provider.  Check with your health care provider before taking any herbs or supplements.  Talk about your feelings with family members or health care providers.  Keep all follow-up visits as told by your health care provider.This is important. Contact a health care provider if you:  Were given medicine and it does not seem to be helping.  Feel hopeless and overwhelmed.  Feel like you cannot leave your house.  Have trouble taking care of yourself. Get help right away if you:  Have serious thoughts about hurting yourself or others. If you ever feel like you may hurt yourself or others, or have thoughts about taking your own life, get help right away. You can go to your nearest emergency department or call:  Your local emergency services (911 in the [place]  A suicide crisis helpline, such as the [place] at 765-861-5307. This is open 24 hours a day. Summary  Dysphoria is an emotion in which a person feels unpleasant or uncomfortable. It may involve mood changes and feelings of sadness, anxiety, irritability, and restlessness.  Dysphoria that lasts longer than several days may be a symptom of a mental illness, such as major depression or bipolar disorder.  Take steps to boost your mood by eating well and getting enough sleep and exercise. Be sure to see a health care provider if your symptoms worsen or do not go away with treatment. This information is not intended to replace advice given to you by your health care provider. Make sure you discuss any questions you have with your  health care provider. Document Released: 02/14/2006 Document Revised: 08/18/2017 Document Reviewed: 08/17/2017 Elsevier Patient Education  2020 [employer].

## 2019-06-04 ENCOUNTER — Other Ambulatory Visit: Payer: Self-pay | Admitting: Adult Health

## 2019-06-04 LAB — COMPLETE METABOLIC PANEL WITH GFR
AG Ratio: 1.4 (calc) (ref 1.0–2.5)
ALT: 11 U/L (ref 6–29)
AST: 14 U/L (ref 10–35)
Albumin: 4.3 g/dL (ref 3.6–5.1)
Alkaline phosphatase (APISO): 98 U/L (ref 37–153)
BUN: 13 mg/dL (ref 7–25)
CO2: 31 mmol/L (ref 20–32)
Calcium: 10.2 mg/dL (ref 8.6–10.4)
Chloride: 97 mmol/L — ABNORMAL LOW (ref 98–110)
Creat: 0.67 mg/dL (ref 0.50–0.99)
GFR, Est African American: 104 mL/min/{1.73_m2} (ref >=60)
GFR, Est Non African American: 90 mL/min/{1.73_m2} (ref >=60)
Globulin: 3.1 g/dL (calc) (ref 1.9–3.7)
Glucose, Bld: 104 mg/dL — ABNORMAL HIGH (ref 65–99)
Potassium: 3.9 mmol/L (ref 3.5–5.3)
Sodium: 135 mmol/L (ref 135–146)
Total Bilirubin: 0.5 mg/dL (ref 0.2–1.2)
Total Protein: 7.4 g/dL (ref 6.1–8.1)

## 2019-06-04 LAB — CBC WITH DIFFERENTIAL/PLATELET
Absolute Monocytes: 608 cells/uL (ref 200–950)
Basophils Absolute: 49 cells/uL (ref 0–200)
Basophils Relative: 0.5 %
Eosinophils Absolute: 421 cells/uL (ref 15–500)
Eosinophils Relative: 4.3 %
HCT: 37.2 % (ref 35.0–45.0)
Hemoglobin: 12.3 g/dL (ref 11.7–15.5)
Lymphs Abs: 2783 cells/uL (ref 850–3900)
MCH: 26.9 pg — ABNORMAL LOW (ref 27.0–33.0)
MCHC: 33.1 g/dL (ref 32.0–36.0)
MCV: 81.2 fL (ref 80.0–100.0)
MPV: 10.6 fL (ref 7.5–12.5)
Monocytes Relative: 6.2 %
Neutro Abs: 5939 cells/uL (ref 1500–7800)
Neutrophils Relative %: 60.6 %
Platelets: 302 10*3/uL (ref 140–400)
RBC: 4.58 10*6/uL (ref 3.80–5.10)
RDW: 13.5 % (ref 11.0–15.0)
Total Lymphocyte: 28.4 %
WBC: 9.8 10*3/uL (ref 3.8–10.8)

## 2019-06-04 LAB — TSH: TSH: 2.16 mIU/L (ref 0.40–4.50)

## 2019-06-04 LAB — LIPID PANEL
Cholesterol: 151 mg/dL (ref <200)
HDL: 40 mg/dL — ABNORMAL LOW (ref >=50)
LDL Cholesterol (Calc): 84 mg/dL (calc)
Non-HDL Cholesterol (Calc): 111 mg/dL (calc) (ref <130)
Total CHOL/HDL Ratio: 3.8 (calc) (ref <5.0)
Triglycerides: 178 mg/dL — ABNORMAL HIGH (ref <150)

## 2019-06-04 LAB — MAGNESIUM: Magnesium: 1.8 mg/dL (ref 1.5–2.5)

## 2019-06-04 LAB — HEMOGLOBIN A1C
Hgb A1c MFr Bld: 6.3 % of total Hgb — ABNORMAL HIGH (ref <5.7)
Mean Plasma Glucose: 134 (calc)
eAG (mmol/L): 7.4 (calc)

## 2019-06-04 MED ORDER — ROSUVASTATIN CALCIUM 5 MG PO TABS: 5.0000 mg | ORAL_TABLET | Freq: Every day | ORAL | 1 refills | Status: DC

## 2019-06-24 DIAGNOSIS — Z1211 Encounter for screening for malignant neoplasm of colon: Secondary | ICD-10-CM | POA: Diagnosis not present

## 2019-06-24 DIAGNOSIS — Z1212 Encounter for screening for malignant neoplasm of rectum: Secondary | ICD-10-CM | POA: Diagnosis not present

## 2019-06-28 DIAGNOSIS — H5203 Hypermetropia, bilateral: Secondary | ICD-10-CM | POA: Diagnosis not present

## 2019-06-28 DIAGNOSIS — E119 Type 2 diabetes mellitus without complications: Secondary | ICD-10-CM | POA: Diagnosis not present

## 2019-06-28 LAB — HM DIABETES EYE EXAM

## 2019-06-29 LAB — COLOGUARD: Cologuard: NEGATIVE

## 2019-07-01 ENCOUNTER — Encounter: Payer: Self-pay | Admitting: Internal Medicine

## 2019-08-04 ENCOUNTER — Other Ambulatory Visit: Payer: Self-pay | Admitting: Adult Health

## 2019-08-23 ENCOUNTER — Other Ambulatory Visit: Payer: Self-pay

## 2019-08-23 DIAGNOSIS — Z20822 Contact with and (suspected) exposure to covid-19: Secondary | ICD-10-CM

## 2019-08-26 LAB — NOVEL CORONAVIRUS, NAA: SARS-CoV-2, NAA: NOT DETECTED

## 2019-08-31 DIAGNOSIS — R69 Illness, unspecified: Secondary | ICD-10-CM | POA: Diagnosis not present

## 2019-09-04 NOTE — Progress Notes (Signed)
Complete Physical  Assessment and Plan:  Essential hypertension - continue medications, DASH diet, exercise and monitor at home. Call if greater than 130/80.  - CBC with Differential/Platelet - CMP/GFR - TSH - magnesium  - UA/microalbumin - EKG  Type 2 diabetes mellitus with sensory neuropathy ([place]) Discussed general issues about diabetes pathophysiology and management., Educational material distributed., Suggested low cholesterol diet., Encouraged aerobic exercise., Discussed foot care., Reminded to get yearly retinal exam.- report requested - diabetic foot exam  - Hemoglobin A1c  Diabetic sensory neuropathy ([place]) Declines medications at this time, discussed feet checks. Follows with podiatry.   Hyperlipidemia -continue medications, check lipids, decrease fatty foods, increase activity.  - LDL goal <70; discussed increasing dose if remains above goal  - Lipid panel  Vitamin D deficiency Continue supplement, check at CPE   Medication management - Magnesium  Meniere's disease, unspecified laterality Continue HCTZ, monitor kidney function Follow up Dr. Lucia Gaskins  Fatty liver Weight loss advised, avoid alcohol/tylenol, will monitor LFTs  Environmental allegies Continue OTC antihistamine, singulair Hygiene reviewed  Overweight Long discussion about weight loss, diet, and exercise Recommended diet heavy in fruits and veggies and low in animal meats, cheeses, and dairy products, appropriate calorie intake Patient will work on increasing intentional activity Discussed appropriate weight for height  Phentermine - perceives benefit; denies SE; advised to stop for a few weeks for a drug break prior to restarting Follow up at next visit  Situational depression Declines meds, lifestyle reviewed; she plans to pursue counseling  CBT recommended, F/u if not improving, denies SI/HI  Orders Placed This Encounter  Procedures  . CBC with Diff  . COMPLETE METABOLIC PANEL WITH GFR   . Magnesium  . Lipid Profile  . TSH  . Hemoglobin A1c ([facility])  . Vitamin D (25 hydroxy)  . Urinalysis, Routine w reflex microscopic  . Microalbumin / Creatinine Urine Ratio  . EKG 12-Lead    Discussed med's effects and SE's. Screening labs and tests as requested with regular follow-up as recommended. Over 40 minutes of exam, counseling, chart review, and complex, high level critical decision making was performed this visit.   Future Appointments  Date Time Provider [place]  06/08/2020  9:00 AM [name], NP [facility] None  09/07/2020  9:00 AM [name], NP [facility] None     HPI  68 y.o. female  presents for a complete physical and follow up for has Hyperlipidemia associated with type 2 diabetes mellitus ([place]); Hypertension; Vitamin D deficiency; Meniere's disease; Medication management; Overweight (BMI 25.0-29.9); Type 2 diabetes mellitus with sensory neuropathy ([place]); Fatty liver; CKD stage 2 due to type 1 diabetes mellitus ([place]); Environmental allergies; Situational depression; and Neuropathy due to type 2 diabetes mellitus ([place]) on their problem list.   She is married, recent 99 year anniversary, 3 sons, 1 passed away. Retired Web designer. Has grand kids nearby and enjoys spending time with them.   She reports she has a lot going on in the family, sister was diagnosed with kidney cancer (doing better with treatments), son going through divorce, feeling down/depressed. PHQ-2/9 score of 9 at last visit. She declined medication, plans to pursue counseling. Denies SI/HI.  She has seen Dr. Lucia Gaskins for her meniere's, she is back on her HCTZ.  she is prescribed phentermine for weight loss. Taking 1/2 tab only. Plans to take a break soon. While on the medication they have lost 0 lbs since last visit. They deny palpitations, anxiety, trouble sleeping, elevated BP.  BMI is Body mass index  is 29.38 kg/m., she is working on diet, generally active not  intentionally exercise.  Wt Readings from Last 3 Encounters:  09/06/19 143 lb (64.9 kg)  06/03/19 140 lb (63.5 kg)  02/24/19 149 lb 12.8 oz (67.9 kg)   Her blood pressure has been controlled at home, today their BP is BP: 124/74 She does not workout. She denies chest pain, shortness of breath, dizziness.   She is on cholesterol medication (rosuvastatin 5 mg daily) and denies myalgias. Her cholesterol is not at goal. The cholesterol last visit was:   Lab Results  Component Value Date   CHOL 151 06/03/2019   HDL 40 (L) 06/03/2019   LDLCALC 84 06/03/2019   TRIG 178 (H) 06/03/2019   CHOLHDL 3.8 06/03/2019   She has been working on diet and exercise for T2DM with neuroapthy (tingling, uses topical creams, follows with podiatry) and CKD, she is on bASA, she is on ACE/ARB and denies foot ulcerations, hyperglycemia, hypoglycemia, nausea, paresthesia of the feet, polydipsia, polyuria and visual disturbances. Last A1C in the office was:  Lab Results  Component Value Date   HGBA1C 6.3 (H) 06/03/2019   She has CKD I/II associated with T2DM and htn monitored q58m via this office:  Lab Results  Component Value Date   GFRNONAA 90 06/03/2019   Patient is on Vitamin D supplement.   Lab Results  Component Value Date   VD25OH 72 08/22/2018       Current Medications:  Current Outpatient Medications on File Prior to Visit  Medication Sig Dispense Refill  . aspirin 81 MG chewable tablet Chew by mouth daily.    Marland Kitchen BAYER MICROLET LANCETS lancets Use as instructed 100 each 12  . bisoprolol-hydrochlorothiazide (ZIAC) 5-6.25 MG tablet Take 1 tablet Daily for BP 90 tablet 3  . Blood Glucose Monitoring Suppl (CONTOUR BLOOD GLUCOSE SYSTEM) DEVI Test Blood sugar once daily 1 Device 0  . cetirizine (ZYRTEC) 10 MG tablet Take 10 mg by mouth daily.    . Cholecalciferol (VITAMIN D PO) Take 6,000 Int'l Units by mouth daily.     . ferrous sulfate dried (SLOW FE) 160 (50 FE) MG TBCR SR tablet Take 160 mg by  mouth daily.    Marland Kitchen glucose blood (ONETOUCH ULTRA) test strip Check Blood Sugar Daily (Dx: E11.9) 100 each 3  . hydrochlorothiazide (HYDRODIURIL) 25 MG tablet Take 1 tablet Daily for BP, Fluid Retention / Ankle Swelling 90 tablet 3  . Magnesium 500 MG CAPS Take 500 mg by mouth 3 (three) times daily.     . meclizine (ANTIVERT) 25 MG tablet Take 1 tablet 3 x / day as needed for Dizziness 30 tablet 2  . metFORMIN (GLUCOPHAGE-XR) 500 MG 24 hr tablet Take 2 tablets 2 x /day with meals for Diabetes 360 tablet 1  . montelukast (SINGULAIR) 10 MG tablet Take 1 tablet Daily for Allergies 90 tablet 3  . olmesartan (BENICAR) 40 MG tablet TAKE 1 TABLET BY MOUTH EVERY DAY 30 tablet 11  . phentermine (ADIPEX-P) 37.5 MG tablet Take 1/2 to 1 tablet every [place] for Dieting & Weight Loss 90 tablet 1  . rosuvastatin (CRESTOR) 5 MG tablet Take 1 tablet (5 mg total) by mouth daily. 90 tablet 1   No current facility-administered medications on file prior to visit.   Allergies:  Allergies  Allergen Reactions  . Penicillins Other (See Comments)  . Lisinopril Cough  . Topamax [Topiramate] Other (See Comments)    Affected mood, lathargic   Medical History:  She has Hyperlipidemia associated with type 2 diabetes mellitus ([place]); Hypertension; Vitamin D deficiency; Meniere's disease; Medication management; Overweight (BMI 25.0-29.9); Type 2 diabetes mellitus with sensory neuropathy ([place]); Fatty liver; CKD stage 2 due to type 1 diabetes mellitus ([place]); Environmental allergies; Situational depression; and Neuropathy due to type 2 diabetes mellitus ([place]) on their problem list. Health Maintenance:   Immunization History  Administered Date(s) Administered  . Influenza Split 07/16/2013, 07/24/2014  . Influenza, High Dose Seasonal PF 06/15/2015, 07/04/2016, 07/14/2017, 06/19/2018, 06/03/2019  . PPD Test 04/14/2014  . Pneumococcal Conjugate-13 10/31/2014  . Pneumococcal Polysaccharide-23 12/01/2004, 03/23/2016  . Tdap  04/06/2011  . Zoster 07/16/2013   Preventative care: Last colonoscopy: 2010, got letter but wanted to postpone due to family situation Last cologuard: 06/2019 negative Last mammogram: 07/10/2018 - patient will schedule Last pap smear/pelvic exam: remote, declines another DEXA: 2019 - normal  CXR:11/2010 [place] AB 2014  Prior vaccinations: TD or Tdap: 2012  Influenza: 05/2019 Pneumococcal: 2017 Prevnar13: 2016 Shingles/Zostavax: 2014  Names of Other Physician/Practitioners you currently use: 1. [facility]- here for primary care 2. [facility], Dr. Willette Brace, eye doctor, last visit 04/16/2018 - report abstracted - has scheduled Oct/2020 , 3. Dr. Bjorn Pippin, dentist, last visit 2020  Patient Care Team: [name], MD as PCP - General (Internal Medicine) Inda Castle, MD (Inactive) as Consulting Physician (Gastroenterology) Rozetta Nunnery, MD as Consulting Physician (Otolaryngology) Rona Ravens, [place] (Optometry) Justice Britain, MD as Consulting Physician (Orthopedic Surgery) Evelina Bucy, DPM as Consulting Physician (Podiatry)  Surgical History:  She has a past surgical history that includes ORIF ankle fracture (Left, 2000); Abdominal hysterectomy (1998); and Shoulder surgery (Right, 02/2017). Family History:  Herfamily history includes Alcoholism in her mother; Autism in her brother; Breast cancer (age of onset: 9) in her paternal aunt; COPD in her brother; Diabetes in her father; Heart disease in her father and son; Hypertension in her father; Kidney cancer in her sister; Kidney disease in her son; Liver cancer in her maternal grandmother; Lung cancer in her brother; Pancreatic cancer in her mother. Social History:  She reports that she has never smoked. She has never used smokeless tobacco. She reports current alcohol use. She reports that she does not use drugs.  Review of Systems: Review of Systems  Constitutional:  Negative for malaise/fatigue and weight loss.  HENT: Negative for hearing loss and tinnitus.   Eyes: Negative for blurred vision and double vision.  Respiratory: Negative for cough, shortness of breath and wheezing.   Cardiovascular: Negative for chest pain, palpitations, orthopnea, claudication and leg swelling.  Gastrointestinal: Negative for abdominal pain, blood in stool, constipation, diarrhea, heartburn, melena, nausea and vomiting.  Genitourinary: Negative.   Musculoskeletal: Negative for joint pain and myalgias.  Skin: Negative for rash.  Neurological: Negative for dizziness, tingling, sensory change, weakness and headaches.  Endo/Heme/Allergies: Negative for environmental allergies and polydipsia.  Psychiatric/Behavioral: Positive for depression. Negative for hallucinations, memory loss, substance abuse and suicidal ideas. The patient is not nervous/anxious and does not have insomnia.   All other systems reviewed and are negative.   Physical Exam: Estimated body mass index is 29.38 kg/m as calculated from the following:   Height as of this encounter: 4' 10.5" (1.486 m).   Weight as of this encounter: 143 lb (64.9 kg). BP 124/74   Pulse 72   Temp 97.9 F (36.6 C)   Ht 4' 10.5" (1.486 m)   Wt 143 lb (64.9 kg)   SpO2  92%   BMI 29.38 kg/m  General Appearance: Well nourished, in no apparent distress.  Eyes: PERRLA, EOMs, conjunctiva no swelling or erythema Sinuses: No Frontal/maxillary tenderness  ENT/Mouth: Ext aud canals clear, normal light reflex with TMs without erythema, bulging. Good dentition. No erythema, swelling, or exudate on post pharynx. Tonsils not swollen or erythematous. Hearing normal.  Neck: Supple, thyroid normal. No bruits  Respiratory: Respiratory effort normal, BS equal bilaterally without rales, rhonchi, wheezing or stridor.  Cardio: RRR without murmurs, rubs or gallops. Brisk peripheral pulses without edema.  Chest: symmetric, with normal excursions and  percussion.  Breasts: Irregular fibrous texture throughout, without distinct lumps, nipple discharge, retractions.  Abdomen: Soft, nontender, no guarding, rebound, hernias, masses, or organomegaly.  Lymphatics: Non tender without lymphadenopathy.  Genitourinary: Defer Musculoskeletal: Full ROM all peripheral extremities,5/5 strength, and normal gait.  Skin: Warm, dry without rashes, lesions, ecchymosis. Neuro: Cranial nerves intact, reflexes equal bilaterally. Normal muscle tone, no cerebellar symptoms. Sensation intact.  Psych: Awake and oriented X 3, normal affect, Insight and Judgment appropriate.   EKG: WNL no ST changes. Sinus arrhythmia.  Izora Ribas 9:49 AM [facility]

## 2019-09-06 ENCOUNTER — Ambulatory Visit (INDEPENDENT_AMBULATORY_CARE_PROVIDER_SITE_OTHER): Payer: Medicare HMO | Admitting: Adult Health

## 2019-09-06 ENCOUNTER — Encounter: Payer: Self-pay | Admitting: Adult Health

## 2019-09-06 ENCOUNTER — Other Ambulatory Visit: Payer: Self-pay

## 2019-09-06 ENCOUNTER — Encounter: Payer: Self-pay | Admitting: Internal Medicine

## 2019-09-06 VITALS — BP 124/74 | HR 72 | Temp 97.9°F | Ht 58.5 in | Wt 143.0 lb

## 2019-09-06 DIAGNOSIS — Z0001 Encounter for general adult medical examination with abnormal findings: Secondary | ICD-10-CM

## 2019-09-06 DIAGNOSIS — K760 Fatty (change of) liver, not elsewhere classified: Secondary | ICD-10-CM | POA: Diagnosis not present

## 2019-09-06 DIAGNOSIS — E1140 Type 2 diabetes mellitus with diabetic neuropathy, unspecified: Secondary | ICD-10-CM | POA: Diagnosis not present

## 2019-09-06 DIAGNOSIS — E1022 Type 1 diabetes mellitus with diabetic chronic kidney disease: Secondary | ICD-10-CM | POA: Diagnosis not present

## 2019-09-06 DIAGNOSIS — Z79899 Other long term (current) drug therapy: Secondary | ICD-10-CM

## 2019-09-06 DIAGNOSIS — Z0000 Encounter for general adult medical examination without abnormal findings: Secondary | ICD-10-CM

## 2019-09-06 DIAGNOSIS — H8109 Meniere's disease, unspecified ear: Secondary | ICD-10-CM

## 2019-09-06 DIAGNOSIS — D649 Anemia, unspecified: Secondary | ICD-10-CM

## 2019-09-06 DIAGNOSIS — E663 Overweight: Secondary | ICD-10-CM

## 2019-09-06 DIAGNOSIS — E559 Vitamin D deficiency, unspecified: Secondary | ICD-10-CM | POA: Diagnosis not present

## 2019-09-06 DIAGNOSIS — Z9109 Other allergy status, other than to drugs and biological substances: Secondary | ICD-10-CM

## 2019-09-06 DIAGNOSIS — F4321 Adjustment disorder with depressed mood: Secondary | ICD-10-CM

## 2019-09-06 DIAGNOSIS — E785 Hyperlipidemia, unspecified: Secondary | ICD-10-CM | POA: Diagnosis not present

## 2019-09-06 DIAGNOSIS — E539 Vitamin B deficiency, unspecified: Secondary | ICD-10-CM

## 2019-09-06 DIAGNOSIS — E1169 Type 2 diabetes mellitus with other specified complication: Secondary | ICD-10-CM

## 2019-09-06 DIAGNOSIS — N182 Chronic kidney disease, stage 2 (mild): Secondary | ICD-10-CM | POA: Diagnosis not present

## 2019-09-06 DIAGNOSIS — R42 Dizziness and giddiness: Secondary | ICD-10-CM

## 2019-09-06 DIAGNOSIS — I10 Essential (primary) hypertension: Secondary | ICD-10-CM

## 2019-09-06 MED ORDER — PROMETHAZINE HCL 25 MG PO TABS: ORAL_TABLET | ORAL | 2 refills | Status: DC

## 2019-09-06 NOTE — Patient Instructions (Addendum)
  Regina Cervantes , Thank you for taking time to come for your Annual Wellness Visit. I appreciate your ongoing commitment to your health goals. Please review the following plan we discussed and let me know if I can assist you in the future.   These are the goals we discussed: Goals    . Blood Pressure < 130/80    . DIET - INCREASE WATER INTAKE     Try to get in 4-5 bottles of water daily     . Exercise 150 min/wk Moderate Activity    . LDL CALC < 70    . Weight (lb) < 145 lb (65.8 kg)       This is a list of the screening recommended for you and due dates:  Health Maintenance  Topic Date Due  . Colon Cancer Screening  03/28/2019  . Eye exam for diabetics  04/17/2019  . Complete foot exam   08/23/2019  . Hemoglobin A1C  12/01/2019  . Mammogram  07/10/2020  . Tetanus Vaccine  04/05/2021  . Flu Shot  Completed  . DEXA scan (bone density measurement)  Completed  .  Hepatitis C: One time screening is recommended by Center for Disease Control  (CDC) for  adults born from [birth year] through 1967.   Completed  . Pneumonia vaccines  Completed     Get on a sublingual B12 supplement daily -     HOW TO SCHEDULE A MAMMOGRAM  [facility]  7 a.m.-6:30 p.m., Monday 7 a.m.-5 p.m., Tuesday-Friday Schedule an appointment by calling [phone].  [facility] Schedule an appointment by calling [phone].       Know what a healthy weight is for you (roughly BMI <25) and aim to maintain this  Aim for 7+ servings of fruits and vegetables daily  65-80+ fluid ounces of water or unsweet tea for healthy kidneys  Limit to max 1 drink of alcohol per day; avoid smoking/tobacco  Limit animal fats in diet for cholesterol and heart health - choose grass fed whenever available  Avoid highly processed foods, and foods high in saturated/trans fats  Aim for low stress - take time to unwind and care for your mental health  Aim for 150 min of moderate intensity  exercise weekly for heart health, and weights twice weekly for bone health  Aim for 7-9 hours of sleep daily

## 2019-09-07 ENCOUNTER — Other Ambulatory Visit: Payer: Self-pay | Admitting: Adult Health

## 2019-09-07 LAB — COMPLETE METABOLIC PANEL WITH GFR
AG Ratio: 1.5 (calc) (ref 1.0–2.5)
ALT: 11 U/L (ref 6–29)
AST: 15 U/L (ref 10–35)
Albumin: 4.1 g/dL (ref 3.6–5.1)
Alkaline phosphatase (APISO): 87 U/L (ref 37–153)
BUN: 12 mg/dL (ref 7–25)
CO2: 28 mmol/L (ref 20–32)
Calcium: 10 mg/dL (ref 8.6–10.4)
Chloride: 99 mmol/L (ref 98–110)
Creat: 0.72 mg/dL (ref 0.60–0.93)
GFR, Est African American: 98 mL/min/{1.73_m2} (ref >=60)
GFR, Est Non African American: 85 mL/min/{1.73_m2} (ref >=60)
Globulin: 2.8 g/dL (calc) (ref 1.9–3.7)
Glucose, Bld: 127 mg/dL — ABNORMAL HIGH (ref 65–99)
Potassium: 4 mmol/L (ref 3.5–5.3)
Sodium: 137 mmol/L (ref 135–146)
Total Bilirubin: 0.4 mg/dL (ref 0.2–1.2)
Total Protein: 6.9 g/dL (ref 6.1–8.1)

## 2019-09-07 LAB — HEMOGLOBIN A1C
Hgb A1c MFr Bld: 6.5 % of total Hgb — ABNORMAL HIGH (ref <5.7)
Mean Plasma Glucose: 140 (calc)
eAG (mmol/L): 7.7 (calc)

## 2019-09-07 LAB — MICROALBUMIN / CREATININE URINE RATIO
Creatinine, Urine: 51 mg/dL (ref 20–275)
Microalb Creat Ratio: 18 mcg/mg creat (ref <30)
Microalb, Ur: 0.9 mg/dL

## 2019-09-07 LAB — TSH: TSH: 2.3 mIU/L (ref 0.40–4.50)

## 2019-09-07 LAB — CBC WITH DIFFERENTIAL/PLATELET
Absolute Monocytes: 614 cells/uL (ref 200–950)
Basophils Absolute: 37 cells/uL (ref 0–200)
Basophils Relative: 0.4 %
Eosinophils Absolute: 242 cells/uL (ref 15–500)
Eosinophils Relative: 2.6 %
HCT: 36.8 % (ref 35.0–45.0)
Hemoglobin: 12.1 g/dL (ref 11.7–15.5)
Lymphs Abs: 3246 cells/uL (ref 850–3900)
MCH: 26.8 pg — ABNORMAL LOW (ref 27.0–33.0)
MCHC: 32.9 g/dL (ref 32.0–36.0)
MCV: 81.4 fL (ref 80.0–100.0)
MPV: 10.5 fL (ref 7.5–12.5)
Monocytes Relative: 6.6 %
Neutro Abs: 5162 cells/uL (ref 1500–7800)
Neutrophils Relative %: 55.5 %
Platelets: 272 10*3/uL (ref 140–400)
RBC: 4.52 10*6/uL (ref 3.80–5.10)
RDW: 13.8 % (ref 11.0–15.0)
Total Lymphocyte: 34.9 %
WBC: 9.3 10*3/uL (ref 3.8–10.8)

## 2019-09-07 LAB — VITAMIN D 25 HYDROXY (VIT D DEFICIENCY, FRACTURES): Vit D, 25-Hydroxy: 78 ng/mL (ref 30–100)

## 2019-09-07 LAB — URINALYSIS, ROUTINE W REFLEX MICROSCOPIC
Bacteria, UA: NONE SEEN /HPF
Bilirubin Urine: NEGATIVE
Glucose, UA: NEGATIVE
Hgb urine dipstick: NEGATIVE
Hyaline Cast: NONE SEEN /LPF
Ketones, ur: NEGATIVE
Nitrite: NEGATIVE
Protein, ur: NEGATIVE
RBC / HPF: NONE SEEN /HPF (ref 0–2)
Specific Gravity, Urine: 1.013 (ref 1.001–1.03)
pH: 7.5 (ref 5.0–8.0)

## 2019-09-07 LAB — MAGNESIUM: Magnesium: 1.8 mg/dL (ref 1.5–2.5)

## 2019-09-07 LAB — LIPID PANEL
Cholesterol: 141 mg/dL (ref <200)
HDL: 42 mg/dL — ABNORMAL LOW (ref >=50)
LDL Cholesterol (Calc): 78 mg/dL (calc)
Non-HDL Cholesterol (Calc): 99 mg/dL (calc) (ref <130)
Total CHOL/HDL Ratio: 3.4 (calc) (ref <5.0)
Triglycerides: 130 mg/dL (ref <150)

## 2019-09-07 MED ORDER — ROSUVASTATIN CALCIUM 10 MG PO TABS: 10.0000 mg | ORAL_TABLET | Freq: Every day | ORAL | 1 refills | Status: DC

## 2019-09-17 ENCOUNTER — Other Ambulatory Visit: Payer: Self-pay

## 2019-09-17 DIAGNOSIS — E1140 Type 2 diabetes mellitus with diabetic neuropathy, unspecified: Secondary | ICD-10-CM

## 2019-09-17 MED ORDER — METFORMIN HCL ER 500 MG PO TB24: ORAL_TABLET | ORAL | 1 refills | Status: DC

## 2019-09-26 ENCOUNTER — Other Ambulatory Visit: Payer: Self-pay | Admitting: Internal Medicine

## 2019-09-26 DIAGNOSIS — Z1231 Encounter for screening mammogram for malignant neoplasm of breast: Secondary | ICD-10-CM

## 2019-11-04 ENCOUNTER — Ambulatory Visit: Payer: Medicare HMO

## 2019-11-08 ENCOUNTER — Other Ambulatory Visit: Payer: Self-pay

## 2019-11-08 ENCOUNTER — Ambulatory Visit
Admission: RE | Admit: 2019-11-08 | Discharge: 2019-11-08 | Disposition: A | Discharge status: Home / Self Care | Payer: Medicare HMO | Source: Ambulatory Visit | Attending: Internal Medicine | Admitting: Internal Medicine

## 2019-11-08 DIAGNOSIS — Z1231 Encounter for screening mammogram for malignant neoplasm of breast: Secondary | ICD-10-CM | POA: Diagnosis not present

## 2019-11-09 ENCOUNTER — Ambulatory Visit: Payer: Medicare HMO | Attending: Internal Medicine

## 2019-11-09 DIAGNOSIS — Z23 Encounter for immunization: Secondary | ICD-10-CM | POA: Insufficient documentation

## 2019-11-09 NOTE — Progress Notes (Signed)
   Covid-19 Vaccination Clinic  Name:  Regina Cervantes    MRN: [id number] DOB: 1951/02/22  11/09/2019  Regina Cervantes was observed post Covid-19 immunization for 30 minutes based on pre-vaccination screening without incidence. She was provided with Vaccine Information Sheet and instruction to access the V-Safe system.   Regina Cervantes was instructed to call 911 with any severe reactions post vaccine: Marland Kitchen Difficulty breathing  . Swelling of your face and throat  . A fast heartbeat  . A bad rash all over your body  . Dizziness and weakness    Immunizations Administered    Name Date Dose VIS Date Route   Pfizer COVID-19 Vaccine 11/09/2019  8:22 AM 0.3 mL 08/30/2019 Intramuscular   Manufacturer: [place]   Lot: [id number]   [place]: [id number]

## 2019-11-29 DIAGNOSIS — R69 Illness, unspecified: Secondary | ICD-10-CM | POA: Diagnosis not present

## 2019-12-03 ENCOUNTER — Ambulatory Visit: Payer: Medicare HMO | Attending: Internal Medicine

## 2019-12-03 ENCOUNTER — Other Ambulatory Visit: Payer: Self-pay | Admitting: Internal Medicine

## 2019-12-03 DIAGNOSIS — E1140 Type 2 diabetes mellitus with diabetic neuropathy, unspecified: Secondary | ICD-10-CM

## 2019-12-03 DIAGNOSIS — Z23 Encounter for immunization: Secondary | ICD-10-CM

## 2019-12-03 NOTE — Progress Notes (Signed)
   Covid-19 Vaccination Clinic  Name:  Regina Cervantes    MRN: [id number] DOB: Oct 23, 1950  12/03/2019  [name] was observed post Covid-19 immunization for 15 minutes without incident. She was provided with Vaccine Information Sheet and instruction to access the V-Safe system.   [name] was instructed to call 911 with any severe reactions post vaccine: Marland Kitchen Difficulty breathing  . Swelling of face and throat  . A fast heartbeat  . A bad rash all over body  . Dizziness and weakness   Immunizations Administered    Name Date Dose VIS Date Route   Pfizer COVID-19 Vaccine 12/03/2019  3:53 PM 0.3 mL 08/30/2019 Intramuscular   Manufacturer: [place]   Lot: [id number]   [place]: [id number]

## 2019-12-25 ENCOUNTER — Encounter: Payer: Self-pay | Admitting: Internal Medicine

## 2019-12-25 NOTE — Patient Instructions (Signed)

## 2019-12-25 NOTE — Progress Notes (Signed)
History of Present Illness:       This very nice 69 y.o. MWF presents for 3 month follow up with HTN, HLD, T2_NIDDM  and Vitamin D Deficiency.       Patient has hx/o Rt Rotator Cuff surg (2018)  / Dr Onnie Graham in the past and is having pains of her Lt shoulder limiting her ROM.       Patient is treated for HTN circa 2004 & BP has been controlled at home. Today's BP is at goal - 114/60. Patient has had no complaints of any cardiac type chest pain, palpitations, dyspnea / orthopnea / PND, dizziness, claudication, or dependent edema.      Hyperlipidemia is controlled with diet & Rosuvastatin. Patient denies myalgias or other med SE's. Last Lipids were at goal:  Lab Results  Component Value Date   CHOL 141 09/06/2019   HDL 42 (L) 09/06/2019   LDLCALC 78 09/06/2019   TRIG 130 09/06/2019   CHOLHDL 3.4 09/06/2019    Also, the patient has Moderate Obesity (BMI 29.9) & consequent  T2_NIDDM/CKD2 (GFR 85) since 2004  and has had no symptoms of reactive hypoglycemia, diabetic polys, paresthesias or visual blurring.  Last A1c was not at goal:  Lab Results  Component Value Date   HGBA1C 6.5 (H) 09/06/2019       Further, the patient also has history of Vitamin D Deficiency("37" / 2014)   and supplements vitamin D without any suspected side-effects. Last vitamin D was at goal:  Lab Results  Component Value Date   VD25OH 78 09/06/2019    Current Outpatient Medications on File Prior to Visit  Medication Sig  . aspirin 81 MG chewable tablet Chew by mouth daily.  Marland Kitchen BAYER MICROLET LANCETS lancets Use as instructed  . bisoprolol-hydrochlorothiazide (ZIAC) 5-6.25 MG tablet Take 1 tablet Daily for BP  . Blood Glucose Monitoring Suppl (CONTOUR BLOOD GLUCOSE SYSTEM) DEVI Test Blood sugar once daily  . cetirizine (ZYRTEC) 10 MG tablet Take 10 mg by mouth daily.  . Cholecalciferol (VITAMIN D PO) Take 6,000 Int'l Units by mouth daily.   . ferrous sulfate dried (SLOW FE) 160 (50 FE) MG TBCR SR  tablet Take 160 mg by mouth daily.  Marland Kitchen glucose blood (ONETOUCH ULTRA) test strip Check Blood Sugar Daily (Dx: E11.9)  . hydrochlorothiazide (HYDRODIURIL) 25 MG tablet Take 1 tablet Daily for BP, Fluid Retention / Ankle Swelling  . Magnesium 500 MG CAPS Take 500 mg by mouth 3 (three) times daily.   . meclizine (ANTIVERT) 25 MG tablet Take 1 tablet 3 x / day as needed for Dizziness  . metFORMIN (GLUCOPHAGE-XR) 500 MG 24 hr tablet Take 2 tablets 2 x /day with Meals for Diabetes  . olmesartan (BENICAR) 40 MG tablet TAKE 1 TABLET BY MOUTH EVERY DAY  . phentermine (ADIPEX-P) 37.5 MG tablet Take 1/2 to 1 tablet every [place] for Dieting & Weight Loss  . promethazine (PHENERGAN) 25 MG tablet Take 1 tablet every 4 hours if needed for nausea or dizziness  . rosuvastatin (CRESTOR) 10 MG tablet Take 1 tablet (10 mg total) by mouth daily.   No current facility-administered medications on file prior to visit.    Allergies  Allergen Reactions  . Penicillins Other (See Comments)  . Lisinopril Cough  . Topamax [Topiramate] Other (See Comments)    Affected mood, lathargic    PMHx:   Past Medical History:  Diagnosis Date  . Allergy   . Closed fracture of  proximal phalanx of great toe 03/06/2018  . Hyperlipidemia   . Hypertension   . Iron deficiency   . Meniere's disease   . Neuropathy   . Type II or unspecified type diabetes mellitus without mention of complication, not stated as uncontrolled   . Vitamin D deficiency     Immunization History  Administered Date(s) Administered  . Influenza Split 07/16/2013, 07/24/2014  . Influenza, High Dose Seasonal PF 06/15/2015, 07/04/2016, 07/14/2017, 06/19/2018, 06/03/2019  . PFIZER SARS-COV-2 Vaccination 11/09/2019, 12/03/2019  . PPD Test 04/14/2014  . Pneumococcal Conjugate-13 10/31/2014  . Pneumococcal Polysaccharide-23 12/01/2004, 03/23/2016  . Tdap 04/06/2011  . Zoster 07/16/2013    Past Surgical History:  Procedure Laterality Date  . ABDOMINAL  HYSTERECTOMY  1998   BSO  . ORIF ANKLE FRACTURE Left 2000  . SHOULDER SURGERY Right 02/2017   rotator cuff repair Dr. Augustin Coupe    FHx:    Reviewed / unchanged  SHx:    Reviewed / unchanged   Systems Review:  Constitutional: Denies fever, chills, wt changes, headaches, insomnia, fatigue, night sweats, change in appetite. Eyes: Denies redness, blurred vision, diplopia, discharge, itchy, watery eyes.  ENT: Denies discharge, congestion, post nasal drip, epistaxis, sore throat, earache, hearing loss, dental pain, tinnitus, vertigo, sinus pain, snoring.  CV: Denies chest pain, palpitations, irregular heartbeat, syncope, dyspnea, diaphoresis, orthopnea, PND, claudication or edema. Respiratory: denies cough, dyspnea, DOE, pleurisy, hoarseness, laryngitis, wheezing.  Gastrointestinal: Denies dysphagia, odynophagia, heartburn, reflux, water brash, abdominal pain or cramps, nausea, vomiting, bloating, diarrhea, constipation, hematemesis, melena, hematochezia  or hemorrhoids. Genitourinary: Denies dysuria, frequency, urgency, nocturia, hesitancy, discharge, hematuria or flank pain. Musculoskeletal: Denies arthralgias, myalgias, stiffness, jt. swelling, pain, limping or strain/sprain.  Skin: Denies pruritus, rash, hives, warts, acne, eczema or change in skin lesion(s). Neuro: No weakness, tremor, incoordination, spasms, paresthesia or pain. Psychiatric: Denies confusion, memory loss or sensory loss. Endo: Denies change in weight, skin or hair change.  Heme/Lymph: No excessive bleeding, bruising or enlarged lymph nodes.  Physical Exam  BP 114/60   Pulse 84   Temp (!) 97.1 F (36.2 C)   Resp 16   Ht 4\' 11"  (1.499 m)   Wt 142 lb 9.6 oz (64.7 kg)   BMI 28.80 kg/m   Appears  well nourished, well groomed  and in no distress.  Eyes: PERRLA, EOMs, conjunctiva no swelling or erythema. Sinuses: No frontal/maxillary tenderness ENT/Mouth: EAC's clear, TM's nl w/o erythema, bulging. Nares clear w/o  erythema, swelling, exudates. Oropharynx clear without erythema or exudates. Oral hygiene is good. Tongue normal, non obstructing. Hearing intact.  Neck: Supple. Thyroid not palpable. Car 2+/2+ without bruits, nodes or JVD. Chest: Respirations nl with BS clear & equal w/o rales, rhonchi, wheezing or stridor.  Cor: Heart sounds normal w/ regular rate and rhythm without sig. murmurs, gallops, clicks or rubs. Peripheral pulses normal and equal  without edema.  Abdomen: Soft & bowel sounds normal. Non-tender w/o guarding, rebound, hernias, masses or organomegaly.  Lymphatics: Unremarkable.  Musculoskeletal:  Decreased abduction & internal / external rotation of the Left shoulder secondary to pain. Otherwise, full ROM all peripheral extremities, joint stability, 5/5 strength and normal gait.  Skin: Warm, dry without exposed rashes, lesions or ecchymosis apparent.  Neuro: Cranial nerves intact, reflexes equal bilaterally. Sensory-motor testing grossly intact. Tendon reflexes grossly intact.  Pysch: Alert & oriented x 3.  Insight and judgement nl & appropriate. No ideations.  Assessment and Plan:  1. Essential hypertension  - Continue medication, monitor blood pressure at home.  -  Continue DASH diet.  Reminder to go to the ER if any CP,  SOB, nausea, dizziness, severe HA, changes vision/speech.  - CBC with Differential/Platelet - COMPLETE METABOLIC PANEL WITH GFR - Magnesium - TSH  2. Hyperlipidemia associated with type 2 diabetes mellitus ([place])  - Continue diet/meds, exercise,& lifestyle modifications.  - Continue monitor periodic cholesterol/liver & renal functions   - Lipid panel - TSH  3. Type 2 diabetes mellitus with stage 2 chronic kidney disease,  without long-term current use of insulin ([facility])  - Continue diet, exercise  - Lifestyle modifications.  - Monitor appropriate labs.  - Hemoglobin A1c - Insulin, random  4. Vitamin D deficiency  - Continue supplementation.   5.  Medication management  - CBC with Differential/Platelet - COMPLETE METABOLIC PANEL WITH GFR - Magnesium - Lipid panel - TSH - Hemoglobin A1c - Insulin, random  6. Left Shoulder Pain, ? Rotator Cuff injury   - Referral back to Dr Justice Britain           Discussed  regular exercise, BP monitoring, weight control to achieve/maintain BMI less than 25 and discussed med and SE's. Recommended labs to assess and monitor clinical status with further disposition pending results of labs.  I discussed the assessment and treatment plan with the patient. The patient was provided an opportunity to ask questions and all were answered. The patient agreed with the plan and demonstrated an understanding of the instructions.  I provided over 30 minutes of exam, counseling, chart review and  complex critical decision making.         The patient was advised to call back or seek an in-person evaluation if the symptoms worsen or if the condition fails to improve as anticipated.   Kirtland Bouchard, MD

## 2019-12-26 ENCOUNTER — Other Ambulatory Visit: Payer: Self-pay

## 2019-12-26 ENCOUNTER — Ambulatory Visit (INDEPENDENT_AMBULATORY_CARE_PROVIDER_SITE_OTHER): Payer: Medicare HMO | Admitting: Internal Medicine

## 2019-12-26 VITALS — BP 114/60 | HR 84 | Temp 97.1°F | Resp 16 | Ht 59.0 in | Wt 142.6 lb

## 2019-12-26 DIAGNOSIS — E1122 Type 2 diabetes mellitus with diabetic chronic kidney disease: Secondary | ICD-10-CM | POA: Diagnosis not present

## 2019-12-26 DIAGNOSIS — G8929 Other chronic pain: Secondary | ICD-10-CM

## 2019-12-26 DIAGNOSIS — E785 Hyperlipidemia, unspecified: Secondary | ICD-10-CM

## 2019-12-26 DIAGNOSIS — M25512 Pain in left shoulder: Secondary | ICD-10-CM | POA: Diagnosis not present

## 2019-12-26 DIAGNOSIS — I10 Essential (primary) hypertension: Secondary | ICD-10-CM | POA: Diagnosis not present

## 2019-12-26 DIAGNOSIS — N182 Chronic kidney disease, stage 2 (mild): Secondary | ICD-10-CM

## 2019-12-26 DIAGNOSIS — E1169 Type 2 diabetes mellitus with other specified complication: Secondary | ICD-10-CM

## 2019-12-26 DIAGNOSIS — E559 Vitamin D deficiency, unspecified: Secondary | ICD-10-CM | POA: Diagnosis not present

## 2019-12-26 DIAGNOSIS — Z79899 Other long term (current) drug therapy: Secondary | ICD-10-CM | POA: Diagnosis not present

## 2019-12-27 LAB — LIPID PANEL
Cholesterol: 129 mg/dL (ref <200)
HDL: 46 mg/dL — ABNORMAL LOW (ref >=50)
LDL Cholesterol (Calc): 62 mg/dL (calc)
Non-HDL Cholesterol (Calc): 83 mg/dL (calc) (ref <130)
Total CHOL/HDL Ratio: 2.8 (calc) (ref <5.0)
Triglycerides: 125 mg/dL (ref <150)

## 2019-12-27 LAB — COMPLETE METABOLIC PANEL WITH GFR
AG Ratio: 1.4 (calc) (ref 1.0–2.5)
ALT: 13 U/L (ref 6–29)
AST: 15 U/L (ref 10–35)
Albumin: 4.2 g/dL (ref 3.6–5.1)
Alkaline phosphatase (APISO): 80 U/L (ref 37–153)
BUN: 11 mg/dL (ref 7–25)
CO2: 28 mmol/L (ref 20–32)
Calcium: 10.4 mg/dL (ref 8.6–10.4)
Chloride: 98 mmol/L (ref 98–110)
Creat: 0.69 mg/dL (ref 0.60–0.93)
GFR, Est African American: 102 mL/min/{1.73_m2} (ref >=60)
GFR, Est Non African American: 88 mL/min/{1.73_m2} (ref >=60)
Globulin: 2.9 g/dL (calc) (ref 1.9–3.7)
Glucose, Bld: 116 mg/dL — ABNORMAL HIGH (ref 65–99)
Potassium: 3.8 mmol/L (ref 3.5–5.3)
Sodium: 135 mmol/L (ref 135–146)
Total Bilirubin: 0.5 mg/dL (ref 0.2–1.2)
Total Protein: 7.1 g/dL (ref 6.1–8.1)

## 2019-12-27 LAB — CBC WITH DIFFERENTIAL/PLATELET
Absolute Monocytes: 473 cells/uL (ref 200–950)
Basophils Absolute: 50 cells/uL (ref 0–200)
Basophils Relative: 0.6 %
Eosinophils Absolute: 199 cells/uL (ref 15–500)
Eosinophils Relative: 2.4 %
HCT: 38.4 % (ref 35.0–45.0)
Hemoglobin: 12.3 g/dL (ref 11.7–15.5)
Lymphs Abs: 3071 cells/uL (ref 850–3900)
MCH: 26.8 pg — ABNORMAL LOW (ref 27.0–33.0)
MCHC: 32 g/dL (ref 32.0–36.0)
MCV: 83.7 fL (ref 80.0–100.0)
MPV: 10.4 fL (ref 7.5–12.5)
Monocytes Relative: 5.7 %
Neutro Abs: 4507 cells/uL (ref 1500–7800)
Neutrophils Relative %: 54.3 %
Platelets: 266 10*3/uL (ref 140–400)
RBC: 4.59 10*6/uL (ref 3.80–5.10)
RDW: 13.5 % (ref 11.0–15.0)
Total Lymphocyte: 37 %
WBC: 8.3 10*3/uL (ref 3.8–10.8)

## 2019-12-27 LAB — TSH: TSH: 2.69 mIU/L (ref 0.40–4.50)

## 2019-12-27 LAB — HEMOGLOBIN A1C
Hgb A1c MFr Bld: 6.3 % of total Hgb — ABNORMAL HIGH (ref <5.7)
Mean Plasma Glucose: 134 (calc)
eAG (mmol/L): 7.4 (calc)

## 2019-12-27 LAB — MAGNESIUM: Magnesium: 1.7 mg/dL (ref 1.5–2.5)

## 2019-12-27 LAB — INSULIN, RANDOM: Insulin: 14.6 u[IU]/mL

## 2020-01-01 ENCOUNTER — Other Ambulatory Visit: Payer: Self-pay | Admitting: Internal Medicine

## 2020-01-11 ENCOUNTER — Other Ambulatory Visit: Payer: Self-pay | Admitting: Adult Health

## 2020-01-11 DIAGNOSIS — I10 Essential (primary) hypertension: Secondary | ICD-10-CM

## 2020-02-10 DIAGNOSIS — M25512 Pain in left shoulder: Secondary | ICD-10-CM | POA: Diagnosis not present

## 2020-02-13 ENCOUNTER — Other Ambulatory Visit: Payer: Self-pay | Admitting: Adult Health

## 2020-02-17 ENCOUNTER — Other Ambulatory Visit: Payer: Self-pay | Admitting: Adult Health

## 2020-02-19 DIAGNOSIS — H11153 Pinguecula, bilateral: Secondary | ICD-10-CM | POA: Diagnosis not present

## 2020-02-19 DIAGNOSIS — G43109 Migraine with aura, not intractable, without status migrainosus: Secondary | ICD-10-CM | POA: Diagnosis not present

## 2020-02-19 DIAGNOSIS — H25012 Cortical age-related cataract, left eye: Secondary | ICD-10-CM | POA: Diagnosis not present

## 2020-02-19 DIAGNOSIS — H2513 Age-related nuclear cataract, bilateral: Secondary | ICD-10-CM | POA: Diagnosis not present

## 2020-02-19 LAB — HM DIABETES EYE EXAM

## 2020-02-29 DIAGNOSIS — R69 Illness, unspecified: Secondary | ICD-10-CM | POA: Diagnosis not present

## 2020-03-02 ENCOUNTER — Other Ambulatory Visit: Payer: Self-pay | Admitting: Internal Medicine

## 2020-03-10 ENCOUNTER — Other Ambulatory Visit: Payer: Self-pay | Admitting: Adult Health

## 2020-03-10 DIAGNOSIS — E1140 Type 2 diabetes mellitus with diabetic neuropathy, unspecified: Secondary | ICD-10-CM

## 2020-03-25 DIAGNOSIS — M25512 Pain in left shoulder: Secondary | ICD-10-CM | POA: Diagnosis not present

## 2020-04-23 DIAGNOSIS — K136 Irritative hyperplasia of oral mucosa: Secondary | ICD-10-CM | POA: Diagnosis not present

## 2020-04-30 ENCOUNTER — Other Ambulatory Visit: Payer: Self-pay | Admitting: Internal Medicine

## 2020-04-30 DIAGNOSIS — I10 Essential (primary) hypertension: Secondary | ICD-10-CM

## 2020-06-02 ENCOUNTER — Other Ambulatory Visit: Payer: Self-pay | Admitting: Internal Medicine

## 2020-06-03 ENCOUNTER — Other Ambulatory Visit: Payer: Self-pay | Admitting: Internal Medicine

## 2020-06-03 DIAGNOSIS — E1140 Type 2 diabetes mellitus with diabetic neuropathy, unspecified: Secondary | ICD-10-CM

## 2020-06-03 DIAGNOSIS — R69 Illness, unspecified: Secondary | ICD-10-CM | POA: Diagnosis not present

## 2020-06-05 NOTE — Progress Notes (Addendum)
MEDICARE ANNUAL WELLNESS VISIT AND FOLLOW UP  Assessment:    Medicare annual wellness visit  Essential hypertension - continue medications, DASH diet, exercise and monitor at home. Call if greater than 130/80.  - CBC with Differential/Platelet - CMP/GFR - TSH  Type 2 diabetes mellitus with sensory neuropathy ([place]) Discussed general issues about diabetes pathophysiology and management., Educational material distributed., Suggested low cholesterol diet., Encouraged aerobic exercise., Discussed foot care., Reminded to get yearly retinal exam.- report requested - Hemoglobin A1c  Overweight with comorbidities - long discussion about weight loss, diet, and exercise Doing well with phentermine PRN; reminded to take routine drug break - Lipid panel - Hemoglobin A1c  Hyperlipidemia -continue medications, check lipids, decrease fatty foods, increase activity.  - LDL goal <70; consider switch agents if remains above goal  - Lipid panel  Vitamin D deficiency Continue supplement, check at CPE   Medication management - Magnesium  Meniere's disease, unspecified laterality Continue HCTZ, monitor kidney function Follow up Dr. Lucia Gaskins  Fatty liver Weight loss advised, avoid alcohol/tylenol, will monitor LFTs  Environmental allegies Continue OTC antihistamine, singulair Hygiene reviewed  Situational depression Declines meds, lifestyle reviewed; she plans to pursue counseling Grief counseling via hospice F/u if not improving  Ophthalmic migraine Occasional, stress related; doing well with conservative therapy - rest/ibuprofen Also given nurtec samples today  Lifestyle discussed   Over 30 minutes of exam, counseling, chart review, and critical decision making was performed  Future Appointments  Date Time Provider [place]  09/07/2020  9:00 AM [name], NP [facility] None    Plan:   During the course of the visit the patient was educated and counseled about  appropriate screening and preventive services including:    Pneumococcal vaccine   Influenza vaccine  Td vaccine  Prevnar 13  Screening electrocardiogram  Screening mammography  Bone densitometry screening  Colorectal cancer screening  Diabetes screening  Glaucoma screening  Nutrition counseling   Advanced directives: given info/requested copies   Subjective:   Giliana Vantil is a 69 y.o. female who presents for Medicare Annual Wellness Visit and 3 month follow up on hypertension, diabetes with DM neuropathy, hyperlipidemia, vitamin D def.   She reports she has a lot going on in the family, sister passed due to metastatic kidney cancer. Feels she is managing mood fairly, she is taking melatonin 3 mg for sleep which is is working well. Has had some occasional ophthalmic migraines - 1 in last month. Rest and ibuprofen helps.   She has seen Dr. Lucia Gaskins for her meniere's, she is back on her HCTZ.  she is prescribed phentermine for weight loss. Taking 1/2 tab only. Plans to take a break soon. While on the medication they have lost 6 lbs since last visit. They deny palpitations, anxiety, trouble sleeping, elevated BP.   BMI is Body mass index is 27.59 kg/m., she is working on diet and exercise. Wt Readings from Last 3 Encounters:  06/08/20 136 lb 9.6 oz (62 kg)  12/26/19 142 lb 9.6 oz (64.7 kg)  09/06/19 143 lb (64.9 kg)   Her blood pressure has been controlled at home, today their BP is BP: 110/68  She does not workout regularly due to was caring for her sister. Plans to restart walking. She denies chest pain, shortness of breath, dizziness.    She is on cholesterol medication (simvastatin 40 mg daily) and denies myalgias. Her cholesterol is at goal. The cholesterol last visit was:   Lab Results  Component Value Date  CHOL 129 12/26/2019   HDL 46 (L) 12/26/2019   LDLCALC 62 12/26/2019   TRIG 125 12/26/2019   CHOLHDL 2.8 12/26/2019    She was first diagnosed with  DM 10-15 years ago, has been working on diet and exercise for diabetes with DM neuropathy (tingling, manages with topicals, podiatry following), she has done a great job bringing down her A1C with weight loss/phentermine, she is on metformin, bASA, ACE, and denies polydipsia, polyuria and visual disturbances. She does check fasting sugars occasionally, ranging 110-150. Last A1C in the office was:  Lab Results  Component Value Date   HGBA1C 6.3 (H) 12/26/2019   Patient is on Vitamin D supplement.   Lab Results  Component Value Date   VD25OH 78 09/06/2019     Lab Results  Component Value Date   GFRNONAA 88 12/26/2019     Medication Review Current Outpatient Medications on File Prior to Visit  Medication Sig  . aspirin 81 MG chewable tablet Chew by mouth daily.  Marland Kitchen BAYER MICROLET LANCETS lancets Use as instructed  . bisoprolol-hydrochlorothiazide (ZIAC) 5-6.25 MG tablet Take 1 tablet Daily for BP  . Blood Glucose Monitoring Suppl (CONTOUR BLOOD GLUCOSE SYSTEM) DEVI Test Blood sugar once daily  . cetirizine (ZYRTEC) 10 MG tablet Take 10 mg by mouth daily.  . Cholecalciferol (VITAMIN D PO) Take 6,000 Int'l Units by mouth daily.   . ferrous sulfate dried (SLOW FE) 160 (50 FE) MG TBCR SR tablet Take 160 mg by mouth daily.  Marland Kitchen glucose blood (ONETOUCH ULTRA) test strip CHECK BLOOD SUGAR DAILY  . hydrochlorothiazide (HYDRODIURIL) 25 MG tablet TAKE 1 TABLET DAILY FOR BP & FLUID RETENTION (ANKLE SWELLING)  . Magnesium 500 MG CAPS Take 500 mg by mouth 3 (three) times daily.   . meclizine (ANTIVERT) 25 MG tablet Take 1 tablet 3 x / day as needed for Dizziness  . melatonin 3 MG TABS tablet Take 3 mg by mouth at bedtime.  . metFORMIN (GLUCOPHAGE-XR) 500 MG 24 hr tablet Take 2 tablets 2 x /day with Meals for Diabetes  . olmesartan (BENICAR) 40 MG tablet TAKE ONE TABLET BY MOUTH DAILY FOR BLOOD PRESSURE  . phentermine (ADIPEX-P) 37.5 MG tablet TAKE 1/2 TO 1 TABLET EVERY MORNING FOR DIETING & WEIGHT LOSS   . promethazine (PHENERGAN) 25 MG tablet Take 1 tablet every 4 hours if needed for nausea or dizziness  . rosuvastatin (CRESTOR) 10 MG tablet TAKE 1 TABLET BY MOUTH EVERY DAY  . vitamin B-12 (CYANOCOBALAMIN) 1000 MCG tablet Take 1,000 mcg by mouth daily.   No current facility-administered medications on file prior to visit.    Allergies: Allergies  Allergen Reactions  . Penicillins Other (See Comments)  . Lisinopril Cough  . Topamax [Topiramate] Other (See Comments)    Affected mood, lathargic    Current Problems (verified) has Hyperlipidemia associated with type 2 diabetes mellitus ([place]); Hypertension; Vitamin D deficiency; Meniere's disease; Medication management; Overweight (BMI 25.0-29.9); Type 2 diabetes mellitus with sensory neuropathy ([place]); Fatty liver; CKD stage 2 due to type 1 diabetes mellitus ([place]); Environmental allergies; Situational depression; and Neuropathy due to type 2 diabetes mellitus ([place]) on their problem list.  Screening Tests Immunization History  Administered Date(s) Administered  . Influenza Split 07/16/2013, 07/24/2014  . Influenza, High Dose Seasonal PF 06/15/2015, 07/04/2016, 07/14/2017, 06/19/2018, 06/03/2019  . PFIZER SARS-COV-2 Vaccination 11/09/2019, 12/03/2019  . PPD Test 04/14/2014  . Pneumococcal Conjugate-13 10/31/2014  . Pneumococcal Polysaccharide-23 12/01/2004, 03/23/2016  . Tdap 04/06/2011  . Zoster 07/16/2013  Preventative care: Last colonoscopy: 2010 Last cologuard: 06/2019 negative Last mammogram: 10/2019 Last pap smear/pelvic exam: remote, declines another DEXA: 2019 - normal  CXR:11/2010 [place] AB 2014  Prior vaccinations: TD or Tdap: 2012  Influenza: 05/2019 Pneumococcal: 2017 Prevnar13: 2016 Shingles/Zostavax: 2014 Covid 19: 2/2, 2021, pfizer  Names of Other Physician/Practitioners you currently use: 1. [facility]- here for primary care 2. [facility], Dr. Willette Brace, eye doctor, last  visit 06/28/2019 abstracted - will schedule follow up 3. Dr. Bjorn Pippin, dentist, last visit 2021   Patient Care Team: [name], MD as PCP - General (Internal Medicine) Inda Castle, MD (Inactive) as Consulting Physician (Gastroenterology) Rozetta Nunnery, MD as Consulting Physician (Otolaryngology) Rona Ravens, [place] (Optometry) Justice Britain, MD as Consulting Physician (Orthopedic Surgery) Evelina Bucy, DPM as Consulting Physician (Podiatry)  Surgical: She  has a past surgical history that includes ORIF ankle fracture (Left, 2000); Abdominal hysterectomy (1998); and Shoulder surgery (Right, 02/2017). Family Her family history includes Alcoholism in her mother; Autism in her brother; Breast cancer (age of onset: 26) in her paternal aunt; COPD in her brother; Diabetes in her father; Heart disease in her father and son; Hypertension in her father; Kidney cancer in her sister; Kidney disease in her son; Liver cancer in her maternal grandmother; Lung cancer in her brother; Pancreatic cancer in her mother. Social history  She reports that she has never smoked. She has never used smokeless tobacco. She reports current alcohol use. She reports that she does not use drugs.  MEDICARE WELLNESS OBJECTIVES: Physical activity: Current Exercise Habits: The patient does not participate in regular exercise at present, Exercise limited by: None identified Cardiac risk factors: Cardiac Risk Factors include: advanced age (>21men, >78 women);dyslipidemia;hypertension;sedentary lifestyle;diabetes mellitus Depression/mood screen:   Depression screen [facility] 2/9 06/08/2020  Decreased Interest 0  Down, Depressed, Hopeless 1  PHQ - 2 Score 1  Altered sleeping 1  Tired, decreased energy 1  Change in appetite 1  Feeling bad or failure about yourself  0  Trouble concentrating 0  Moving slowly or fidgety/restless 0  Suicidal thoughts 0  PHQ-9 Score 4  Difficult doing work/chores Not difficult at  all    ADLs:  In your present state of health, do you have any difficulty performing the following activities: 06/08/2020 12/25/2019  Hearing? N N  Vision? N N  Difficulty concentrating or making decisions? N N  Walking or climbing stairs? N N  Dressing or bathing? N N  Doing errands, shopping? N N  Some recent data might be hidden     Cognitive Testing  Alert? Yes  Normal Appearance?Yes  Oriented to person? Yes  Place? Yes   Time? Yes  Recall of three objects?  Yes  Can perform simple calculations? Yes  Displays appropriate judgment?Yes  Can read the correct time from a watch face?Yes  EOL planning: Does Patient Have a Medical Advance Directive?: No Would patient like information on creating a medical advance directive?: No - Patient declined   Review of Systems  Constitutional: Negative.  Negative for malaise/fatigue and weight loss.  HENT: Negative.  Negative for hearing loss and tinnitus.   Eyes: Negative.  Negative for blurred vision and double vision.  Respiratory: Negative.  Negative for cough, sputum production, shortness of breath and wheezing.   Cardiovascular: Negative.  Negative for chest pain, palpitations, orthopnea, claudication, leg swelling and PND.  Gastrointestinal: Negative.  Negative for abdominal pain, blood in stool, constipation, diarrhea, heartburn, melena, nausea and  vomiting.  Genitourinary: Negative.   Musculoskeletal: Negative.  Negative for falls, joint pain and myalgias.  Skin: Negative.  Negative for rash.  Neurological: Positive for headaches. Negative for dizziness, tingling, sensory change and weakness.  Endo/Heme/Allergies: Negative.  Negative for polydipsia.  Psychiatric/Behavioral: Negative.  Negative for depression, memory loss, substance abuse and suicidal ideas. The patient is not nervous/anxious and does not have insomnia.   All other systems reviewed and are negative.   Objective:   Today's Vitals   06/08/20 0905  BP: 110/68   Pulse: 85  Temp: (!) 96.8 F (36 C)  SpO2: 96%  Weight: 136 lb 9.6 oz (62 kg)   Body mass index is 27.59 kg/m.  General appearance: alert, no distress, WD/WN,  female HEENT: normocephalic, sclerae anicteric, TMs pearly, nares patent, no discharge or erythema, pharynx normal Oral cavity: MMM, no lesions Neck: supple, no lymphadenopathy, no thyromegaly, no masses Heart: RRR, normal S1, S2, no murmurs Lungs: CTA bilaterally, no wheezes, rhonchi, or rales Abdomen: +bs, soft, non tender, non distended, no masses, no hepatomegaly, no splenomegaly Musculoskeletal: nontender, no swelling, no obvious deformity Extremities: no edema, no cyanosis, no clubbing Pulses: 2+ symmetric, upper and lower extremities, normal cap refill Neurological: alert, oriented x 3, CN2-12 intact, strength normal upper extremities and lower extremities, sensation normal throughout, DTRs 2+ throughout, no cerebellar signs, gait normal Psychiatric: normal affect, behavior normal, pleasant  Breast: defer Gyn: defer Rectal: defer   Medicare Attestation I have personally reviewed: The patient's medical and social history Their use of alcohol, tobacco or illicit drugs Their current medications and supplements The patient's functional ability including ADLs,fall risks, home safety risks, cognitive, and hearing and visual impairment Diet and physical activities Evidence for depression or mood disorders  The patient's weight, height, BMI, and visual acuity have been recorded in the chart.  I have made referrals, counseling, and provided education to the patient based on review of the above and I have provided the patient with a written personalized care plan for preventive services.     Izora Ribas, NP   06/08/2020

## 2020-06-08 ENCOUNTER — Encounter: Payer: Self-pay | Admitting: Adult Health

## 2020-06-08 ENCOUNTER — Ambulatory Visit (INDEPENDENT_AMBULATORY_CARE_PROVIDER_SITE_OTHER): Payer: Medicare HMO | Admitting: Adult Health

## 2020-06-08 ENCOUNTER — Other Ambulatory Visit: Payer: Self-pay

## 2020-06-08 VITALS — BP 110/68 | HR 85 | Temp 96.8°F | Wt 136.6 lb

## 2020-06-08 DIAGNOSIS — N182 Chronic kidney disease, stage 2 (mild): Secondary | ICD-10-CM

## 2020-06-08 DIAGNOSIS — H8109 Meniere's disease, unspecified ear: Secondary | ICD-10-CM

## 2020-06-08 DIAGNOSIS — K760 Fatty (change of) liver, not elsewhere classified: Secondary | ICD-10-CM

## 2020-06-08 DIAGNOSIS — E1022 Type 1 diabetes mellitus with diabetic chronic kidney disease: Secondary | ICD-10-CM | POA: Diagnosis not present

## 2020-06-08 DIAGNOSIS — R69 Illness, unspecified: Secondary | ICD-10-CM | POA: Diagnosis not present

## 2020-06-08 DIAGNOSIS — E663 Overweight: Secondary | ICD-10-CM

## 2020-06-08 DIAGNOSIS — E1169 Type 2 diabetes mellitus with other specified complication: Secondary | ICD-10-CM | POA: Diagnosis not present

## 2020-06-08 DIAGNOSIS — E785 Hyperlipidemia, unspecified: Secondary | ICD-10-CM | POA: Diagnosis not present

## 2020-06-08 DIAGNOSIS — Z0001 Encounter for general adult medical examination with abnormal findings: Secondary | ICD-10-CM

## 2020-06-08 DIAGNOSIS — Z79899 Other long term (current) drug therapy: Secondary | ICD-10-CM | POA: Diagnosis not present

## 2020-06-08 DIAGNOSIS — R6889 Other general symptoms and signs: Secondary | ICD-10-CM

## 2020-06-08 DIAGNOSIS — E1140 Type 2 diabetes mellitus with diabetic neuropathy, unspecified: Secondary | ICD-10-CM

## 2020-06-08 DIAGNOSIS — Z9109 Other allergy status, other than to drugs and biological substances: Secondary | ICD-10-CM

## 2020-06-08 DIAGNOSIS — Z0000 Encounter for general adult medical examination without abnormal findings: Secondary | ICD-10-CM

## 2020-06-08 DIAGNOSIS — I10 Essential (primary) hypertension: Secondary | ICD-10-CM

## 2020-06-08 DIAGNOSIS — G43109 Migraine with aura, not intractable, without status migrainosus: Secondary | ICD-10-CM

## 2020-06-08 DIAGNOSIS — E559 Vitamin D deficiency, unspecified: Secondary | ICD-10-CM | POA: Diagnosis not present

## 2020-06-08 DIAGNOSIS — F4321 Adjustment disorder with depressed mood: Secondary | ICD-10-CM

## 2020-06-08 MED ORDER — NURTEC 75 MG PO TBDP: 1.0000 | ORAL_TABLET | Freq: Every day | ORAL | 0 refills | Status: DC | PRN

## 2020-06-08 NOTE — Patient Instructions (Signed)
Ms. Troeger , Thank you for taking time to come for your Medicare Wellness Visit. I appreciate your ongoing commitment to your health goals. Please review the following plan we discussed and let me know if I can assist you in the future.   These are the goals we discussed: Goals    . Blood Pressure < 130/80    . DIET - INCREASE WATER INTAKE     Try to get in 4-5 bottles of water daily     . Exercise 150 min/wk Moderate Activity    . LDL CALC < 70    . Weight (lb) < 145 lb (65.8 kg)       This is a list of the screening recommended for you and due dates:  Health Maintenance  Topic Date Due  . Flu Shot  06/19/2020*  . Hemoglobin A1C  06/26/2020  . Eye exam for diabetics  06/27/2020  . Complete foot exam   09/05/2020  . Tetanus Vaccine  04/05/2021  . Mammogram  11/07/2021  . Cologuard (Stool DNA test)  06/28/2022  . DEXA scan (bone density measurement)  Completed  . COVID-19 Vaccine  Completed  .  Hepatitis C: One time screening is recommended by Center for Disease Control  (CDC) for  adults born from [birth year] through 1967.   Completed  . Pneumonia vaccines  Completed  *Topic was postponed. The date shown is not the original due date.        Rimegepant oral dissolving tablet What is this medicine? RIMEGEPANT (ri ME je pant) is used to treat migraine headaches with or without aura. An aura is a strange feeling or visual disturbance that warns you of an attack. It is not used to prevent migraines. This medicine may be used for other purposes; ask your health care provider or pharmacist if you have questions. COMMON BRAND NAME(S): NURTEC ODT What should I tell my health care provider before I take this medicine? They need to know if you have any of these conditions:  kidney disease  liver disease  an unusual or allergic reaction to rimegepant, other medicines, foods, dyes, or preservatives  pregnant or trying to get pregnant  breast-feeding How should I use this  medicine? Take the medicine by mouth. Follow the directions on the prescription label. Leave the tablet in the sealed blister pack until you are ready to take it. With dry hands, open the blister and gently remove the tablet. If the tablet breaks or crumbles, throw it away and take a new tablet out of the blister pack. Place the tablet in the mouth and allow it to dissolve, and then swallow. Do not cut, crush, or chew this medicine. You do not need water to take this medicine. Talk to your pediatrician about the use of this medicine in children. Special care may be needed. Overdosage: If you think you have taken too much of this medicine contact a poison control center or emergency room at once. NOTE: This medicine is only for you. Do not share this medicine with others. What if I miss a dose? This does not apply. This medicine is not for regular use. What may interact with this medicine? This medicine may interact with the following medications:  certain medicines for fungal infections like fluconazole, itraconazole  rifampin This list may not describe all possible interactions. Give your health care provider a list of all the medicines, herbs, non-prescription drugs, or dietary supplements you use. Also tell them if you smoke, drink alcohol,  or use illegal drugs. Some items may interact with your medicine. What should I watch for while using this medicine? Visit your health care professional for regular checks on your progress. Tell your health care professional if your symptoms do not start to get better or if they get worse. What side effects may I notice from receiving this medicine? Side effects that you should report to your doctor or health care professional as soon as possible:  allergic reactions like skin rash, itching or hives; swelling of the face, lips, or tongue Side effects that usually do not require medical attention (report these to your doctor or health care professional if  they continue or are bothersome):  nausea This list may not describe all possible side effects. Call your doctor for medical advice about side effects. You may report side effects to FDA at 1-800-FDA-1088. Where should I keep my medicine? Keep out of the reach of children. Store at room temperature between 15 and 30 degrees C (59 and 86 degrees F). Throw away any unused medicine after the expiration date. NOTE: This sheet is a summary. It may not cover all possible information. If you have questions about this medicine, talk to your doctor, pharmacist, or health care provider.  2020 Elsevier/Gold Standard (2018-11-19 00:21:31)

## 2020-06-09 LAB — COMPLETE METABOLIC PANEL WITH GFR
AG Ratio: 1.8 (calc) (ref 1.0–2.5)
ALT: 9 U/L (ref 6–29)
AST: 14 U/L (ref 10–35)
Albumin: 4.2 g/dL (ref 3.6–5.1)
Alkaline phosphatase (APISO): 80 U/L (ref 37–153)
BUN/Creatinine Ratio: 17 (calc) (ref 6–22)
BUN: 9 mg/dL (ref 7–25)
CO2: 29 mmol/L (ref 20–32)
Calcium: 9.7 mg/dL (ref 8.6–10.4)
Chloride: 98 mmol/L (ref 98–110)
Creat: 0.52 mg/dL — ABNORMAL LOW (ref 0.60–0.93)
GFR, Est African American: 112 mL/min/{1.73_m2} (ref >=60)
GFR, Est Non African American: 97 mL/min/{1.73_m2} (ref >=60)
Globulin: 2.4 g/dL (calc) (ref 1.9–3.7)
Glucose, Bld: 120 mg/dL — ABNORMAL HIGH (ref 65–99)
Potassium: 3.4 mmol/L — ABNORMAL LOW (ref 3.5–5.3)
Sodium: 135 mmol/L (ref 135–146)
Total Bilirubin: 0.4 mg/dL (ref 0.2–1.2)
Total Protein: 6.6 g/dL (ref 6.1–8.1)

## 2020-06-09 LAB — CBC WITH DIFFERENTIAL/PLATELET
Absolute Monocytes: 570 cells/uL (ref 200–950)
Basophils Absolute: 62 cells/uL (ref 0–200)
Basophils Relative: 0.7 %
Eosinophils Absolute: 276 cells/uL (ref 15–500)
Eosinophils Relative: 3.1 %
HCT: 36 % (ref 35.0–45.0)
Hemoglobin: 11.9 g/dL (ref 11.7–15.5)
Lymphs Abs: 3088 cells/uL (ref 850–3900)
MCH: 27.5 pg (ref 27.0–33.0)
MCHC: 33.1 g/dL (ref 32.0–36.0)
MCV: 83.3 fL (ref 80.0–100.0)
MPV: 10.4 fL (ref 7.5–12.5)
Monocytes Relative: 6.4 %
Neutro Abs: 4904 cells/uL (ref 1500–7800)
Neutrophils Relative %: 55.1 %
Platelets: 238 10*3/uL (ref 140–400)
RBC: 4.32 10*6/uL (ref 3.80–5.10)
RDW: 13.1 % (ref 11.0–15.0)
Total Lymphocyte: 34.7 %
WBC: 8.9 10*3/uL (ref 3.8–10.8)

## 2020-06-09 LAB — LIPID PANEL
Cholesterol: 128 mg/dL (ref <200)
HDL: 42 mg/dL — ABNORMAL LOW (ref >=50)
LDL Cholesterol (Calc): 65 mg/dL (calc)
Non-HDL Cholesterol (Calc): 86 mg/dL (calc) (ref <130)
Total CHOL/HDL Ratio: 3 (calc) (ref <5.0)
Triglycerides: 133 mg/dL (ref <150)

## 2020-06-09 LAB — HEMOGLOBIN A1C
Hgb A1c MFr Bld: 6.1 % of total Hgb — ABNORMAL HIGH (ref <5.7)
Mean Plasma Glucose: 128 (calc)
eAG (mmol/L): 7.1 (calc)

## 2020-06-09 LAB — TSH: TSH: 2 mIU/L (ref 0.40–4.50)

## 2020-06-09 LAB — MAGNESIUM: Magnesium: 1.7 mg/dL (ref 1.5–2.5)

## 2020-06-10 ENCOUNTER — Other Ambulatory Visit: Payer: Self-pay | Admitting: Adult Health

## 2020-08-24 ENCOUNTER — Telehealth: Payer: Self-pay

## 2020-08-24 NOTE — Telephone Encounter (Signed)
Entered in Error

## 2020-09-04 NOTE — Progress Notes (Signed)
Complete Physical  Assessment and Plan:  Essential hypertension - continue medications, DASH diet, exercise and monitor at home. Call if greater than 130/80.  - CBC with Differential/Platelet - CMP/GFR - TSH - magnesium  - UA/microalbumin - EKG  Type 2 diabetes mellitus with sensory neuropathy ([place]) Discussed general issues about diabetes pathophysiology and management., Educational material distributed., Suggested low cholesterol diet., Encouraged aerobic exercise., Discussed foot care., Reminded to get yearly retinal exam.- report requested - diabetic foot exam  - Hemoglobin A1c  Diabetic sensory neuropathy ([place]) Declines medications at this time, mild, discussed feet checks. Follows with podiatry.   Hyperlipidemia -continue medications, check lipids, decrease fatty foods, increase activity.  - LDL goal <70; discussed increasing dose if remains above goal  - Lipid panel  Vitamin D deficiency Continue supplement, check at CPE   Medication management - Magnesium  Meniere's disease, unspecified laterality Continue HCTZ, monitor kidney function Follow up Dr. Lucia Gaskins  Fatty liver Weight loss advised, avoid alcohol/tylenol, will monitor LFTs  Environmental allegies Continue OTC antihistamine, singulair Hygiene reviewed  Overweight Long discussion about weight loss, diet, and exercise Recommended diet heavy in fruits and veggies and low in animal meats, cheeses, and dairy products, appropriate calorie intake Patient will work on increasing intentional activity Discussed appropriate weight for height  Phentermine - perceives benefit; denies SE; advised to stop for a few weeks for a drug break prior to restarting Follow up at next visit  Situational depression Declines meds, lifestyle reviewed; she plans to pursue counseling  CBT recommended, F/u if not improving, denies SI/HI  Need for influenza vaccine Out if office, get at pharmacy   Orders Placed This Encounter   Procedures   CBC with Differential/Platelet   COMPLETE METABOLIC PANEL WITH GFR   Magnesium   Lipid panel   TSH   Hemoglobin A1c   VITAMIN D 25 Hydroxy (Vit-D Deficiency, Fractures)   Microalbumin / creatinine urine ratio   Urinalysis, Routine w reflex microscopic   Iron, TIBC and Ferritin Panel   Vitamin B12   EKG 12-Lead   HM DIABETES FOOT EXAM    Discussed med's effects and SE's. Screening labs and tests as requested with regular follow-up as recommended. Over 40 minutes of exam, counseling, chart review, and complex, high level critical decision making was performed this visit.   Future Appointments  Date Time Provider [place]  09/07/2021 10:00 AM [name], NP [facility] None     HPI  69 y.o. female  presents for a complete physical and follow up for has Hyperlipidemia associated with type 2 diabetes mellitus ([place]); Hypertension; Vitamin D deficiency; Meniere's disease; Medication management; Overweight (BMI 25.0-29.9); Type 2 diabetes mellitus with sensory neuropathy ([place]); Fatty liver; CKD stage 2 due to type 1 diabetes mellitus ([place]); Environmental allergies; Situational depression; and Neuropathy due to type 2 diabetes mellitus ([place]) on their problem list.   She is married, recent 27 year anniversary, 3 sons, 1 passed away. Retired Web designer. Has grand kids nearby and enjoys spending time with them. Sister passed last year due to metastatic kidney cancer. Feels she is managing mood fairly, a bit down around the holidays as they were very close, she is taking melatonin 3 mg for sleep which is is working well.   Has had some occasional ophthalmic migraines - 3 in last year. Rest and ibuprofen helps.   She has seen Dr. Lucia Gaskins for her meniere's, she is back on her HCTZ.  she is prescribed phentermine for weight loss. Taking 1/2 tab  only if needed, not in several months. While on the medication they have lost 0 lbs since last visit. They  deny palpitations, anxiety, trouble sleeping, elevated BP.   BMI is Body mass index is 29 kg/m., she is working on diet, generally active not intentionally exercise. Working to increase water intake, up to 3 bottles.  Wt Readings from Last 3 Encounters:  09/07/20 143 lb 9.6 oz (65.1 kg)  06/08/20 136 lb 9.6 oz (62 kg)  12/26/19 142 lb 9.6 oz (64.7 kg)   Her blood pressure has been controlled at home, today their BP is BP: 116/64 She does not workout. She denies chest pain, shortness of breath, dizziness.   She is on cholesterol medication (rosuvastatin 10 mg daily) and denies myalgias. Her cholesterol is at goal. The cholesterol last visit was:   Lab Results  Component Value Date   CHOL 128 06/08/2020   HDL 42 (L) 06/08/2020   LDLCALC 65 06/08/2020   TRIG 133 06/08/2020   CHOLHDL 3.0 06/08/2020   She has been working on diet and exercise for T2DM with neuroapthy (tingling, uses topical creams, follows with podiatry) and CKD, she is on bASA, she is on ACE/ARB and denies foot ulcerations, hyperglycemia, hypoglycemia, nausea, paresthesia of the feet, polydipsia, polyuria and visual disturbances.  Metformin 3 tabs daily, was having diarrhea with 4 tabs.  Last A1C in the office was:  Lab Results  Component Value Date   HGBA1C 6.1 (H) 06/08/2020   She has CKD I/II associated with T2DM and htn monitored q64m via this office:  Lab Results  Component Value Date   GFRNONAA 97 06/08/2020   Patient is on Vitamin D supplement, taking 6000 IU daily.   Lab Results  Component Value Date   VD25OH 78 09/06/2019      She reports hx of iron def anemia and has been on slow release iron daily for many years.  No results found for: IRON, TIBC, FERRITIN  She is taking 1000 mcg tab daily for B12 Lab Results  Component Value Date   AJGOTLXB26 203 09/26/2018     Current Medications:  Current Outpatient Medications on File Prior to Visit  Medication Sig Dispense Refill   aspirin 81 MG  chewable tablet Chew 81 mg by mouth every other day.     BAYER MICROLET LANCETS lancets Use as instructed 100 each 12   bisoprolol-hydrochlorothiazide (ZIAC) 5-6.25 MG tablet Take 1 tablet Daily for BP 90 tablet 3   Blood Glucose Monitoring Suppl (CONTOUR BLOOD GLUCOSE SYSTEM) DEVI Test Blood sugar once daily 1 Device 0   cetirizine (ZYRTEC) 10 MG tablet Take 10 mg by mouth daily.     Cholecalciferol (VITAMIN D PO) Take 6,000 Int'l Units by mouth daily.      ferrous sulfate dried (SLOW FE) 160 (50 FE) MG TBCR SR tablet Take 160 mg by mouth daily.     glucose blood (ONETOUCH ULTRA) test strip CHECK BLOOD SUGAR DAILY 100 strip 3   hydrochlorothiazide (HYDRODIURIL) 25 MG tablet TAKE 1 TABLET DAILY FOR BP & FLUID RETENTION (ANKLE SWELLING) 90 tablet 3   Magnesium 500 MG CAPS Take 500 mg by mouth 3 (three) times daily.      meclizine (ANTIVERT) 25 MG tablet Take 1 tablet 3 x / day as needed for Dizziness 30 tablet 2   melatonin 3 MG TABS tablet Take 3 mg by mouth at bedtime.     olmesartan (BENICAR) 40 MG tablet TAKE ONE TABLET BY MOUTH DAILY FOR  BLOOD PRESSURE 90 tablet 1   promethazine (PHENERGAN) 25 MG tablet Take 1 tablet every 4 hours if needed for nausea or dizziness 30 tablet 2   Rimegepant Sulfate (NURTEC) 75 MG TBDP Take 1 tablet by mouth daily as needed. With onset of migraine symptoms. 2 tablet 0   rosuvastatin (CRESTOR) 10 MG tablet Take    1 tablet    Daily     for Cholesterol 90 tablet 0   vitamin B-12 (CYANOCOBALAMIN) 1000 MCG tablet Take 1,000 mcg by mouth daily.     No current facility-administered medications on file prior to visit.   Allergies:  Allergies  Allergen Reactions   Penicillins Other (See Comments)   Lisinopril Cough   Topamax [Topiramate] Other (See Comments)    Affected mood, lathargic   Medical History:  She has Hyperlipidemia associated with type 2 diabetes mellitus ([place]); Hypertension; Vitamin D deficiency; Meniere's disease; Medication  management; Overweight (BMI 25.0-29.9); Type 2 diabetes mellitus with sensory neuropathy ([place]); Fatty liver; CKD stage 2 due to type 1 diabetes mellitus ([place]); Environmental allergies; Situational depression; and Neuropathy due to type 2 diabetes mellitus ([place]) on their problem list. Health Maintenance:   Immunization History  Administered Date(s) Administered   Influenza Split 07/16/2013, 07/24/2014   Influenza, High Dose Seasonal PF 06/15/2015, 07/04/2016, 07/14/2017, 06/19/2018, 06/03/2019   PFIZER SARS-COV-2 Vaccination 11/09/2019, 12/03/2019, 08/21/2020   PPD Test 04/14/2014   Pneumococcal Conjugate-13 10/31/2014   Pneumococcal Polysaccharide-23 12/01/2004, 03/23/2016   Tdap 04/06/2011   Zoster 07/16/2013    Preventative care: Last colonoscopy: 2010 Last cologuard: 06/2019 negative Last mammogram: 10/2019,  Last pap smear/pelvic exam: remote, declines another DEXA: 2019 - normal  CXR:11/2010 [place] AB 2014  Prior vaccinations: TD or Tdap: 2012  Influenza: 05/2019 DUE - get at pharmacy, out in office Pneumococcal: 2017 Prevnar13: 2016 Shingles/Zostavax: 2014 Covid 19: 3/3, 2021, pfizer  Names of Other Physician/Practitioners you currently use: 1. [facility]- here for primary care 2. [facility], Dr. Willette Brace, eye doctor, last visit summer 2021 3. Dr. Bjorn Pippin, dentist, last visit 2021  Patient Care Team: [name], MD as PCP - General (Internal Medicine) Inda Castle, MD (Inactive) as Consulting Physician (Gastroenterology) Rozetta Nunnery, MD as Consulting Physician (Otolaryngology) Rona Ravens, [facility] (Optometry) Justice Britain, MD as Consulting Physician (Orthopedic Surgery) Evelina Bucy, DPM as Consulting Physician (Podiatry)  Surgical History:  She has a past surgical history that includes ORIF ankle fracture (Left, 2000); Abdominal hysterectomy (1998); and Shoulder surgery (Right, 02/2017). Family  History:  Herfamily history includes Alcoholism in her mother; Autism in her brother; Breast cancer (age of onset: 27) in her paternal aunt; COPD in her brother; Diabetes in her father; Heart disease in her father and son; Hypertension in her father; Kidney cancer (age of onset: 22) in her sister; Kidney disease in her son; Liver cancer in her maternal grandmother; Lung cancer in her brother; Pancreatic cancer in her mother. Social History:  She reports that she has never smoked. She has never used smokeless tobacco. She reports current alcohol use. She reports that she does not use drugs.  Review of Systems: Review of Systems  Constitutional: Negative for malaise/fatigue and weight loss.  HENT: Negative for hearing loss and tinnitus.   Eyes: Negative for blurred vision and double vision.  Respiratory: Negative for cough, shortness of breath and wheezing.   Cardiovascular: Negative for chest pain, palpitations, orthopnea, claudication and leg swelling.  Gastrointestinal: Negative for abdominal pain, blood in stool,  constipation, diarrhea, heartburn, melena, nausea and vomiting.  Genitourinary: Negative.   Musculoskeletal: Negative for joint pain and myalgias.  Skin: Negative for rash.  Neurological: Negative for dizziness, tingling, sensory change, weakness and headaches.  Endo/Heme/Allergies: Negative for environmental allergies and polydipsia.  Psychiatric/Behavioral: Positive for depression. Negative for hallucinations, memory loss, substance abuse and suicidal ideas. The patient is not nervous/anxious and does not have insomnia.   All other systems reviewed and are negative.   Physical Exam: Estimated body mass index is 29 kg/m as calculated from the following:   Height as of this encounter: 4\' 11"  (1.499 m).   Weight as of this encounter: 143 lb 9.6 oz (65.1 kg). BP 116/64    Pulse 80    Temp (!) 96.8 F (36 C)    Ht 4\' 11"  (1.499 m)    Wt 143 lb 9.6 oz (65.1 kg)    SpO2 99%    BMI  29.00 kg/m  General Appearance: Well nourished, in no apparent distress.  Eyes: PERRLA, EOMs, conjunctiva no swelling or erythema Sinuses: No Frontal/maxillary tenderness  ENT/Mouth: Ext aud canals clear, normal light reflex with TMs without erythema, bulging. Good dentition. No erythema, swelling, or exudate on post pharynx. Tonsils not swollen or erythematous. Hearing normal.  Neck: Supple, thyroid normal. No bruits  Respiratory: Respiratory effort normal, BS equal bilaterally without rales, rhonchi, wheezing or stridor.  Cardio: RRR without murmurs, rubs or gallops. Brisk peripheral pulses without edema.  Chest: symmetric, with normal excursions and percussion.  Breasts: Irregular fibrous texture throughout, without distinct lumps, nipple discharge, retractions.  Abdomen: Soft, nontender, no guarding, rebound, hernias, masses, or organomegaly.  Lymphatics: Non tender without lymphadenopathy.  Genitourinary: Defer Musculoskeletal: Full ROM all peripheral extremities,5/5 strength, and normal gait.  Skin: Warm, dry without rashes, lesions, ecchymosis. Neuro: Cranial nerves intact, reflexes equal bilaterally. Normal muscle tone, no cerebellar symptoms. Sensation intact 10/10 in feet to monofilament, mildly reduced sensation reported dorsally on L  Psych: Awake and oriented X 3, normal affect, Insight and Judgment appropriate.   EKG: NSR with PAC, no ST changes  Izora Ribas 9:39 AM [facility]

## 2020-09-07 ENCOUNTER — Other Ambulatory Visit: Payer: Self-pay

## 2020-09-07 ENCOUNTER — Encounter: Payer: Self-pay | Admitting: Adult Health

## 2020-09-07 ENCOUNTER — Ambulatory Visit (INDEPENDENT_AMBULATORY_CARE_PROVIDER_SITE_OTHER): Payer: Medicare HMO | Admitting: Adult Health

## 2020-09-07 VITALS — BP 116/64 | HR 80 | Temp 96.8°F | Ht 59.0 in | Wt 143.6 lb

## 2020-09-07 DIAGNOSIS — E1169 Type 2 diabetes mellitus with other specified complication: Secondary | ICD-10-CM | POA: Diagnosis not present

## 2020-09-07 DIAGNOSIS — H8109 Meniere's disease, unspecified ear: Secondary | ICD-10-CM

## 2020-09-07 DIAGNOSIS — Z79899 Other long term (current) drug therapy: Secondary | ICD-10-CM

## 2020-09-07 DIAGNOSIS — N182 Chronic kidney disease, stage 2 (mild): Secondary | ICD-10-CM

## 2020-09-07 DIAGNOSIS — I10 Essential (primary) hypertension: Secondary | ICD-10-CM | POA: Diagnosis not present

## 2020-09-07 DIAGNOSIS — E559 Vitamin D deficiency, unspecified: Secondary | ICD-10-CM | POA: Diagnosis not present

## 2020-09-07 DIAGNOSIS — E1022 Type 1 diabetes mellitus with diabetic chronic kidney disease: Secondary | ICD-10-CM

## 2020-09-07 DIAGNOSIS — K760 Fatty (change of) liver, not elsewhere classified: Secondary | ICD-10-CM | POA: Diagnosis not present

## 2020-09-07 DIAGNOSIS — Z136 Encounter for screening for cardiovascular disorders: Secondary | ICD-10-CM | POA: Diagnosis not present

## 2020-09-07 DIAGNOSIS — Z0000 Encounter for general adult medical examination without abnormal findings: Secondary | ICD-10-CM

## 2020-09-07 DIAGNOSIS — D649 Anemia, unspecified: Secondary | ICD-10-CM | POA: Diagnosis not present

## 2020-09-07 DIAGNOSIS — Z1329 Encounter for screening for other suspected endocrine disorder: Secondary | ICD-10-CM

## 2020-09-07 DIAGNOSIS — Z9109 Other allergy status, other than to drugs and biological substances: Secondary | ICD-10-CM

## 2020-09-07 DIAGNOSIS — E785 Hyperlipidemia, unspecified: Secondary | ICD-10-CM | POA: Diagnosis not present

## 2020-09-07 DIAGNOSIS — E1140 Type 2 diabetes mellitus with diabetic neuropathy, unspecified: Secondary | ICD-10-CM

## 2020-09-07 DIAGNOSIS — E663 Overweight: Secondary | ICD-10-CM

## 2020-09-07 DIAGNOSIS — F4321 Adjustment disorder with depressed mood: Secondary | ICD-10-CM

## 2020-09-07 MED ORDER — METFORMIN HCL ER 500 MG PO TB24
ORAL_TABLET | ORAL | 1 refills | Status: DC
Start: 2020-09-07 — End: 2021-05-28

## 2020-09-07 MED ORDER — PHENTERMINE HCL 37.5 MG PO TABS
ORAL_TABLET | ORAL | 0 refills | Status: DC
Start: 2020-09-07 — End: 2021-06-17

## 2020-09-07 NOTE — Patient Instructions (Addendum)
Regina Cervantes , Thank you for taking time to come for your Annual Wellness Visit. I appreciate your ongoing commitment to your health goals. Please review the following plan we discussed and let me know if I can assist you in the future.   These are the goals we discussed: Goals    . Blood Pressure < 130/80    . DIET - INCREASE WATER INTAKE     Try to get in 4-5 bottles of water daily     . Exercise 150 min/wk Moderate Activity    . LDL CALC < 70    . Weight (lb) < 140 lb (63.5 kg)       This is a list of the screening recommended for you and due dates:  Health Maintenance  Topic Date Due  . Flu Shot  04/19/2020  . Eye exam for diabetics  06/27/2020  . Hemoglobin A1C  12/06/2020  . Tetanus Vaccine  04/05/2021  . Complete foot exam   09/07/2021  . Mammogram  11/07/2021  . Cologuard (Stool DNA test)  06/28/2022  . DEXA scan (bone density measurement)  Completed  . COVID-19 Vaccine  Completed  .  Hepatitis C: One time screening is recommended by Center for Disease Control  (CDC) for  adults born from [birth year] through 1967.   Completed  . Pneumonia vaccines  Completed     Know what a healthy weight is for you (roughly BMI <25) and aim to maintain this  Aim for 7+ servings of fruits and vegetables daily  65-80+ fluid ounces of water or unsweet tea for healthy kidneys  Limit to max 1 drink of alcohol per day; avoid smoking/tobacco  Limit animal fats in diet for cholesterol and heart health - choose grass fed whenever available  Avoid highly processed foods, and foods high in saturated/trans fats  Aim for low stress - take time to unwind and care for your mental health  Aim for 150 min of moderate intensity exercise weekly for heart health, and weights twice weekly for bone health  Aim for 7-9 hours of sleep daily       High-Fiber Diet Fiber, also called dietary fiber, is a type of carbohydrate that is found in fruits, vegetables, whole grains, and beans. A high-fiber  diet can have many health benefits. Your health care provider may recommend a high-fiber diet to help:  Prevent constipation. Fiber can make your bowel movements more regular.  Lower your cholesterol.  Relieve the following conditions: ? Swelling of veins in the anus (hemorrhoids). ? Swelling and irritation (inflammation) of specific areas of the digestive tract (uncomplicated diverticulosis). ? A problem of the large intestine (colon) that sometimes causes pain and diarrhea (irritable bowel syndrome, IBS).  Prevent overeating as part of a weight-loss plan.  Prevent heart disease, type 2 diabetes, and certain cancers. What is my plan? The recommended daily fiber intake in grams (g) includes:  38 g for men age 93 or younger.  30 g for men over age 74.  60 g for women age 35 or younger.  21 g for women over age 55. You can get the recommended daily intake of dietary fiber by:  Eating a variety of fruits, vegetables, grains, and beans.  Taking a fiber supplement, if it is not possible to get enough fiber through your diet. What do I need to know about a high-fiber diet?  It is better to get fiber through food sources rather than from fiber supplements. There is not a  lot of research about how effective supplements are.  Always check the fiber content on the nutrition facts label of any prepackaged food. Look for foods that contain 5 g of fiber or more per serving.  Talk with a diet and nutrition specialist (dietitian) if you have questions about specific foods that are recommended or not recommended for your medical condition, especially if those foods are not listed below.  Gradually increase how much fiber you consume. If you increase your intake of dietary fiber too quickly, you may have bloating, cramping, or gas.  Drink plenty of water. Water helps you to digest fiber. What are tips for following this plan?  Eat a wide variety of high-fiber foods.  Make sure that half of  the grains that you eat each day are whole grains.  Eat breads and cereals that are made with whole-grain flour instead of refined flour or white flour.  Eat brown rice, bulgur wheat, or millet instead of white rice.  Start the day with a breakfast that is high in fiber, such as a cereal that contains 5 g of fiber or more per serving.  Use beans in place of meat in soups, salads, and pasta dishes.  Eat high-fiber snacks, such as berries, raw vegetables, nuts, and popcorn.  Choose whole fruits and vegetables instead of processed forms like juice or sauce. What foods can I eat?  Fruits Berries. Pears. Apples. Oranges. Avocado. Prunes and raisins. Dried figs. Vegetables Sweet potatoes. Spinach. Kale. Artichokes. Cabbage. Broccoli. Cauliflower. Green peas. Carrots. Squash. Grains Whole-grain breads. Multigrain cereal. Oats and oatmeal. Brown rice. Barley. Bulgur wheat. [place]. Quinoa. Bran muffins. Popcorn. Rye wafer crackers. Meats and other proteins Navy, kidney, and pinto beans. Soybeans. Split peas. Lentils. Nuts and seeds. Dairy Fiber-fortified yogurt. Beverages Fiber-fortified soy milk. Fiber-fortified orange juice. Other foods Fiber bars. The items listed above may not be a complete list of recommended foods and beverages. Contact a dietitian for more options. What foods are not recommended? Fruits Fruit juice. Cooked, strained fruit. Vegetables Fried potatoes. Canned vegetables. Well-cooked vegetables. Grains White bread. Pasta made with refined flour. White rice. Meats and other proteins Fatty cuts of meat. Fried chicken or fried fish. Dairy Milk. Yogurt. Cream cheese. Sour cream. Fats and oils Butters. Beverages Soft drinks. Other foods Cakes and pastries. The items listed above may not be a complete list of foods and beverages to avoid. Contact a dietitian for more information. Summary  Fiber is a type of carbohydrate. It is found in fruits, vegetables, whole  grains, and beans.  There are many health benefits of eating a high-fiber diet, such as preventing constipation, lowering blood cholesterol, helping with weight loss, and reducing your risk of heart disease, diabetes, and certain cancers.  Gradually increase your intake of fiber. Increasing too fast can result in cramping, bloating, and gas. Drink plenty of water while you increase your fiber.  The best sources of fiber include whole fruits and vegetables, whole grains, nuts, seeds, and beans. This information is not intended to replace advice given to you by your health care provider. Make sure you discuss any questions you have with your health care provider. Document Revised: 07/10/2017 Document Reviewed: 07/10/2017 Elsevier Patient Education  2020 [employer].

## 2020-09-08 LAB — COMPLETE METABOLIC PANEL WITH GFR
AG Ratio: 1.6 (calc) (ref 1.0–2.5)
ALT: 9 U/L (ref 6–29)
AST: 16 U/L (ref 10–35)
Albumin: 4.4 g/dL (ref 3.6–5.1)
Alkaline phosphatase (APISO): 74 U/L (ref 37–153)
BUN: 12 mg/dL (ref 7–25)
CO2: 29 mmol/L (ref 20–32)
Calcium: 10.1 mg/dL (ref 8.6–10.4)
Chloride: 99 mmol/L (ref 98–110)
Creat: 0.67 mg/dL (ref 0.60–0.93)
GFR, Est African American: 102 mL/min/{1.73_m2} (ref >=60)
GFR, Est Non African American: 88 mL/min/{1.73_m2} (ref >=60)
Globulin: 2.7 g/dL (calc) (ref 1.9–3.7)
Glucose, Bld: 136 mg/dL — ABNORMAL HIGH (ref 65–99)
Potassium: 3.8 mmol/L (ref 3.5–5.3)
Sodium: 137 mmol/L (ref 135–146)
Total Bilirubin: 0.5 mg/dL (ref 0.2–1.2)
Total Protein: 7.1 g/dL (ref 6.1–8.1)

## 2020-09-08 LAB — URINALYSIS, ROUTINE W REFLEX MICROSCOPIC
Bacteria, UA: NONE SEEN /HPF
Bilirubin Urine: NEGATIVE
Glucose, UA: NEGATIVE
Hgb urine dipstick: NEGATIVE
Hyaline Cast: NONE SEEN /LPF
Ketones, ur: NEGATIVE
Nitrite: NEGATIVE
Protein, ur: NEGATIVE
RBC / HPF: NONE SEEN /HPF (ref 0–2)
Specific Gravity, Urine: 1.009 (ref 1.001–1.03)
pH: 7.5 (ref 5.0–8.0)

## 2020-09-08 LAB — HEMOGLOBIN A1C
Hgb A1c MFr Bld: 6.3 % of total Hgb — ABNORMAL HIGH (ref <5.7)
Mean Plasma Glucose: 134 mg/dL
eAG (mmol/L): 7.4 mmol/L

## 2020-09-08 LAB — IRON,TIBC AND FERRITIN PANEL
%SAT: 19 % (calc) (ref 16–45)
Ferritin: 21 ng/mL (ref 16–288)
Iron: 77 ug/dL (ref 45–160)
TIBC: 395 mcg/dL (calc) (ref 250–450)

## 2020-09-08 LAB — CBC WITH DIFFERENTIAL/PLATELET
Absolute Monocytes: 548 cells/uL (ref 200–950)
Basophils Absolute: 58 cells/uL (ref 0–200)
Basophils Relative: 0.7 %
Eosinophils Absolute: 208 cells/uL (ref 15–500)
Eosinophils Relative: 2.5 %
HCT: 37 % (ref 35.0–45.0)
Hemoglobin: 12.2 g/dL (ref 11.7–15.5)
Lymphs Abs: 2714 cells/uL (ref 850–3900)
MCH: 27.2 pg (ref 27.0–33.0)
MCHC: 33 g/dL (ref 32.0–36.0)
MCV: 82.6 fL (ref 80.0–100.0)
MPV: 10.8 fL (ref 7.5–12.5)
Monocytes Relative: 6.6 %
Neutro Abs: 4773 cells/uL (ref 1500–7800)
Neutrophils Relative %: 57.5 %
Platelets: 245 10*3/uL (ref 140–400)
RBC: 4.48 10*6/uL (ref 3.80–5.10)
RDW: 13.5 % (ref 11.0–15.0)
Total Lymphocyte: 32.7 %
WBC: 8.3 10*3/uL (ref 3.8–10.8)

## 2020-09-08 LAB — MAGNESIUM: Magnesium: 1.8 mg/dL (ref 1.5–2.5)

## 2020-09-08 LAB — VITAMIN B12: Vitamin B-12: 559 pg/mL (ref 200–1100)

## 2020-09-08 LAB — VITAMIN D 25 HYDROXY (VIT D DEFICIENCY, FRACTURES): Vit D, 25-Hydroxy: 90 ng/mL (ref 30–100)

## 2020-09-08 LAB — LIPID PANEL
Cholesterol: 146 mg/dL (ref <200)
HDL: 51 mg/dL (ref >=50)
LDL Cholesterol (Calc): 73 mg/dL (calc)
Non-HDL Cholesterol (Calc): 95 mg/dL (calc) (ref <130)
Total CHOL/HDL Ratio: 2.9 (calc) (ref <5.0)
Triglycerides: 139 mg/dL (ref <150)

## 2020-09-08 LAB — MICROALBUMIN / CREATININE URINE RATIO
Creatinine, Urine: 40 mg/dL (ref 20–275)
Microalb Creat Ratio: 10 mcg/mg creat (ref <30)
Microalb, Ur: 0.4 mg/dL

## 2020-09-08 LAB — TSH: TSH: 2.34 mIU/L (ref 0.40–4.50)

## 2020-09-14 DIAGNOSIS — R69 Illness, unspecified: Secondary | ICD-10-CM | POA: Diagnosis not present

## 2020-09-23 ENCOUNTER — Other Ambulatory Visit: Payer: Self-pay | Admitting: Internal Medicine

## 2020-10-19 ENCOUNTER — Other Ambulatory Visit: Payer: Self-pay | Admitting: Internal Medicine

## 2020-10-19 DIAGNOSIS — Z1231 Encounter for screening mammogram for malignant neoplasm of breast: Secondary | ICD-10-CM

## 2020-10-22 ENCOUNTER — Other Ambulatory Visit: Payer: Self-pay | Admitting: Internal Medicine

## 2020-10-22 ENCOUNTER — Ambulatory Visit: Payer: Medicare HMO | Admitting: Podiatry

## 2020-10-29 ENCOUNTER — Ambulatory Visit: Payer: Medicare HMO | Admitting: Podiatry

## 2020-10-29 ENCOUNTER — Other Ambulatory Visit: Payer: Self-pay

## 2020-10-29 ENCOUNTER — Encounter: Payer: Self-pay | Admitting: Podiatry

## 2020-10-29 DIAGNOSIS — B351 Tinea unguium: Secondary | ICD-10-CM

## 2020-10-29 DIAGNOSIS — G5761 Lesion of plantar nerve, right lower limb: Secondary | ICD-10-CM | POA: Diagnosis not present

## 2020-10-29 DIAGNOSIS — L600 Ingrowing nail: Secondary | ICD-10-CM | POA: Diagnosis not present

## 2020-11-23 ENCOUNTER — Ambulatory Visit: Payer: Medicare HMO | Admitting: Podiatry

## 2020-11-23 ENCOUNTER — Other Ambulatory Visit: Payer: Self-pay

## 2020-11-23 DIAGNOSIS — M205X2 Other deformities of toe(s) (acquired), left foot: Secondary | ICD-10-CM

## 2020-11-23 DIAGNOSIS — R42 Dizziness and giddiness: Secondary | ICD-10-CM

## 2020-11-23 DIAGNOSIS — G5761 Lesion of plantar nerve, right lower limb: Secondary | ICD-10-CM

## 2020-11-23 MED ORDER — PROMETHAZINE HCL 25 MG PO TABS: ORAL_TABLET | ORAL | 2 refills | Status: DC

## 2020-11-23 MED ORDER — BETAMETHASONE SOD PHOS & ACET 6 (3-3) MG/ML IJ SUSP
6.0000 mg | Freq: Once | INTRAMUSCULAR | Status: AC
  Administered 2020-11-23: 6 mg

## 2020-11-23 MED ORDER — MECLIZINE HCL 25 MG PO TABS: ORAL_TABLET | ORAL | 2 refills | Status: DC

## 2020-11-23 NOTE — Progress Notes (Signed)
  Subjective:  Patient ID: Regina Cervantes, female    DOB: 1951-02-12,  MRN: [id number]  Chief Complaint  Patient presents with  . Nail Problem    I have some toenails that have some crusty stuff under them  . Foot Problem    I have some numbness and it is on the right foot and has been going on for about 3 months     70 y.o. female presents with the above complaint. History confirmed with patient.   Objective:  Physical Exam: warm, good capillary refill, no trophic changes or ulcerative lesions, normal DP and PT pulses and normal sensory exam. Nails thickened and dystrophic Right Foot: POP 3rd interspace with Mulder's click  Assessment:   1. Morton neuroma, right   2. Onychomycosis   3. Ingrown nail      Plan:  Patient was evaluated and treated and all questions answered.  Morton Neuroma -Educated on etiology -Educated on padding and proper shoegear -XR reviewed with patient -Injection delivered to the affected interspaces  Procedure: Neuroma Injection Location: Right 3rd interspace Skin Prep: Alcohol. Injectate: 0.5 cc 0.5% marcaine plain, 0.5 cc betamethasone acetate-betamethasone sodium phosphate Disposition: Patient tolerated procedure well. Injection site dressed with a band-aid.  Onychomycosis -Nails debrided to patient relief.  No follow-ups on file.

## 2020-11-23 NOTE — Progress Notes (Signed)
  Subjective:  Patient ID: Regina Cervantes, female    DOB: [birth year],  MRN: [id number]  No chief complaint on file.   70 y.o. female presents with the above complaint. States the injection helped and the numbness is better. Still having pain at the bottom of hte right great toe with a callus at the area.  Objective:  Physical Exam: warm, good capillary refill, no trophic changes or ulcerative lesions, normal DP and PT pulses and normal sensory exam. Nails thickened and dystrophic Right Foot: POP 3rd interspace with Mulder's click; Right hallux plantar IPJ callus with POP Assessment:   1. Morton neuroma, right   2. Hallux extensus, acquired, left    Plan:  Patient was evaluated and treated and all questions answered.  Morton Neuroma -Improved, No injection today  Hallux Extensus, IPJ callus -Debrided lesion, courtesy. -Educated on self-care. -Dispensed toe cap -F/u should issues persist. Discussed correction of the hammertoe possible interphalangeal sesamoid excision if present.  No follow-ups on file.

## 2020-11-25 ENCOUNTER — Other Ambulatory Visit: Payer: Self-pay

## 2020-11-25 DIAGNOSIS — I10 Essential (primary) hypertension: Secondary | ICD-10-CM

## 2020-11-25 MED ORDER — OLMESARTAN MEDOXOMIL 40 MG PO TABS: ORAL_TABLET | ORAL | 1 refills | Status: DC

## 2020-11-26 ENCOUNTER — Other Ambulatory Visit: Payer: Self-pay | Admitting: Adult Health

## 2020-11-26 ENCOUNTER — Telehealth: Payer: Self-pay

## 2020-11-26 NOTE — Telephone Encounter (Signed)
Patient is requesting a referral to a new ENT Specialist, her's is retiring.

## 2020-12-07 ENCOUNTER — Inpatient Hospital Stay: Admission: RE | Admit: 2020-12-07 | Payer: Medicare HMO | Source: Ambulatory Visit

## 2020-12-18 ENCOUNTER — Other Ambulatory Visit: Payer: Self-pay | Admitting: Internal Medicine

## 2021-01-08 NOTE — Progress Notes (Signed)
MEDICARE ANNUAL WELLNESS VISIT AND FOLLOW UP  Assessment:    Medicare annual wellness visit Due annually   Essential hypertension - continue medications, DASH diet, exercise and monitor at home. Call if greater than 130/80.  - CBC with Differential/Platelet - CMP/GFR - TSH  Type 2 diabetes mellitus with sensory neuropathy ([place]) Discussed general issues about diabetes pathophysiology and management., Educational material distributed., Suggested low cholesterol diet., Encouraged aerobic exercise., Discussed foot care., Reminded to get yearly retinal exam.- report requested - Hemoglobin A1c  Hyperlipidemia associated with T2DM ([place]) -continue medications, check lipids, decrease fatty foods, increase activity.  - LDL goal <70; consider switch agents if remains above goal  - Lipid panel  CKD 2 associated with T2DM ([place]) On ACEi; Increase fluids, avoid NSAIDS, monitor sugars, will monitor - CMP/GFR  Neuropathy associated with T2DM ([facility]) Control sugars, check feet daily.   Vitamin D deficiency Continue supplement, check at CPE   Medication management - Magnesium  Meniere's disease, unspecified laterality Continue HCTZ, monitor kidney function Follow up Dr. Lucia Gaskins  Overweight with comorbidities - long discussion about weight loss, diet, and exercise Doing well with phentermine PRN; reminded to take routine drug break  Fatty liver Weight loss advised, avoid alcohol/tylenol, will monitor LFTs  Environmental allegies Continue OTC antihistamine, singulair Hygiene reviewed  Situational depression Declines meds, managing well with lifestyle   Ophthalmic migraine Occasional, stress related; doing well with conservative therapy - rest/ibuprofen   Over 30 minutes of exam, counseling, chart review, and critical decision making was performed  Future Appointments  Date Time Provider [place]  01/28/2021  8:20 AM [facility] 2 [facility]  04/13/2021  9:30 AM  [name], MD [facility] None  09/07/2021 10:00 AM [name], NP [facility] None  01/11/2022  9:30 AM [name], NP [facility] None    Plan:   During the course of the visit the patient was educated and counseled about appropriate screening and preventive services including:    Pneumococcal vaccine   Influenza vaccine  Td vaccine  Prevnar 13  Screening electrocardiogram  Screening mammography  Bone densitometry screening  Colorectal cancer screening  Diabetes screening  Glaucoma screening  Nutrition counseling   Advanced directives: given info/requested copies   Subjective:   Regina Cervantes is a 70 y.o. female who presents for Medicare Annual Wellness Visit and 3 month follow up on hypertension, diabetes with DM neuropathy, hyperlipidemia, vitamin D def.  She reports she has a lot going on in the family, sister passed due to metastatic kidney cancer. Brother was just notified of distal esophageal "lump" - having diagnostic procedure this week. Feels she is managing mood fairly, she is taking melatonin 3 mg for sleep which is is working well. Has had some occasional ophthalmic migraines - 1 in last month. Rest and ibuprofen helps.   She has seen Dr. Lucia Gaskins for her meniere's, she is back on her HCTZ.  she is prescribed phentermine for weight loss. Taking 1/2 tab only. Recently took an extended break. While on the medication they have lost 2 lbs since last visit. They deny palpitations, anxiety, trouble sleeping, elevated BP.   BMI is Body mass index is 28.48 kg/m., she is working on diet and exercise, doing walking 2 days a week, generally active on her feel all day. Watches portions.  Wt Readings from Last 3 Encounters:  01/11/21 141 lb (64 kg)  09/07/20 143 lb 9.6 oz (65.1 kg)  06/08/20 136 lb 9.6 oz (62 kg)   Her blood  pressure has been controlled at home, today their BP is BP: 122/68  She does workout, generally active. She denies chest pain,  shortness of breath, dizziness.    She is on cholesterol medication (rosuvastatain 10 mg daily) and denies myalgias. Her cholesterol is not at goal. The cholesterol last visit was:   Lab Results  Component Value Date   CHOL 146 09/07/2020   HDL 51 09/07/2020   LDLCALC 73 09/07/2020   TRIG 139 09/07/2020   CHOLHDL 2.9 09/07/2020    She was first diagnosed with DM 10-15 years ago, has been working on diet and exercise for diabetes with DM neuropathy (tingling, manages with topicals, podiatry following) and CKD II on ACEi, she has done a great job bringing down her A1C with weight loss/phentermine, she is on metformin, bASA, and denies polydipsia, polyuria and visual disturbances. Taking metformin 500 mg 3 tabs/day due to GI sx. She does check fasting sugars occasionally, ranging 90-125. Last A1C in the office was:  Lab Results  Component Value Date   HGBA1C 6.3 (H) 09/07/2020    CKD II associated with T2DM, monitored at this office. Last GFR:  Lab Results  Component Value Date   GFRNONAA 88 09/07/2020   Patient is on Vitamin D supplement.   Lab Results  Component Value Date   VD25OH 90 09/07/2020       Medication Review Current Outpatient Medications on File Prior to Visit  Medication Sig  . aspirin 81 MG chewable tablet Chew 81 mg by mouth every other day.  Marland Kitchen BAYER MICROLET LANCETS lancets Use as instructed  . bisoprolol-hydrochlorothiazide (ZIAC) 5-6.25 MG tablet TAKE 1 TABLET BY MOUTH ONCE A DAY FOR BLOOD PRESSURE  . Blood Glucose Monitoring Suppl (CONTOUR BLOOD GLUCOSE SYSTEM) DEVI Test Blood sugar once daily  . cetirizine (ZYRTEC) 10 MG tablet Take 10 mg by mouth daily.  . Cholecalciferol (VITAMIN D PO) Take 6,000 Int'l Units by mouth daily.   . ferrous sulfate dried (SLOW FE) 160 (50 FE) MG TBCR SR tablet Take 160 mg by mouth daily.  Marland Kitchen glucose blood (ONETOUCH ULTRA) test strip CHECK BLOOD SUGAR DAILY  . hydrochlorothiazide (HYDRODIURIL) 25 MG tablet TAKE 1 TABLET DAILY FOR  BP & FLUID RETENTION (ANKLE SWELLING)  . Magnesium 500 MG CAPS Take 500 mg by mouth 3 (three) times daily.   . meclizine (ANTIVERT) 25 MG tablet Take 1 tablet 3 x / day as needed for Dizziness  . melatonin 3 MG TABS tablet Take 3 mg by mouth at bedtime.  . metFORMIN (GLUCOPHAGE-XR) 500 MG 24 hr tablet Take 1 tablets 3 x /day with Meals for Diabetes. (Patient taking differently: Take one tablet in the morning and two tablets with dinner)  . olmesartan (BENICAR) 40 MG tablet TAKE ONE TABLET BY MOUTH DAILY FOR BLOOD PRESSURE  . phentermine (ADIPEX-P) 37.5 MG tablet Take 1/2-1 tab daily in the morning as needed for weight loss and appetite. Take regular breaks.  . promethazine (PHENERGAN) 25 MG tablet Take 1 tablet every 4 hours if needed for nausea or dizziness  . rosuvastatin (CRESTOR) 10 MG tablet TAKE 1 TABLET BY MOUTH EVERY DAY FOR CHOLESTEROL  . vitamin B-12 (CYANOCOBALAMIN) 1000 MCG tablet Take 1,000 mcg by mouth daily.   No current facility-administered medications on file prior to visit.    Allergies: Allergies  Allergen Reactions  . Penicillins Other (See Comments)  . Lisinopril Cough  . Topamax [Topiramate] Other (See Comments)    Affected mood, lathargic  Current Problems (verified) has Hyperlipidemia associated with type 2 diabetes mellitus ([place]); Hypertension; Vitamin D deficiency; Meniere's disease; Medication management; Overweight (BMI 25.0-29.9); Type 2 diabetes mellitus with sensory neuropathy ([place]); Fatty liver; CKD stage 2 due to type 1 diabetes mellitus ([place]); Environmental allergies; Situational depression; and Neuropathy due to type 2 diabetes mellitus ([place]) on their problem list.  Screening Tests Immunization History  Administered Date(s) Administered  . Influenza Split 07/16/2013, 07/24/2014  . Influenza, High Dose Seasonal PF 06/15/2015, 07/04/2016, 07/14/2017, 06/19/2018, 06/03/2019, 09/14/2020  . PFIZER(Purple Top)SARS-COV-2 Vaccination 11/09/2019,  12/03/2019, 08/21/2020  . PPD Test 04/14/2014  . Pneumococcal Conjugate-13 10/31/2014  . Pneumococcal Polysaccharide-23 12/01/2004, 03/23/2016  . Tdap 04/06/2011  . Zoster 07/16/2013    Preventative care: Last colonoscopy: 2010 Last cologuard: 06/2019 negative Last mammogram: 10/2019, had to reschedule to Jan 28 2021 Last pap smear/pelvic exam: remote, declines another DEXA: 2019 - normal  CXR:11/2010 [place] AB 2014  Prior vaccinations: TD or Tdap: 2012, wants to boost PRN only  Influenza: 2021 at pharmacy  Pneumococcal: 2017 Prevnar13: 2016 Shingles/Zostavax: 2014 Covid 19: 3/3, 2021, pfizer  Names of Other Physician/Practitioners you currently use: 1. [facility]- here for primary care 2. [facility], Dr. Willette Brace, eye doctor, last visit summer 2021, has scheduled this summer - 2021 report requested 3. Dr. Bjorn Pippin, dentist, last visit 2022, goes q60m    Patient Care Team: [name], MD as PCP - General (Internal Medicine) Inda Castle, MD (Inactive) as Consulting Physician (Gastroenterology) Rozetta Nunnery, MD as Consulting Physician (Otolaryngology) Rona Ravens, [place] (Optometry) Justice Britain, MD as Consulting Physician (Orthopedic Surgery) Evelina Bucy, DPM as Consulting Physician (Podiatry)  Surgical: She  has a past surgical history that includes ORIF ankle fracture (Left, 2000); Abdominal hysterectomy (1998); and Shoulder surgery (Right, 02/2017). Family Her family history includes Alcoholism in her mother; Autism in her brother; Breast cancer (age of onset: 21) in her paternal aunt; COPD in her brother; Diabetes in her father; Heart disease in her father and son; Hypertension in her father; Kidney cancer (age of onset: 25) in her sister; Kidney disease in her son; Liver cancer in her maternal grandmother; Lung cancer in her brother; Pancreatic cancer in her mother. Social history  She reports that she has  never smoked. She has never used smokeless tobacco. She reports current alcohol use. She reports that she does not use drugs.  MEDICARE WELLNESS OBJECTIVES: Physical activity: Current Exercise Habits: Home exercise routine, Type of exercise: walking, Time (Minutes): 20, Frequency (Times/Week): 2, Weekly Exercise (Minutes/Week): 40, Intensity: Mild, Exercise limited by: None identified Cardiac risk factors: Cardiac Risk Factors include: advanced age (>92men, >5 women);dyslipidemia;hypertension;diabetes mellitus Depression/mood screen:   Depression screen [facility] 2/9 01/11/2021  Decreased Interest 0  Down, Depressed, Hopeless 1  PHQ - 2 Score 1  Altered sleeping -  Tired, decreased energy -  Change in appetite -  Feeling bad or failure about yourself  -  Trouble concentrating -  Moving slowly or fidgety/restless -  Suicidal thoughts -  PHQ-9 Score -  Difficult doing work/chores -    ADLs:  In your present state of health, do you have any difficulty performing the following activities: 01/11/2021 06/08/2020  Hearing? N N  Vision? N N  Difficulty concentrating or making decisions? N N  Walking or climbing stairs? N N  Dressing or bathing? N N  Doing errands, shopping? N N  Some recent data might be hidden     Cognitive Testing  Alert? Yes  Normal Appearance?Yes  Oriented to person? Yes  Place? Yes   Time? Yes  Recall of three objects?  Yes  Can perform simple calculations? Yes  Displays appropriate judgment?Yes  Can read the correct time from a watch face?Yes  EOL planning: Does Patient Have a Medical Advance Directive?: No Would patient like information on creating a medical advance directive?: No - Patient declined    Review of Systems  Constitutional: Negative.  Negative for malaise/fatigue and weight loss.  HENT: Negative.  Negative for hearing loss and tinnitus (intermittent, bil, high pitched, nonpulsatile).   Eyes: Negative.  Negative for blurred vision and double vision.   Respiratory: Negative.  Negative for cough, sputum production, shortness of breath and wheezing.   Cardiovascular: Negative.  Negative for chest pain, palpitations, orthopnea, claudication, leg swelling and PND.  Gastrointestinal: Negative.  Negative for abdominal pain, blood in stool, constipation, diarrhea, heartburn, melena, nausea and vomiting.  Genitourinary: Negative.   Musculoskeletal: Negative.  Negative for falls, joint pain and myalgias.  Skin: Negative.  Negative for rash.  Neurological: Negative for dizziness (intermittent with menier's, stable/improved), tingling, sensory change, weakness and headaches.  Endo/Heme/Allergies: Negative.  Negative for polydipsia.  Psychiatric/Behavioral: Negative.  Negative for depression, memory loss, substance abuse and suicidal ideas. The patient is not nervous/anxious and does not have insomnia.   All other systems reviewed and are negative.   Objective:   Today's Vitals   01/11/21 0932  BP: 122/68  Pulse: 92  Temp: (!) 97.3 F (36.3 C)  SpO2: 98%  Weight: 141 lb (64 kg)  Height: 4\' 11"  (1.499 m)   Body mass index is 28.48 kg/m.  General appearance: alert, no distress, WD/WN,  female HEENT: normocephalic, sclerae anicteric, TMs pearly, nares patent, no discharge or erythema, pharynx normal Oral cavity: MMM, no lesions Neck: supple, no lymphadenopathy, no thyromegaly, no masses Heart: RRR, normal S1, S2, no murmurs Lungs: CTA bilaterally, no wheezes, rhonchi, or rales Abdomen: +bs, soft, non tender, non distended, no masses, no hepatomegaly, no splenomegaly Musculoskeletal: nontender, no swelling, no obvious deformity Extremities: no edema, no cyanosis, no clubbing Pulses: 2+ symmetric, upper and lower extremities, normal cap refill Neurological: alert, oriented x 3, CN2-12 intact, strength normal upper extremities and lower extremities, sensation normal throughout, DTRs 2+ throughout, no cerebellar signs, gait normal Psychiatric:  normal affect, behavior normal, pleasant    Medicare Attestation I have personally reviewed: The patient's medical and social history Their use of alcohol, tobacco or illicit drugs Their current medications and supplements The patient's functional ability including ADLs,fall risks, home safety risks, cognitive, and hearing and visual impairment Diet and physical activities Evidence for depression or mood disorders  The patient's weight, height, BMI, and visual acuity have been recorded in the chart.  I have made referrals, counseling, and provided education to the patient based on review of the above and I have provided the patient with a written personalized care plan for preventive services.     Izora Ribas, NP   01/11/2021

## 2021-01-11 ENCOUNTER — Ambulatory Visit (INDEPENDENT_AMBULATORY_CARE_PROVIDER_SITE_OTHER): Payer: Medicare HMO | Admitting: Adult Health

## 2021-01-11 ENCOUNTER — Other Ambulatory Visit: Payer: Self-pay

## 2021-01-11 ENCOUNTER — Encounter: Payer: Self-pay | Admitting: Adult Health

## 2021-01-11 VITALS — BP 122/68 | HR 92 | Temp 97.3°F | Ht 59.0 in | Wt 141.0 lb

## 2021-01-11 DIAGNOSIS — E663 Overweight: Secondary | ICD-10-CM

## 2021-01-11 DIAGNOSIS — Z79899 Other long term (current) drug therapy: Secondary | ICD-10-CM

## 2021-01-11 DIAGNOSIS — R6889 Other general symptoms and signs: Secondary | ICD-10-CM

## 2021-01-11 DIAGNOSIS — K760 Fatty (change of) liver, not elsewhere classified: Secondary | ICD-10-CM | POA: Diagnosis not present

## 2021-01-11 DIAGNOSIS — F4321 Adjustment disorder with depressed mood: Secondary | ICD-10-CM

## 2021-01-11 DIAGNOSIS — E1169 Type 2 diabetes mellitus with other specified complication: Secondary | ICD-10-CM

## 2021-01-11 DIAGNOSIS — Z9109 Other allergy status, other than to drugs and biological substances: Secondary | ICD-10-CM

## 2021-01-11 DIAGNOSIS — E1140 Type 2 diabetes mellitus with diabetic neuropathy, unspecified: Secondary | ICD-10-CM | POA: Diagnosis not present

## 2021-01-11 DIAGNOSIS — H8109 Meniere's disease, unspecified ear: Secondary | ICD-10-CM

## 2021-01-11 DIAGNOSIS — E559 Vitamin D deficiency, unspecified: Secondary | ICD-10-CM | POA: Diagnosis not present

## 2021-01-11 DIAGNOSIS — I10 Essential (primary) hypertension: Secondary | ICD-10-CM

## 2021-01-11 DIAGNOSIS — E1022 Type 1 diabetes mellitus with diabetic chronic kidney disease: Secondary | ICD-10-CM | POA: Diagnosis not present

## 2021-01-11 DIAGNOSIS — N182 Chronic kidney disease, stage 2 (mild): Secondary | ICD-10-CM | POA: Diagnosis not present

## 2021-01-11 DIAGNOSIS — Z0001 Encounter for general adult medical examination with abnormal findings: Secondary | ICD-10-CM

## 2021-01-11 DIAGNOSIS — Z0000 Encounter for general adult medical examination without abnormal findings: Secondary | ICD-10-CM

## 2021-01-11 DIAGNOSIS — E785 Hyperlipidemia, unspecified: Secondary | ICD-10-CM | POA: Diagnosis not present

## 2021-01-11 DIAGNOSIS — R69 Illness, unspecified: Secondary | ICD-10-CM | POA: Diagnosis not present

## 2021-01-11 NOTE — Patient Instructions (Signed)
  Ms. Lorenzen , Thank you for taking time to come for your Medicare Wellness Visit. I appreciate your ongoing commitment to your health goals. Please review the following plan we discussed and let me know if I can assist you in the future.   These are the goals we discussed: Goals    . Blood Pressure < 130/80    . DIET - INCREASE WATER INTAKE     Try to get in 4-5 bottles of water daily     . Exercise 150 min/wk Moderate Activity    . LDL CALC < 70    . Weight (lb) < 140 lb (63.5 kg)       This is a list of the screening recommended for you and due dates:  Health Maintenance  Topic Date Due  . Eye exam for diabetics  06/27/2020  . Hemoglobin A1C  03/08/2021  . Tetanus Vaccine  04/05/2021  . Flu Shot  04/19/2021  . Complete foot exam   09/07/2021  . Mammogram  11/07/2021  . Cologuard (Stool DNA test)  06/28/2022  . DEXA scan (bone density measurement)  Completed  . COVID-19 Vaccine  Completed  .  Hepatitis C: One time screening is recommended by Center for Disease Control  (CDC) for  adults born from [birth year] through 1967.   Completed  . Pneumonia vaccines  Completed  . HPV Vaccine  Aged Out

## 2021-01-12 LAB — CBC WITH DIFFERENTIAL/PLATELET
Absolute Monocytes: 598 cells/uL (ref 200–950)
Basophils Absolute: 49 cells/uL (ref 0–200)
Basophils Relative: 0.5 %
Eosinophils Absolute: 284 cells/uL (ref 15–500)
Eosinophils Relative: 2.9 %
HCT: 39.4 % (ref 35.0–45.0)
Hemoglobin: 12.7 g/dL (ref 11.7–15.5)
Lymphs Abs: 2842 cells/uL (ref 850–3900)
MCH: 26.6 pg — ABNORMAL LOW (ref 27.0–33.0)
MCHC: 32.2 g/dL (ref 32.0–36.0)
MCV: 82.4 fL (ref 80.0–100.0)
MPV: 10.6 fL (ref 7.5–12.5)
Monocytes Relative: 6.1 %
Neutro Abs: 6027 cells/uL (ref 1500–7800)
Neutrophils Relative %: 61.5 %
Platelets: 297 10*3/uL (ref 140–400)
RBC: 4.78 10*6/uL (ref 3.80–5.10)
RDW: 13.8 % (ref 11.0–15.0)
Total Lymphocyte: 29 %
WBC: 9.8 10*3/uL (ref 3.8–10.8)

## 2021-01-12 LAB — COMPLETE METABOLIC PANEL WITH GFR
AG Ratio: 1.6 (calc) (ref 1.0–2.5)
ALT: 11 U/L (ref 6–29)
AST: 16 U/L (ref 10–35)
Albumin: 4.4 g/dL (ref 3.6–5.1)
Alkaline phosphatase (APISO): 88 U/L (ref 37–153)
BUN: 13 mg/dL (ref 7–25)
CO2: 30 mmol/L (ref 20–32)
Calcium: 10.2 mg/dL (ref 8.6–10.4)
Chloride: 97 mmol/L — ABNORMAL LOW (ref 98–110)
Creat: 0.69 mg/dL (ref 0.60–0.93)
GFR, Est African American: 102 mL/min/{1.73_m2} (ref >=60)
GFR, Est Non African American: 88 mL/min/{1.73_m2} (ref >=60)
Globulin: 2.8 g/dL (calc) (ref 1.9–3.7)
Glucose, Bld: 115 mg/dL — ABNORMAL HIGH (ref 65–99)
Potassium: 3.7 mmol/L (ref 3.5–5.3)
Sodium: 137 mmol/L (ref 135–146)
Total Bilirubin: 0.4 mg/dL (ref 0.2–1.2)
Total Protein: 7.2 g/dL (ref 6.1–8.1)

## 2021-01-12 LAB — LIPID PANEL
Cholesterol: 145 mg/dL (ref <200)
HDL: 49 mg/dL — ABNORMAL LOW (ref >=50)
LDL Cholesterol (Calc): 73 mg/dL (calc)
Non-HDL Cholesterol (Calc): 96 mg/dL (calc) (ref <130)
Total CHOL/HDL Ratio: 3 (calc) (ref <5.0)
Triglycerides: 142 mg/dL (ref <150)

## 2021-01-12 LAB — TSH: TSH: 2.55 mIU/L (ref 0.40–4.50)

## 2021-01-12 LAB — HEMOGLOBIN A1C
Hgb A1c MFr Bld: 6.2 % of total Hgb — ABNORMAL HIGH (ref <5.7)
Mean Plasma Glucose: 131 mg/dL
eAG (mmol/L): 7.3 mmol/L

## 2021-01-12 LAB — MAGNESIUM: Magnesium: 2 mg/dL (ref 1.5–2.5)

## 2021-01-25 ENCOUNTER — Encounter: Payer: Self-pay | Admitting: Adult Health

## 2021-01-25 DIAGNOSIS — G43109 Migraine with aura, not intractable, without status migrainosus: Secondary | ICD-10-CM | POA: Insufficient documentation

## 2021-01-28 ENCOUNTER — Ambulatory Visit
Admission: RE | Admit: 2021-01-28 | Discharge: 2021-01-28 | Disposition: A | Discharge status: Home / Self Care | Payer: Medicare HMO | Source: Ambulatory Visit | Attending: Internal Medicine | Admitting: Internal Medicine

## 2021-01-28 ENCOUNTER — Other Ambulatory Visit: Payer: Self-pay

## 2021-01-28 DIAGNOSIS — Z1231 Encounter for screening mammogram for malignant neoplasm of breast: Secondary | ICD-10-CM | POA: Diagnosis not present

## 2021-01-29 DIAGNOSIS — H52223 Regular astigmatism, bilateral: Secondary | ICD-10-CM | POA: Diagnosis not present

## 2021-01-29 DIAGNOSIS — E119 Type 2 diabetes mellitus without complications: Secondary | ICD-10-CM | POA: Diagnosis not present

## 2021-01-29 DIAGNOSIS — H2513 Age-related nuclear cataract, bilateral: Secondary | ICD-10-CM | POA: Diagnosis not present

## 2021-01-29 DIAGNOSIS — H5203 Hypermetropia, bilateral: Secondary | ICD-10-CM | POA: Diagnosis not present

## 2021-03-26 DIAGNOSIS — Z23 Encounter for immunization: Secondary | ICD-10-CM | POA: Diagnosis not present

## 2021-04-01 ENCOUNTER — Other Ambulatory Visit: Payer: Self-pay | Admitting: Internal Medicine

## 2021-04-12 NOTE — Progress Notes (Signed)
Future Appointments  Date Time Provider [place]  04/13/2021  9:30 AM [name], MD [facility] None  09/07/2021 10:00 AM [name], NP [facility] None  01/11/2022  9:30 AM [name], NP [facility] None    History of Present Illness:       This very nice 70 y.o.  MWF presents for 3 month follow up with HTN, HLD, Pre-Diabetes and Vitamin D Deficiency.        Patient is treated for HTN & BP has been controlled at home. Today's BP is at goal - 116/78. Patient has had no complaints of any cardiac type chest pain, palpitations, dyspnea / orthopnea / PND, dizziness, claudication, or dependent edema.       Hyperlipidemia is controlled with diet & meds. Patient denies myalgias or other med SE's. Last Lipids were                                                                      Lab Results  Component Value Date   CHOL 145 01/11/2021   HDL 49 (L) 01/11/2021   LDLCALC 73 01/11/2021   TRIG 142 01/11/2021   CHOLHDL 3.0 01/11/2021     Also, the patient has history of T2_NIDDM PreDiabetes and has had no symptoms of reactive hypoglycemia, diabetic polys, paresthesias or visual blurring.  Last A1c was not at goal:  Lab Results  Component Value Date   HGBA1C 6.2 (H) 01/11/2021                                                            Further, the patient also has history of Vitamin D Deficiency and supplements vitamin D without any suspected side-effects. Last vitamin D was at goal:   Lab Results  Component Value Date   VD25OH 90 09/07/2020     Current Outpatient Medications on File Prior to Visit  Medication Sig   aspirin 81 MG chewable tablet Chew 81 mg by mouth every other day.   bisoprolol-hydrochlorothiazide (ZIAC) 5-6.25 MG tablet TAKE 1 TABLET BY MOUTH ONCE A DAY FOR BLOOD PRESSURE   cetirizine (ZYRTEC) 10 MG tablet Take 10 mg by mouth daily.   Cholecalciferol (VITAMIN D PO) Take 6,000 Int'l Units by mouth daily.    ferrous sulfate dried  (SLOW FE) 160 (50 FE) MG TBCR SR tablet Take 160 mg by mouth daily.   glucose blood (ONETOUCH ULTRA) test strip CHECK BLOOD SUGAR DAILY   hydrochlorothiazide (HYDRODIURIL) 25 MG tablet TAKE 1 TABLET DAILY FOR BLOOD PRESSURE & FLUID RETENTION (ANKLE SWELLING)   Magnesium 500 MG CAPS Take 500 mg by mouth 3 (three) times daily.    meclizine (ANTIVERT) 25 MG tablet Take 1 tablet 3 x / day as needed for Dizziness   melatonin 3 MG TABS tablet Take 3 mg by mouth at bedtime.   metFORMIN (GLUCOPHAGE-XR) 500 MG 24 hr tablet Take 1 tablets 3 x /day with Meals for Diabetes. (Patient taking differently: Take one tablet in the morning and two tablets with dinner)  olmesartan (BENICAR) 40 MG tablet TAKE ONE TABLET BY MOUTH DAILY FOR BLOOD PRESSURE   phentermine (ADIPEX-P) 37.5 MG tablet Take 1/2-1 tab daily in the morning as needed for weight loss and appetite. Take regular breaks.   promethazine (PHENERGAN) 25 MG tablet Take 1 tablet every 4 hours if needed for nausea or dizziness   rosuvastatin (CRESTOR) 10 MG tablet TAKE 1 TABLET BY MOUTH EVERY DAY FOR CHOLESTEROL   vitamin B-12 (CYANOCOBALAMIN) 1000 MCG tablet Take 1,000 mcg by mouth daily.      Allergies  Allergen Reactions   Penicillins Other (See Comments)   Lisinopril Cough   Topamax [Topiramate] Other (See Comments)    Affected mood, lathargic     PMHx:   Past Medical History:  Diagnosis Date   Allergy    Closed fracture of proximal phalanx of great toe 03/06/2018   Hyperlipidemia    Hypertension    Iron deficiency    Meniere's disease    Neuropathy    Type II or unspecified type diabetes mellitus without mention of complication, not stated as uncontrolled    Vitamin D deficiency      Immunization History  Administered Date(s) Administered   Influenza Split 07/16/2013, 07/24/2014   Influenza, High Dose Seasonal PF 06/15/2015, 07/04/2016, 07/14/2017, 06/19/2018, 06/03/2019, 09/14/2020   PFIZER(Purple Top)SARS-COV-2 Vaccination  11/09/2019, 12/03/2019, 08/21/2020, 03/26/2021   PPD Test 04/14/2014   Pneumococcal Conjugate-13 10/31/2014   Pneumococcal Polysaccharide-23 12/01/2004, 03/23/2016   Tdap 04/06/2011   Zoster, Live 07/16/2013     Past Surgical History:  Procedure Laterality Date   ABDOMINAL HYSTERECTOMY  1998   BSO   ORIF ANKLE FRACTURE Left 2000   SHOULDER SURGERY Right 02/2017   rotator cuff repair Dr. Augustin Coupe    FHx:    Reviewed / unchanged  SHx:    Reviewed / unchanged   Systems Review:  Constitutional: Denies fever, chills, wt changes, headaches, insomnia, fatigue, night sweats, change in appetite. Eyes: Denies redness, blurred vision, diplopia, discharge, itchy, watery eyes.  ENT: Denies discharge, congestion, post nasal drip, epistaxis, sore throat, earache, hearing loss, dental pain, tinnitus, vertigo, sinus pain, snoring.  CV: Denies chest pain, palpitations, irregular heartbeat, syncope, dyspnea, diaphoresis, orthopnea, PND, claudication or edema. Respiratory: denies cough, dyspnea, DOE, pleurisy, hoarseness, laryngitis, wheezing.  Gastrointestinal: Denies dysphagia, odynophagia, heartburn, reflux, water brash, abdominal pain or cramps, nausea, vomiting, bloating, diarrhea, constipation, hematemesis, melena, hematochezia  or hemorrhoids. Genitourinary: Denies dysuria, frequency, urgency, nocturia, hesitancy, discharge, hematuria or flank pain. Musculoskeletal: Denies arthralgias, myalgias, stiffness, jt. swelling, pain, limping or strain/sprain.  Skin: Denies pruritus, rash, hives, warts, acne, eczema or change in skin lesion(s). Neuro: No weakness, tremor, incoordination, spasms, paresthesia or pain. Psychiatric: Denies confusion, memory loss or sensory loss. Endo: Denies change in weight, skin or hair change.  Heme/Lymph: No excessive bleeding, bruising or enlarged lymph nodes.  Physical Exam  BP 116/78   Pulse 76   Temp 97.6 F (36.4 C)   Resp 16   Ht '4\' 11"'$  (1.499 m)   Wt  137 lb (62.1 kg)   SpO2 97%   BMI 27.67 kg/m   Appears  well nourished, well groomed  and in no distress.  Eyes: PERRLA, EOMs, conjunctiva no swelling or erythema. Sinuses: No frontal/maxillary tenderness ENT/Mouth: EAC's clear, TM's nl w/o erythema, bulging. Nares clear w/o erythema, swelling, exudates. Oropharynx clear without erythema or exudates. Oral hygiene is good. Tongue normal, non obstructing. Hearing intact.  Neck: Supple. Thyroid not palpable. Car 2+/2+ without bruits, nodes or  JVD. Chest: Respirations nl with BS clear & equal w/o rales, rhonchi, wheezing or stridor.  Cor: Heart sounds normal w/ regular rate and rhythm without sig. murmurs, gallops, clicks or rubs. Peripheral pulses normal and equal  without edema.  Abdomen: Soft & bowel sounds normal. Non-tender w/o guarding, rebound, hernias, masses or organomegaly.  Lymphatics: Unremarkable.  Musculoskeletal: Full ROM all peripheral extremities, joint stability, 5/5 strength and normal gait.  Skin: Warm, dry without exposed rashes, lesions or ecchymosis apparent.  Neuro: Cranial nerves intact, reflexes equal bilaterally. Sensory-motor testing grossly intact. Tendon reflexes grossly intact.  Pysch: Alert & oriented x 3.  Insight and judgement nl & appropriate. No ideations.  Assessment and Plan:  1. Essential hypertension  - Continue medication, monitor blood pressure at home.  - Continue DASH diet.  Reminder to go to the ER if any CP,  SOB, nausea, dizziness, severe HA, changes vision/speech.   - CBC with Differential/Platelet - COMPLETE METABOLIC PANEL WITH GFR - Magnesium - TSH  2. Hyperlipidemia associated with type 2 diabetes mellitus ([place])  - Continue diet/meds, exercise,& lifestyle modifications.  - Continue monitor periodic cholesterol/liver & renal functions   - TSH - Lipid panel  3. Type 2 diabetes mellitus with stage 2 chronic kidney disease, without long-term current use of insulin ([facility])  Continue  diet, exercise  - Lifestyle modifications.  - Monitor appropriate labs.  - Insulin, random - Hemoglobin A1c  4. Vitamin D deficiency  - Continue supplementation.   5. Type 2 diabetes mellitus with diabetic neuropathy([facility])  - Hemoglobin A1c         Discussed  regular exercise, BP monitoring, weight control to achieve/maintain BMI less than 25 and discussed med and SE's. Recommended labs to assess and monitor clinical status with further disposition pending results of labs.  I discussed the assessment and treatment plan with the patient. The patient was provided an opportunity to ask questions and all were answered. The patient agreed with the plan and demonstrated an understanding of the instructions.  I provided over 30 minutes of exam, counseling, chart review and  complex critical decision making.        The patient was advised to call back or seek an in-person evaluation if the symptoms worsen or if the condition fails to improve as anticipated.   Kirtland Bouchard, MD

## 2021-04-12 NOTE — Patient Instructions (Signed)

## 2021-04-13 ENCOUNTER — Encounter: Payer: Self-pay | Admitting: Internal Medicine

## 2021-04-13 ENCOUNTER — Other Ambulatory Visit: Payer: Self-pay | Admitting: Internal Medicine

## 2021-04-13 ENCOUNTER — Ambulatory Visit (INDEPENDENT_AMBULATORY_CARE_PROVIDER_SITE_OTHER): Payer: Medicare HMO | Admitting: Internal Medicine

## 2021-04-13 ENCOUNTER — Other Ambulatory Visit: Payer: Self-pay

## 2021-04-13 VITALS — BP 116/78 | HR 76 | Temp 97.6°F | Resp 16 | Ht 59.0 in | Wt 137.0 lb

## 2021-04-13 DIAGNOSIS — E785 Hyperlipidemia, unspecified: Secondary | ICD-10-CM | POA: Diagnosis not present

## 2021-04-13 DIAGNOSIS — E1140 Type 2 diabetes mellitus with diabetic neuropathy, unspecified: Secondary | ICD-10-CM | POA: Diagnosis not present

## 2021-04-13 DIAGNOSIS — N182 Chronic kidney disease, stage 2 (mild): Secondary | ICD-10-CM

## 2021-04-13 DIAGNOSIS — E559 Vitamin D deficiency, unspecified: Secondary | ICD-10-CM | POA: Diagnosis not present

## 2021-04-13 DIAGNOSIS — E1122 Type 2 diabetes mellitus with diabetic chronic kidney disease: Secondary | ICD-10-CM

## 2021-04-13 DIAGNOSIS — I10 Essential (primary) hypertension: Secondary | ICD-10-CM | POA: Diagnosis not present

## 2021-04-13 DIAGNOSIS — E1169 Type 2 diabetes mellitus with other specified complication: Secondary | ICD-10-CM | POA: Diagnosis not present

## 2021-04-13 MED ORDER — CONTOUR BLOOD GLUCOSE SYSTEM W/DEVICE KIT: PACK | 99 refills | Status: DC

## 2021-04-14 LAB — COMPLETE METABOLIC PANEL WITH GFR
AG Ratio: 1.5 (calc) (ref 1.0–2.5)
ALT: 11 U/L (ref 6–29)
AST: 16 U/L (ref 10–35)
Albumin: 4.4 g/dL (ref 3.6–5.1)
Alkaline phosphatase (APISO): 81 U/L (ref 37–153)
BUN: 13 mg/dL (ref 7–25)
CO2: 29 mmol/L (ref 20–32)
Calcium: 10.1 mg/dL (ref 8.6–10.4)
Chloride: 96 mmol/L — ABNORMAL LOW (ref 98–110)
Creat: 0.67 mg/dL (ref 0.60–1.00)
Globulin: 2.9 g/dL (calc) (ref 1.9–3.7)
Glucose, Bld: 109 mg/dL — ABNORMAL HIGH (ref 65–99)
Potassium: 3.9 mmol/L (ref 3.5–5.3)
Sodium: 134 mmol/L — ABNORMAL LOW (ref 135–146)
Total Bilirubin: 0.5 mg/dL (ref 0.2–1.2)
Total Protein: 7.3 g/dL (ref 6.1–8.1)
eGFR: 93 mL/min/{1.73_m2} (ref >=60)

## 2021-04-14 LAB — LIPID PANEL
Cholesterol: 134 mg/dL (ref <200)
HDL: 47 mg/dL — ABNORMAL LOW (ref >=50)
LDL Cholesterol (Calc): 63 mg/dL (calc)
Non-HDL Cholesterol (Calc): 87 mg/dL (calc) (ref <130)
Total CHOL/HDL Ratio: 2.9 (calc) (ref <5.0)
Triglycerides: 153 mg/dL — ABNORMAL HIGH (ref <150)

## 2021-04-14 LAB — TSH: TSH: 3.39 mIU/L (ref 0.40–4.50)

## 2021-04-14 LAB — INSULIN, RANDOM: Insulin: 12.1 u[IU]/mL

## 2021-04-14 LAB — CBC WITH DIFFERENTIAL/PLATELET
Absolute Monocytes: 620 cells/uL (ref 200–950)
Basophils Absolute: 66 cells/uL (ref 0–200)
Basophils Relative: 0.7 %
Eosinophils Absolute: 291 cells/uL (ref 15–500)
Eosinophils Relative: 3.1 %
HCT: 38.9 % (ref 35.0–45.0)
Hemoglobin: 12.7 g/dL (ref 11.7–15.5)
Lymphs Abs: 2886 cells/uL (ref 850–3900)
MCH: 26.8 pg — ABNORMAL LOW (ref 27.0–33.0)
MCHC: 32.6 g/dL (ref 32.0–36.0)
MCV: 82.1 fL (ref 80.0–100.0)
MPV: 10.6 fL (ref 7.5–12.5)
Monocytes Relative: 6.6 %
Neutro Abs: 5537 cells/uL (ref 1500–7800)
Neutrophils Relative %: 58.9 %
Platelets: 293 10*3/uL (ref 140–400)
RBC: 4.74 10*6/uL (ref 3.80–5.10)
RDW: 13.1 % (ref 11.0–15.0)
Total Lymphocyte: 30.7 %
WBC: 9.4 10*3/uL (ref 3.8–10.8)

## 2021-04-14 LAB — HEMOGLOBIN A1C
Hgb A1c MFr Bld: 6.1 % of total Hgb — ABNORMAL HIGH (ref <5.7)
Mean Plasma Glucose: 128 mg/dL
eAG (mmol/L): 7.1 mmol/L

## 2021-04-14 LAB — MAGNESIUM: Magnesium: 1.6 mg/dL (ref 1.5–2.5)

## 2021-04-14 NOTE — Progress Notes (Signed)
============================================================ -   Test results slightly outside the reference range are not unusual. If there is anything important, I will review this with you,  otherwise it is considered normal test values.  If you have further questions,  please do not hesitate to contact me at the office or via My Chart.  ============================================================ ============================================================  -   Magnesium  -   1.6   -  very  low- goal is betw 2.0 - 2.5,   - So..............Marland Kitchen  Recommend that you take  Magnesium 500 mg x 2 tablets / daily   - also important to eat lots of  leafy green vegetables   - spinach - Kale - collards - greens - okra - asparagus  - broccoli - quinoa - squash - almonds   - black, red, white beans  -  peas - green beans ============================================================ ============================================================  - A1c = 6.1% - still slightly low -   - Avoid Sweets, Candy & White Stuff   - White Rice, White Potatoes, White Flour  - Breads &  Pasta ============================================================ ============================================================  - Total Chol = 134     &     LDL Chol = 63   - Both  Excellent   - Very low risk for Heart Attack  / Stroke ============================================================ ============================================================  -  All Else - CBC - Kidneys - Electrolytes - Liver - Magnesium & Thyroid    - all  Normal / OK ============================================================ ============================================================  - Keep up the [place] Job       ============================================================ ============================================================

## 2021-04-22 ENCOUNTER — Other Ambulatory Visit: Payer: Self-pay

## 2021-04-22 ENCOUNTER — Encounter: Payer: Self-pay | Admitting: Podiatry

## 2021-04-22 ENCOUNTER — Ambulatory Visit: Payer: Medicare HMO | Admitting: Podiatry

## 2021-04-22 ENCOUNTER — Ambulatory Visit (INDEPENDENT_AMBULATORY_CARE_PROVIDER_SITE_OTHER): Payer: Medicare HMO

## 2021-04-22 DIAGNOSIS — D2371 Other benign neoplasm of skin of right lower limb, including hip: Secondary | ICD-10-CM

## 2021-04-22 DIAGNOSIS — M898X7 Other specified disorders of bone, ankle and foot: Secondary | ICD-10-CM

## 2021-04-22 DIAGNOSIS — M2041 Other hammer toe(s) (acquired), right foot: Secondary | ICD-10-CM | POA: Diagnosis not present

## 2021-04-25 NOTE — Progress Notes (Signed)
She presents today as a second opinion had seen Dr. March Rummage in the past and he said that this problem beneath the hallux interphalangeal joint with just a bone.  She states that she does not understand how its about when she has to trim it off.  Objective: Vital signs are stable alert and oriented x3.  Pulses are palpable.  Neurologic sensorium is intact Deetjen reflexes are intact muscle strength is +5/5 dorsiflexion plantarflexion of her retrievers onto the musculature is intact.  Objective: She does have extensive of the hallux and just beneath the area of extensors there is an area of reactive hyperkeratosis on physical exam.  Assessment: Hallux extensors with reactive hyperkeratotic lesion.  Plan: Debrided the area today.  Did discuss possible surgical procedures that could eliminate this problem for her she understands and is amenable to it follow-up with her in a couple of months.

## 2021-05-28 ENCOUNTER — Other Ambulatory Visit: Payer: Self-pay | Admitting: Internal Medicine

## 2021-05-28 DIAGNOSIS — E1140 Type 2 diabetes mellitus with diabetic neuropathy, unspecified: Secondary | ICD-10-CM

## 2021-06-17 ENCOUNTER — Other Ambulatory Visit: Payer: Self-pay | Admitting: Internal Medicine

## 2021-06-20 ENCOUNTER — Other Ambulatory Visit: Payer: Self-pay | Admitting: Adult Health

## 2021-06-20 DIAGNOSIS — I10 Essential (primary) hypertension: Secondary | ICD-10-CM

## 2021-06-22 ENCOUNTER — Encounter: Payer: Self-pay | Admitting: Podiatry

## 2021-06-22 ENCOUNTER — Ambulatory Visit: Payer: Medicare HMO | Admitting: Podiatry

## 2021-06-22 ENCOUNTER — Other Ambulatory Visit: Payer: Self-pay

## 2021-06-22 DIAGNOSIS — D2371 Other benign neoplasm of skin of right lower limb, including hip: Secondary | ICD-10-CM | POA: Diagnosis not present

## 2021-06-23 NOTE — Progress Notes (Signed)
Presents today chief complaint of painful corns and calluses.  Objective: Vital signs are stable alert orient x3 pulses are palpable.  She has a palpable benign hyperkeratotic lesion plantar aspect of the hallux interphalangeal joint right great toe.  Assessment: Benign skin lesion right.  Plan: Debridement of lesion.  Follow-up with [place] as needed.

## 2021-06-27 ENCOUNTER — Other Ambulatory Visit: Payer: Self-pay | Admitting: Adult Health

## 2021-08-24 ENCOUNTER — Ambulatory Visit: Payer: Medicare HMO | Admitting: Podiatry

## 2021-09-02 NOTE — Progress Notes (Signed)
CPE AND FOLLOW UP  Assessment:   Encounter for Annual Physical Exam with abnormal findings Due annually  Health Maintenance reviewed Healthy lifestyle reviewed and goals set  Essential hypertension - continue medications, DASH diet, exercise and monitor at home. Call if greater than 130/80.  - CBC with Differential/Platelet - CMP/GFR - TSH - UA/Micro - EKG  Type 2 diabetes mellitus with sensory neuropathy ([place]) Discussed general issues about diabetes pathophysiology and management., Educational material distributed., Suggested low cholesterol diet., Encouraged aerobic exercise., Discussed foot care., Reminded to get yearly retinal exam.- report requested - Hemoglobin A1c  Hyperlipidemia associated with T2DM ([place]) -continue medications, check lipids, decrease fatty foods, increase activity.  - LDL goal <70 - Lipid panel  CKD 2 associated with T2DM ([place]) On ACEi; Increase fluids, avoid NSAIDS, monitor sugars, will monitor - CMP/GFR  Neuropathy associated with T2DM ([facility]) Control sugars, check feet daily.  Podiatry follows   Vitamin D deficiency Continue supplement, check at CPE   Medication management - Magnesium  Meniere's disease, unspecified laterality Continue HCTZ, monitor kidney function Follow up Dr. Lucia Gaskins  Overweight with comorbidities- BMI 28 - long discussion about weight loss, diet, and exercise Doing well with phentermine PRN; reminded to take routine drug break  Fatty liver Weight loss advised, avoid alcohol/tylenol, will monitor LFTs  Environmental allegies Continue OTC antihistamine, singulair Hygiene reviewed  Situational depression Declines meds, managing well with lifestyle   Ophthalmic migraine Occasional, stress related; doing well with conservative therapy - rest/ibuprofen  Orders Placed This Encounter  Procedures   CBC with Differential/Platelet   COMPLETE METABOLIC PANEL WITH GFR   Magnesium   Lipid panel   TSH   Hemoglobin A1c    VITAMIN D 25 Hydroxy (Vit-D Deficiency, Fractures)   Vitamin B12   Iron, TIBC and Ferritin Panel   Microalbumin / creatinine urine ratio   Urinalysis, Routine w reflex microscopic   EKG 12-Lead   HM DIABETES FOOT EXAM     Over 30 minutes of exam, counseling, chart review, and critical decision making was performed  Future Appointments  Date Time Provider [place]  09/23/2021  3:15 PM [place], [place] [facility]  01/11/2022  9:30 AM [name], NP [facility] None  09/07/2022 10:00 AM [name], NP [facility] None    Plan:   During the course of the visit the patient was educated and counseled about appropriate screening and preventive services including:   Pneumococcal vaccine  Influenza vaccine Td vaccine Prevnar 13 Screening electrocardiogram Screening mammography Bone densitometry screening Colorectal cancer screening Diabetes screening Glaucoma screening Nutrition counseling  Advanced directives: given info/requested copies   Subjective:   Regina Cervantes is a 70 y.o. female who presents for CPE. She has Hyperlipidemia associated with type 2 diabetes mellitus ([place]); Hypertension; Vitamin D deficiency; Meniere's disease; Medication management; Overweight (BMI 25.0-29.9); Type 2 diabetes mellitus with sensory neuropathy ([place]); Fatty liver; CKD stage 2 due to type 1 diabetes mellitus ([place]); Environmental allergies; Situational depression; Neuropathy due to type 2 diabetes mellitus ([place]); and Ophthalmic migraine on their problem list.  She is married, 3 sons, youngest passed at 80 with ruptured pancreatic cyst, 3 grandchildren.   She had stressfull past year; sister passed due to metastatic kidney cancer. Brother was diagnosed with esophageal cancer but doing well with chemo.  Feels she is managing mood fairly, she is taking melatonin 3 mg for sleep which is is working well. Has had some occasional ophthalmic migraines improved, Rest and  ibuprofen helps.   She  has seen Dr. Lucia Gaskins for her meniere's, she is back on her HCTZ.  She has carpal tunnel bothering her more, has appointment to see Dr. Amedeo Plenty later this month. Wearing bil braces without improvement.   BMI is Body mass index is 28.56 kg/m., she is working on diet and exercise, doing walking 2 days a week, generally active on her feel all day. Watches portions.  she is prescribed phentermine for weight loss. Taking 1/2 tab only as needed. Recently took an extended break. While on the medication they have lost 0 lbs since last visit. They deny palpitations, anxiety, trouble sleeping, elevated BP.  Wt Readings from Last 3 Encounters:  09/07/21 141 lb 6.4 oz (64.1 kg)  04/13/21 137 lb (62.1 kg)  01/11/21 141 lb (64 kg)   Her blood pressure has been controlled at home, today their BP is BP: 110/66  She does workout, generally active. She denies chest pain, shortness of breath, dizziness.    She is on cholesterol medication (rosuvastatain 10 mg daily) and denies myalgias. Her cholesterol is at goal. The cholesterol last visit was:   Lab Results  Component Value Date   CHOL 134 04/13/2021   HDL 47 (L) 04/13/2021   LDLCALC 63 04/13/2021   TRIG 153 (H) 04/13/2021   CHOLHDL 2.9 04/13/2021    She was first diagnosed with DM 10-15 years ago, has been working on diet and exercise for diabetes with DM neuropathy (tingling, manages with topicals, podiatry Dr. Milinda Pointer following) and CKD II on ACEi, she has done a great job bringing down her A1C with weight loss/phentermine, she is on metformin, bASA, and denies polydipsia, polyuria and visual disturbances. Taking metformin 500 mg 3 tabs/day due to GI sx. She does check fasting sugars occasionally, ranging 90-110 or so. Last A1C in the office was:  Lab Results  Component Value Date   HGBA1C 6.1 (H) 04/13/2021    CKD II associated with T2DM, monitored at this office, olmesartan. Last GFR:  Lab Results  Component Value Date    GFRNONAA 88 01/11/2021   Patient is on Vitamin D supplement, taking 6000 IU daily   Lab Results  Component Value Date   VD25OH 90 09/07/2020     She takes B12 supplement  Lab Results  Component Value Date   VCBSWHQP59 163 09/07/2020   Has been on slow release iron daily for many years, 57 fe.  Lab Results  Component Value Date   IRON 77 09/07/2020   TIBC 395 09/07/2020   FERRITIN 21 09/07/2020      Medication Review Current Outpatient Medications on File Prior to Visit  Medication Sig   aspirin 81 MG chewable tablet Chew 81 mg by mouth every other day.   BAYER MICROLET LANCETS lancets Use as instructed   bisoprolol-hydrochlorothiazide (ZIAC) 5-6.25 MG tablet TAKE 1 TABLET BY MOUTH ONCE A DAY FOR BLOOD PRESSURE   Blood Glucose Monitoring Suppl (CONTOUR NEXT MONITOR) w/Device KIT USE TO CHECK BLOOD SUGAR DAILY   cetirizine (ZYRTEC) 10 MG tablet Take 10 mg by mouth daily.   Cholecalciferol (VITAMIN D PO) Take 6,000 Int'l Units by mouth daily.    ferrous sulfate dried (SLOW FE) 160 (50 FE) MG TBCR SR tablet Take 160 mg by mouth daily.   glucose blood (ONETOUCH ULTRA) test strip CHECK BLOOD SUGAR DAILY   hydrochlorothiazide (HYDRODIURIL) 25 MG tablet TAKE 1 TABLET DAILY FOR BLOOD PRESSURE & FLUID RETENTION (ANKLE SWELLING)   Magnesium 500 MG CAPS Take 500 mg by mouth 3 (  three) times daily.    meclizine (ANTIVERT) 25 MG tablet Take 1 tablet 3 x / day as needed for Dizziness   melatonin 3 MG TABS tablet Take 3 mg by mouth at bedtime.   metFORMIN (GLUCOPHAGE-XR) 500 MG 24 hr tablet TAKE 2 TABLETS 2 TIMES A DAY WITH MEALS FOR DIABETES   olmesartan (BENICAR) 40 MG tablet Take  1 tablet  Daily  for BP               /TAKE ONE TABLET BY MOUTH DAILY FOR BLOOD PRESSURE   phentermine (ADIPEX-P) 37.5 MG tablet TAKE 1/2 TO 1 TABLET EVERY MORNING FOR DIETING & WEIGHT LOSS   promethazine (PHENERGAN) 25 MG tablet Take 1 tablet every 4 hours if needed for nausea or dizziness   rosuvastatin  (CRESTOR) 10 MG tablet Take  1 tablet  Daily  for Cholesterol                          /      TAKE 1 TABLET BY MOUTH EVERY DAY FOR CHOLESTEROL   vitamin B-12 (CYANOCOBALAMIN) 1000 MCG tablet Take 1,000 mcg by mouth daily.   No current facility-administered medications on file prior to visit.    Allergies: Allergies  Allergen Reactions   Penicillins Other (See Comments)   Lisinopril Cough   Topamax [Topiramate] Other (See Comments)    Affected mood, lathargic    Current Problems (verified) has Hyperlipidemia associated with type 2 diabetes mellitus ([place]); Hypertension; Vitamin D deficiency; Meniere's disease; Medication management; Overweight (BMI 25.0-29.9); Type 2 diabetes mellitus with sensory neuropathy ([place]); Fatty liver; CKD stage 2 due to type 1 diabetes mellitus ([place]); Environmental allergies; Situational depression; Neuropathy due to type 2 diabetes mellitus ([place]); and Ophthalmic migraine on their problem list.  Screening Tests Immunization History  Administered Date(s) Administered   Fluad Quad(high Dose 65+) 08/27/2021   Influenza Split 07/16/2013, 07/24/2014   Influenza, High Dose Seasonal PF 06/15/2015, 07/04/2016, 07/14/2017, 06/19/2018, 06/03/2019, 09/14/2020   Influenza-Unspecified 07/07/2017   PFIZER Comirnaty(Gray Top)Covid-19 Tri-Sucrose Vaccine 03/26/2021   PFIZER(Purple Top)SARS-COV-2 Vaccination 11/09/2019, 12/03/2019, 08/21/2020   PPD Test 04/14/2014   Pfizer Covid-19 Vaccine Bivalent Booster 76yr & up 08/27/2021   Pneumococcal Conjugate-13 10/31/2014   Pneumococcal Polysaccharide-23 12/01/2004, 03/23/2016   Tdap 04/06/2011   Zoster, Live 07/16/2013    Preventative care: Last colonoscopy: 2010 Last cologuard: 06/2019 negative Last mammogram: 01/28/2021 Last pap smear/pelvic exam: remote, hysterectomy, DONE DEXA: 2019 - normal   Prior vaccinations: TD or Tdap: 2012, wants to boost PRN only  Influenza: 2022 at pharmacy ** Pneumococcal: 2017 Prevnar13:  2016 Shingles/Zostavax: 2014 Covid 19: 2/2, boosted 08/2021.   Names of Other Physician/Practitioners you currently use: 1. [facility]- here for primary care 2. [facility], Dr. AWillette Brace eye doctor, last visit 03/2021, report requested, will be getting new vision provider next year 3. Dr. HBjorn Pippin dentist, last visit 2022, goes q652mDr. HyMilinda Pointer- podiatry, neuropathy   Patient Care Team: McUnk PintoMD as PCP - General (Internal Medicine) KaInda CastleMD (Inactive) as Consulting Physician (Gastroenterology) NeRozetta NunneryMD as Consulting Physician (Otolaryngology) AbRona RavensOD[facility]) SuJustice BritainMD as Consulting Physician (Orthopedic Surgery) HyGarrel RidgelDPM as Consulting Physician (Podiatry)  Surgical: She  has a past surgical history that includes ORIF ankle fracture (Left, 2000); Abdominal hysterectomy (1998); and Shoulder surgery (Right, 02/2017). Family Her family history includes Alcoholism in her mother;  Autism in her brother; Breast cancer (age of onset: 58) in her paternal aunt; COPD in her brother; Diabetes in her father; Esophageal cancer (age of onset: 45) in her brother; Heart disease in her father and son; Hypertension in her father; Kidney cancer (age of onset: 33) in her sister; Kidney disease in her son; Liver cancer in her maternal grandmother; Lung cancer in her brother; Pancreatic cancer in her mother. Social history  She reports that she has never smoked. She has never used smokeless tobacco. She reports current alcohol use. She reports that she does not use drugs.  Depression/mood screen:   Depression screen [facility] 2/9 09/07/2021  Decreased Interest 0  Down, Depressed, Hopeless 1  PHQ - 2 Score 1  Altered sleeping -  Tired, decreased energy -  Change in appetite -  Feeling bad or failure about yourself  -  Trouble concentrating -  Moving slowly or fidgety/restless -  Suicidal  thoughts -  PHQ-9 Score -  Difficult doing work/chores -     Review of Systems  Constitutional: Negative.  Negative for malaise/fatigue and weight loss.  HENT: Negative.  Negative for hearing loss and tinnitus (intermittent, bil, high pitched, nonpulsatile).   Eyes: Negative.  Negative for blurred vision and double vision.  Respiratory: Negative.  Negative for cough, sputum production, shortness of breath and wheezing.   Cardiovascular: Negative.  Negative for chest pain, palpitations, orthopnea, claudication, leg swelling and PND.  Gastrointestinal: Negative.  Negative for abdominal pain, blood in stool, constipation, diarrhea, heartburn, melena, nausea and vomiting.  Genitourinary: Negative.   Musculoskeletal: Negative.  Negative for falls, joint pain and myalgias.  Skin: Negative.  Negative for rash.  Neurological:  Positive for tingling (bil toes, stable). Negative for dizziness (intermittent with menier's, stable/improved), sensory change, weakness and headaches.  Endo/Heme/Allergies: Negative.  Negative for polydipsia.  Psychiatric/Behavioral: Negative.  Negative for depression, memory loss, substance abuse and suicidal ideas. The patient is not nervous/anxious and does not have insomnia.   All other systems reviewed and are negative.  Objective:   Today's Vitals   09/07/21 1005  BP: 110/66  Pulse: 86  Temp: (!) 97.5 F (36.4 C)  SpO2: 99%  Weight: 141 lb 6.4 oz (64.1 kg)  Height: 4' 11"  (1.499 m)   Body mass index is 28.56 kg/m.  General appearance: alert, no distress, WD/WN,  female HEENT: normocephalic, sclerae anicteric, TMs pearly, nares patent, no discharge or erythema, pharynx normal Oral cavity: MMM, no lesions Neck: supple, no lymphadenopathy, no thyromegaly, no masses Heart: RRR, normal S1, S2, no murmurs Lungs: CTA bilaterally, no wheezes, rhonchi, or rales Abdomen: +bs, soft, non tender, non distended, no masses, no hepatomegaly, no  splenomegaly Musculoskeletal: nontender, no swelling, no obvious deformity Extremities: no edema, no cyanosis, no clubbing Pulses: 2+ symmetric, upper and lower extremities, normal cap refill Neurological: alert, oriented x 3, CN2-12 intact, strength normal upper extremities and lower extremities, sensation mildly dulled to monofilament in toes, otherwise intact throughout, DTRs 2+ throughout, no cerebellar signs, gait normal Psychiatric: normal affect, behavior normal, pleasant  Breasts: getting annual mammogram, no palpable abnormality/lymphadenopathy GU: No concerns, defer  EKG: NSR, PACs   Izora Ribas, NP   09/07/2021

## 2021-09-07 ENCOUNTER — Other Ambulatory Visit: Payer: Self-pay

## 2021-09-07 ENCOUNTER — Encounter: Payer: Self-pay | Admitting: Adult Health

## 2021-09-07 ENCOUNTER — Ambulatory Visit (INDEPENDENT_AMBULATORY_CARE_PROVIDER_SITE_OTHER): Payer: Medicare HMO | Admitting: Adult Health

## 2021-09-07 VITALS — BP 110/66 | HR 86 | Temp 97.5°F | Ht 59.0 in | Wt 141.4 lb

## 2021-09-07 DIAGNOSIS — E1022 Type 1 diabetes mellitus with diabetic chronic kidney disease: Secondary | ICD-10-CM | POA: Diagnosis not present

## 2021-09-07 DIAGNOSIS — Z0000 Encounter for general adult medical examination without abnormal findings: Secondary | ICD-10-CM | POA: Diagnosis not present

## 2021-09-07 DIAGNOSIS — Z136 Encounter for screening for cardiovascular disorders: Secondary | ICD-10-CM

## 2021-09-07 DIAGNOSIS — Z0001 Encounter for general adult medical examination with abnormal findings: Secondary | ICD-10-CM | POA: Diagnosis not present

## 2021-09-07 DIAGNOSIS — D649 Anemia, unspecified: Secondary | ICD-10-CM

## 2021-09-07 DIAGNOSIS — E785 Hyperlipidemia, unspecified: Secondary | ICD-10-CM | POA: Diagnosis not present

## 2021-09-07 DIAGNOSIS — Z79899 Other long term (current) drug therapy: Secondary | ICD-10-CM

## 2021-09-07 DIAGNOSIS — E1140 Type 2 diabetes mellitus with diabetic neuropathy, unspecified: Secondary | ICD-10-CM

## 2021-09-07 DIAGNOSIS — N182 Chronic kidney disease, stage 2 (mild): Secondary | ICD-10-CM | POA: Diagnosis not present

## 2021-09-07 DIAGNOSIS — Z9109 Other allergy status, other than to drugs and biological substances: Secondary | ICD-10-CM

## 2021-09-07 DIAGNOSIS — I10 Essential (primary) hypertension: Secondary | ICD-10-CM

## 2021-09-07 DIAGNOSIS — E1169 Type 2 diabetes mellitus with other specified complication: Secondary | ICD-10-CM | POA: Diagnosis not present

## 2021-09-07 DIAGNOSIS — Z1329 Encounter for screening for other suspected endocrine disorder: Secondary | ICD-10-CM | POA: Diagnosis not present

## 2021-09-07 DIAGNOSIS — G43109 Migraine with aura, not intractable, without status migrainosus: Secondary | ICD-10-CM

## 2021-09-07 DIAGNOSIS — E559 Vitamin D deficiency, unspecified: Secondary | ICD-10-CM | POA: Diagnosis not present

## 2021-09-07 DIAGNOSIS — F4321 Adjustment disorder with depressed mood: Secondary | ICD-10-CM

## 2021-09-07 DIAGNOSIS — K760 Fatty (change of) liver, not elsewhere classified: Secondary | ICD-10-CM

## 2021-09-07 DIAGNOSIS — E663 Overweight: Secondary | ICD-10-CM

## 2021-09-07 NOTE — Patient Instructions (Addendum)
Regina Cervantes , Thank you for taking time to come for your Annual Wellness Visit. I appreciate your ongoing commitment to your health goals. Please review the following plan we discussed and let me know if I can assist you in the future.   These are the goals we discussed:  Goals      Blood Pressure < 130/80     DIET - INCREASE WATER INTAKE     Try to get in 4-5 bottles of water daily      Exercise 150 min/wk Moderate Activity     LDL CALC < 70     Weight (lb) < 140 lb (63.5 kg)        This is a list of the screening recommended for you and due dates:  Health Maintenance  Topic Date Due   Eye exam for diabetics  06/27/2020   Zoster (Shingles) Vaccine (1 of 2) 12/06/2021*   Tetanus Vaccine  09/07/2022*   Hemoglobin A1C  10/14/2021   Cologuard (Stool DNA test)  06/28/2022   Complete foot exam   09/07/2022   Mammogram  01/29/2023   Pneumonia Vaccine  Completed   Flu Shot  Completed   DEXA scan (bone density measurement)  Completed   COVID-19 Vaccine  Completed   Hepatitis C Screening: USPSTF Recommendation to screen - Ages 16-79 yo.  Completed   HPV Vaccine  Aged Out   Colon Cancer Screening  Discontinued  *Topic was postponed. The date shown is not the original due date.        Look up the [place] refers to food and lifestyle choices that are based on the traditions of countries located on the [employer]. It focuses on eating more fruits, vegetables, whole grains, beans, nuts, seeds, and heart-healthy fats, and eating less dairy, meat, eggs, and processed foods with added sugar, salt, and fat. This way of eating has been shown to help prevent certain conditions and improve outcomes for people who have chronic diseases, like kidney disease and heart disease. What are tips for following this plan? Reading food labels Check the serving size of packaged foods. For foods such as rice and pasta, the serving size refers to  the amount of cooked product, not dry. Check the total fat in packaged foods. Avoid foods that have saturated fat or trans fats. Check the ingredient list for added sugars, such as corn syrup. Shopping  Buy a variety of foods that offer a balanced diet, including: Fresh fruits and vegetables (produce). Grains, beans, nuts, and seeds. Some of these may be available in unpackaged forms or large amounts (in bulk). Fresh seafood. Poultry and eggs. Low-fat dairy products. Buy whole ingredients instead of prepackaged foods. Buy fresh fruits and vegetables in-season from local farmers markets. Buy plain frozen fruits and vegetables. If you do not have access to quality fresh seafood, buy precooked frozen shrimp or canned fish, such as tuna, salmon, or sardines. Stock your pantry so you always have certain foods on hand, such as olive oil, canned tuna, canned tomatoes, rice, pasta, and beans. Cooking Cook foods with extra-virgin olive oil instead of using butter or other vegetable oils. Have meat as a side dish, and have vegetables or grains as your main dish. This means having meat in small portions or adding small amounts of meat to foods like pasta or stew. Use beans or vegetables instead of meat in common dishes like chili or lasagna. Experiment with different  cooking methods. Try roasting, broiling, steaming, and sauting vegetables. Add frozen vegetables to soups, stews, pasta, or rice. Add nuts or seeds for added healthy fats and plant protein at each meal. You can add these to yogurt, salads, or vegetable dishes. Marinate fish or vegetables using olive oil, lemon juice, garlic, and fresh herbs. Meal planning Plan to eat one vegetarian meal one day each week. Try to work up to two vegetarian meals, if possible. Eat seafood two or more times a week. Have healthy snacks readily available, such as: Vegetable sticks with hummus. Greek yogurt. Fruit and nut trail mix. Eat balanced meals  throughout the week. This includes: Fruit: 2-3 servings a day. Vegetables: 4-5 servings a day. Low-fat dairy: 2 servings a day. Fish, poultry, or lean meat: 1 serving a day. Beans and legumes: 2 or more servings a week. Nuts and seeds: 1-2 servings a day. Whole grains: 6-8 servings a day. Extra-virgin olive oil: 3-4 servings a day. Limit red meat and sweets to only a few servings a month. Lifestyle  Cook and eat meals together with your family, when possible. Drink enough fluid to keep your urine pale yellow. Be physically active every day. This includes: Aerobic exercise like running or swimming. Leisure activities like gardening, walking, or housework. Get 7-8 hours of sleep each night. If recommended by your health care provider, drink red wine in moderation. This means 1 glass a day for nonpregnant women and 2 glasses a day for men. A glass of wine equals 5 oz (150 mL). What foods should I eat? Fruits Apples. Apricots. Avocado. Berries. Bananas. Cherries. Dates. Figs. Grapes. Lemons. Melon. Oranges. Peaches. Plums. Pomegranate. Vegetables Artichokes. Beets. Broccoli. Cabbage. Carrots. Eggplant. Green beans. Chard. Kale. Spinach. Onions. Leeks. Peas. Squash. Tomatoes. Peppers. Radishes. Grains Whole-grain pasta. Brown rice. Bulgur wheat. Polenta. Couscous. Whole-wheat bread. [place]. Meats and other proteins Beans. Almonds. Sunflower seeds. Pine nuts. Peanuts. [place]. Salmon. Scallops. Shrimp. [place]. Tilapia. Clams. Oysters. Eggs. Poultry without skin. Dairy Low-fat milk. Cheese. Greek yogurt. Fats and oils Extra-virgin olive oil. Avocado oil. Grapeseed oil. Beverages Water. Red wine. Herbal tea. Sweets and desserts Greek yogurt with honey. Baked apples. Poached pears. Trail mix. Seasonings and condiments Basil. Cilantro. Coriander. Cumin. Mint. Parsley. Sage. Rosemary. Tarragon. Garlic. Oregano. Thyme. Pepper. Balsamic vinegar. Tahini. Hummus. Tomato sauce. Olives.  Mushrooms. The items listed above may not be a complete list of foods and beverages you can eat. Contact a dietitian for more information. What foods should I limit? This is a list of foods that should be eaten rarely or only on special occasions. Fruits Fruit canned in syrup. Vegetables Deep-fried potatoes (french fries). Grains Prepackaged pasta or rice dishes. Prepackaged cereal with added sugar. Prepackaged snacks with added sugar. Meats and other proteins Beef. Pork. Lamb. Poultry with skin. Hot dogs. [place] Salines. Dairy Ice cream. Sour cream. Whole milk. Fats and oils Butter. Canola oil. Vegetable oil. Beef fat (tallow). Lard. Beverages Juice. Sugar-sweetened soft drinks. Beer. Liquor and spirits. Sweets and desserts Cookies. Cakes. Pies. Candy. Seasonings and condiments Mayonnaise. Pre-made sauces and marinades. The items listed above may not be a complete list of foods and beverages you should limit. Contact a dietitian for more information. Summary The Mediterranean diet includes both food and lifestyle choices. Eat a variety of fresh fruits and vegetables, beans, nuts, seeds, and whole grains. Limit the amount of red meat and sweets that you eat. If recommended by your health care provider, drink red wine in moderation. This means 1 glass a  day for nonpregnant women and 2 glasses a day for men. A glass of wine equals 5 oz (150 mL). This information is not intended to replace advice given to you by your health care provider. Make sure you discuss any questions you have with your health care provider. Document Revised: 10/11/2019 Document Reviewed: 08/08/2019 Elsevier Patient Education  2022 [employer].

## 2021-09-08 ENCOUNTER — Other Ambulatory Visit: Payer: Self-pay | Admitting: Adult Health

## 2021-09-08 DIAGNOSIS — E876 Hypokalemia: Secondary | ICD-10-CM | POA: Insufficient documentation

## 2021-09-08 LAB — CBC WITH DIFFERENTIAL/PLATELET
Absolute Monocytes: 676 cells/uL (ref 200–950)
Basophils Absolute: 52 cells/uL (ref 0–200)
Basophils Relative: 0.5 %
Eosinophils Absolute: 281 cells/uL (ref 15–500)
Eosinophils Relative: 2.7 %
HCT: 38.6 % (ref 35.0–45.0)
Hemoglobin: 12.7 g/dL (ref 11.7–15.5)
Lymphs Abs: 3422 cells/uL (ref 850–3900)
MCH: 27.1 pg (ref 27.0–33.0)
MCHC: 32.9 g/dL (ref 32.0–36.0)
MCV: 82.3 fL (ref 80.0–100.0)
MPV: 10.3 fL (ref 7.5–12.5)
Monocytes Relative: 6.5 %
Neutro Abs: 5970 cells/uL (ref 1500–7800)
Neutrophils Relative %: 57.4 %
Platelets: 294 10*3/uL (ref 140–400)
RBC: 4.69 10*6/uL (ref 3.80–5.10)
RDW: 13.8 % (ref 11.0–15.0)
Total Lymphocyte: 32.9 %
WBC: 10.4 10*3/uL (ref 3.8–10.8)

## 2021-09-08 LAB — COMPLETE METABOLIC PANEL WITH GFR
AG Ratio: 1.6 (calc) (ref 1.0–2.5)
ALT: 10 U/L (ref 6–29)
AST: 14 U/L (ref 10–35)
Albumin: 4.5 g/dL (ref 3.6–5.1)
Alkaline phosphatase (APISO): 78 U/L (ref 37–153)
BUN: 12 mg/dL (ref 7–25)
CO2: 29 mmol/L (ref 20–32)
Calcium: 9.9 mg/dL (ref 8.6–10.4)
Chloride: 96 mmol/L — ABNORMAL LOW (ref 98–110)
Creat: 0.61 mg/dL (ref 0.60–1.00)
Globulin: 2.9 g/dL (calc) (ref 1.9–3.7)
Glucose, Bld: 110 mg/dL — ABNORMAL HIGH (ref 65–99)
Potassium: 3.3 mmol/L — ABNORMAL LOW (ref 3.5–5.3)
Sodium: 134 mmol/L — ABNORMAL LOW (ref 135–146)
Total Bilirubin: 0.5 mg/dL (ref 0.2–1.2)
Total Protein: 7.4 g/dL (ref 6.1–8.1)
eGFR: 95 mL/min/{1.73_m2} (ref >=60)

## 2021-09-08 LAB — URINALYSIS, ROUTINE W REFLEX MICROSCOPIC
Bacteria, UA: NONE SEEN /HPF
Bilirubin Urine: NEGATIVE
Glucose, UA: NEGATIVE
Hgb urine dipstick: NEGATIVE
Hyaline Cast: NONE SEEN /LPF
Ketones, ur: NEGATIVE
Nitrite: NEGATIVE
Protein, ur: NEGATIVE
RBC / HPF: NONE SEEN /HPF (ref 0–2)
Specific Gravity, Urine: 1.012 (ref 1.001–1.035)
pH: 6.5 (ref 5.0–8.0)

## 2021-09-08 LAB — MICROALBUMIN / CREATININE URINE RATIO
Creatinine, Urine: 61 mg/dL (ref 20–275)
Microalb Creat Ratio: 39 mcg/mg creat — ABNORMAL HIGH (ref <30)
Microalb, Ur: 2.4 mg/dL

## 2021-09-08 LAB — LIPID PANEL
Cholesterol: 134 mg/dL (ref <200)
HDL: 47 mg/dL — ABNORMAL LOW (ref >=50)
LDL Cholesterol (Calc): 64 mg/dL (calc)
Non-HDL Cholesterol (Calc): 87 mg/dL (calc) (ref <130)
Total CHOL/HDL Ratio: 2.9 (calc) (ref <5.0)
Triglycerides: 148 mg/dL (ref <150)

## 2021-09-08 LAB — MAGNESIUM: Magnesium: 1.9 mg/dL (ref 1.5–2.5)

## 2021-09-08 LAB — IRON,TIBC AND FERRITIN PANEL
%SAT: 15 % (calc) — ABNORMAL LOW (ref 16–45)
Ferritin: 35 ng/mL (ref 16–288)
Iron: 56 ug/dL (ref 45–160)
TIBC: 384 mcg/dL (calc) (ref 250–450)

## 2021-09-08 LAB — VITAMIN B12: Vitamin B-12: 1031 pg/mL (ref 200–1100)

## 2021-09-08 LAB — VITAMIN D 25 HYDROXY (VIT D DEFICIENCY, FRACTURES): Vit D, 25-Hydroxy: 104 ng/mL — ABNORMAL HIGH (ref 30–100)

## 2021-09-08 LAB — HEMOGLOBIN A1C
Hgb A1c MFr Bld: 6.3 % of total Hgb — ABNORMAL HIGH (ref <5.7)
Mean Plasma Glucose: 134 mg/dL
eAG (mmol/L): 7.4 mmol/L

## 2021-09-08 LAB — TSH: TSH: 2.56 mIU/L (ref 0.40–4.50)

## 2021-09-08 LAB — MICROSCOPIC MESSAGE

## 2021-09-08 MED ORDER — VITAMIN D 50 MCG (2000 UT) PO CAPS: 4000.0000 [IU] | ORAL_CAPSULE | Freq: Every day | ORAL | Status: AC

## 2021-09-08 MED ORDER — POTASSIUM CHLORIDE CRYS ER 10 MEQ PO TBCR: EXTENDED_RELEASE_TABLET | ORAL | 3 refills | Status: DC

## 2021-09-08 NOTE — Progress Notes (Signed)
Left message with husband asking for wife to call us back when she was able to,

## 2021-09-14 DIAGNOSIS — M79641 Pain in right hand: Secondary | ICD-10-CM | POA: Insufficient documentation

## 2021-09-15 DIAGNOSIS — M19049 Primary osteoarthritis, unspecified hand: Secondary | ICD-10-CM | POA: Insufficient documentation

## 2021-09-15 DIAGNOSIS — G5603 Carpal tunnel syndrome, bilateral upper limbs: Secondary | ICD-10-CM | POA: Insufficient documentation

## 2021-09-15 DIAGNOSIS — M13849 Other specified arthritis, unspecified hand: Secondary | ICD-10-CM | POA: Diagnosis not present

## 2021-09-16 ENCOUNTER — Other Ambulatory Visit: Payer: Self-pay | Admitting: Adult Health

## 2021-09-16 DIAGNOSIS — E1140 Type 2 diabetes mellitus with diabetic neuropathy, unspecified: Secondary | ICD-10-CM

## 2021-09-23 ENCOUNTER — Encounter: Payer: Self-pay | Admitting: Podiatry

## 2021-09-23 ENCOUNTER — Ambulatory Visit: Payer: Medicare HMO | Admitting: Podiatry

## 2021-09-23 ENCOUNTER — Other Ambulatory Visit: Payer: Self-pay

## 2021-09-23 DIAGNOSIS — D2371 Other benign neoplasm of skin of right lower limb, including hip: Secondary | ICD-10-CM

## 2021-09-23 NOTE — Progress Notes (Signed)
She presents today for follow-up of a callus beneath her hallux right foot.  States that is doing better than it was last visit.  Objective: Vitals are stable alert oriented x3 she has an extended hallux at the interphalangeal joint of her right foot resulting in pressure lesion subhallux interphalangeal joint right foot.  No open lesions or wounds are noted.  Assessment: Benign skin lesion plantar aspect hallux right.  Plan: Debrided reactive hyperkeratotic lesion/benign skin lesion right foot

## 2021-09-24 ENCOUNTER — Other Ambulatory Visit: Payer: Self-pay | Admitting: Adult Health

## 2021-09-24 ENCOUNTER — Ambulatory Visit: Payer: Medicare HMO

## 2021-09-24 DIAGNOSIS — E876 Hypokalemia: Secondary | ICD-10-CM | POA: Diagnosis not present

## 2021-09-24 NOTE — Progress Notes (Signed)
The patient came in today for repeat BMP to recheck her potassium level. The patient reports that she has been taking her potassium three times daily with the exception of not taking her morning dose this morning before blood draw. She reports she has not taken any of her morning medications today. She reports no new issues or concerns.

## 2021-09-25 LAB — BASIC METABOLIC PANEL WITH GFR
BUN: 12 mg/dL (ref 7–25)
CO2: 28 mmol/L (ref 20–32)
Calcium: 9.7 mg/dL (ref 8.6–10.4)
Chloride: 99 mmol/L (ref 98–110)
Creat: 0.72 mg/dL (ref 0.60–1.00)
Glucose, Bld: 132 mg/dL — ABNORMAL HIGH (ref 65–99)
Potassium: 3.8 mmol/L (ref 3.5–5.3)
Sodium: 137 mmol/L (ref 135–146)
eGFR: 89 mL/min/{1.73_m2} (ref >=60)

## 2021-10-08 DIAGNOSIS — G5603 Carpal tunnel syndrome, bilateral upper limbs: Secondary | ICD-10-CM | POA: Diagnosis not present

## 2021-11-12 DIAGNOSIS — G5601 Carpal tunnel syndrome, right upper limb: Secondary | ICD-10-CM | POA: Diagnosis not present

## 2021-11-23 ENCOUNTER — Ambulatory Visit: Payer: Medicare HMO | Admitting: Podiatry

## 2021-11-23 ENCOUNTER — Other Ambulatory Visit: Payer: Self-pay

## 2021-11-23 DIAGNOSIS — D2371 Other benign neoplasm of skin of right lower limb, including hip: Secondary | ICD-10-CM

## 2021-11-23 NOTE — Progress Notes (Signed)
She presents today for a callus to the right foot which can be painful.  She uses corn pads to put on it she is also concerned about a bilateral toe numbness states that it is worse than the left 1 as she points to the right 1. ? ?Objective: Vital signs are stable alert and oriented x3.  Pulses are palpable.  There is no erythema edema cellulitis drainage or odor reactive hyperkeratotic lesion plantar aspect of the foot demonstrates no open lesions or wounds. ? ?Assessment: Painful hallux benign lesion right. ? ?Plan: Debridement of benign lesion today placed padding follow-up with her in a few months. ?

## 2021-11-26 DIAGNOSIS — M25641 Stiffness of right hand, not elsewhere classified: Secondary | ICD-10-CM | POA: Diagnosis not present

## 2022-01-07 NOTE — Progress Notes (Signed)
ANNUAL WELLNESS VISIT AND FOLLOW UP ? ? ?Assessment:  ? ?Annual Medicare Wellness Visit ?Due annually  ?Health maintenance reviewed ?- schedule mammogram, requesting diabetes eye exam report  ? ?Essential hypertension ?- continue medications, DASH diet, exercise and monitor at home. Call if greater than 130/80.  ?- CBC with Differential/Platelet ?- CMP/GFR ?- TSH ? ?Type 2 diabetes mellitus with sensory neuropathy (HCC) ?Discussed general issues about diabetes pathophysiology and management., Educational material distributed., Suggested low cholesterol diet., Encouraged aerobic exercise., Discussed foot care., Reminded to get yearly retinal exam.- report requested ?- Hemoglobin A1c ? ?Hyperlipidemia associated with T2DM ([place]) ?-continue medications, check lipids, decrease fatty foods, increase activity.  ?- LDL goal <70 ?- Lipid panel ? ?CKD 2 associated with T2DM ([place]) ?On ACEi; Increase fluids, avoid NSAIDS, monitor sugars, will monitor ?- CMP/GFR ?- microalbumin recheck due to elevation last  ? ?Neuropathy associated with T2DM ([place]) ?Control sugars, check feet daily.  ?Podiatry follows  ? ?Vitamin D deficiency ?Continue supplement, check at CPE ? ? Medication management ?- Magnesium ? ?Meniere's disease, unspecified laterality ?Continue HCTZ, monitor kidney function ?Will refer to new ENT, Dr Lucia Gaskins retired ? ?Overweight with comorbidities- BMI 28 ?- long discussion about weight loss, diet, and exercise ?- doing well with lifestyle, appetitie is down, off of phentermine ? ?Fatty liver ?Weight loss advised, avoid alcohol/tylenol, will monitor LFTs ? ?Environmental allegies ?Continue OTC antihistamine, singulair ?Hygiene reviewed ? ?Situational depression ?Declines meds, managing well with lifestyle  ? ?Ophthalmic migraine ?Occasional, stress related; doing well with conservative therapy - rest/ibuprofen ? ?Orders Placed This Encounter  ?Procedures  ? CBC with Differential/Platelet  ? COMPLETE METABOLIC PANEL WITH  GFR  ? Magnesium  ? Lipid panel  ? TSH  ? Hemoglobin A1c  ? Microalbumin / creatinine urine ratio  ? Ambulatory referral to ENT  ? ? ?Over 30 minutes of exam, counseling, chart review, and critical decision making was performed ? ?Future Appointments  ?Date Time Provider [place]  ?01/27/2022  9:15 AM Hyatt, Romilda Garret, DPM [facility]  ?09/07/2022 10:00 AM [name], NP [facility] None  ?01/12/2023 10:00 AM Darrol Jump, NP [facility] None  ? ? ?Plan:  ? ?During the course of the visit the patient was educated and counseled about appropriate screening and preventive services including:  ? ?Pneumococcal vaccine  ?Influenza vaccine ?Td vaccine ?Prevnar 13 ?Screening electrocardiogram ?Screening mammography ?Bone densitometry screening ?Colorectal cancer screening ?Diabetes screening ?Glaucoma screening ?Nutrition counseling  ?Advanced directives: given info/requested copies ? ? ?Subjective:  ? ?Regina Cervantes is a 71 y.o. female who presents for AWV and follow up. She has Hyperlipidemia associated with type 2 diabetes mellitus ([place]); Hypertension; Vitamin D deficiency; Meniere's disease; Medication management; Overweight (BMI 25.0-29.9); Type 2 diabetes mellitus with sensory neuropathy ([place]); Fatty liver; CKD stage 2 due to type 1 diabetes mellitus ([place]); Environmental allergies; Situational depression; Neuropathy due to type 2 diabetes mellitus ([place]); Ophthalmic migraine; Hypokalemia; Arthritis of hand; Bilateral carpal tunnel syndrome; and Pain in right hand on their problem list. ? ?She is married, 3 sons, youngest passed at 80 with ruptured pancreatic cyst, 3 grandchildren.  ? ?Sister passed due to metastatic kidney cancer in 2021. Brother was diagnosed with esophageal cancer but doing well with chemo/radiation.  Feels she is managing mood fairly, has seen improvement this year, she is taking melatonin 3 mg for sleep which is is working well. Has had some occasional ophthalmic migraines  improved, Rest and ibuprofen helps.  ? ?She has seen Dr. Lucia Gaskins for  her meniere's, she is back on her HCTZ. ? ?She has carpal tunnel bothering her more, has appointment to see Dr. Amedeo Plenty later this month. Wearing bil braces without improvement.  ? ?BMI is Body mass index is 28.48 kg/m?., she is working on diet and exercise, doing walking 3 days a week, generally active on her feel all day. Watches portions. Off of phentermine, 1/2 tab works very well if needed.  ?Wt Readings from Last 3 Encounters:  ?01/11/22 141 lb (64 kg)  ?09/24/21 139 lb 3.2 oz (63.1 kg)  ?09/07/21 141 lb 6.4 oz (64.1 kg)  ? ?Her blood pressure has been controlled at home, today their BP is BP: 130/70 ? She does workout, generally active. She denies chest pain, shortness of breath, dizziness.  ? ? She is on cholesterol medication (rosuvastatain 10 mg daily) and denies myalgias. Her cholesterol is at goal. The cholesterol last visit was:   ?Lab Results  ?Component Value Date  ? CHOL 134 09/07/2021  ? HDL 47 (L) 09/07/2021  ? [place] 64 09/07/2021  ? TRIG 148 09/07/2021  ? CHOLHDL 2.9 09/07/2021  ? ? She was first diagnosed with DM 10-15 years ago, has been working on diet and exercise for diabetes with DM neuropathy (tingling, manages with topicals, podiatry Dr. Milinda Pointer following) and CKD II on ACEi, she has done a great job bringing down her A1C with weight loss/phentermine, she is on metformin, bASA, and denies polydipsia, polyuria and visual disturbances. Taking metformin 500 mg 3 tabs/day due to GI sx. She does check fasting sugars occasionally, ranging 90-110 or so. Last A1C in the office was:  ?Lab Results  ?Component Value Date  ? HGBA1C 6.3 (H) 09/07/2021  ? ? CKD II associated with T2DM, monitored at this office, olmesartan. Last GFR:  ?Lab Results  ?Component Value Date  ? EGFR 89 09/24/2021  ? ?Lab Results  ?Component Value Date  ? MICRALBCREAT 39 (H) 09/07/2021  ? MICRALBCREAT 10 09/07/2020  ? MICRALBCREAT 18 09/06/2019  ? ?Patient is  on Vitamin D supplement, reduced from 6000 IU to 4000 IU ?Lab Results  ?Component Value Date  ? VD25OH 104 (H) 09/07/2021  ?   ?She takes B12 supplement  ?Lab Results  ?Component Value Date  ? CHENIDPO24 1,031 09/07/2021  ? ?Has been on slow release iron daily for many years, 50 fe.  ?Lab Results  ?Component Value Date  ? IRON 56 09/07/2021  ? TIBC 384 09/07/2021  ? FERRITIN 35 09/07/2021  ? ? ? ? ?Medication Review ?Current Outpatient Medications on File Prior to Visit  ?Medication Sig  ? aspirin 81 MG chewable tablet Chew 81 mg by mouth every other day.  ? BAYER MICROLET LANCETS lancets Use as instructed  ? bisoprolol-hydrochlorothiazide (ZIAC) 5-6.25 MG tablet TAKE 1 TABLET BY MOUTH EVERY DAY FOR BLOOD PRESSURE  ? Blood Glucose Monitoring Suppl (CONTOUR NEXT MONITOR) w/Device KIT USE TO CHECK BLOOD SUGAR DAILY  ? cetirizine (ZYRTEC) 10 MG tablet Take 10 mg by mouth daily.  ? Cholecalciferol (VITAMIN D) 50 MCG (2000 UT) CAPS Take 2 capsules (4,000 Units total) by mouth daily.  ? ferrous sulfate dried (SLOW FE) 160 (50 FE) MG TBCR SR tablet Take 160 mg by mouth daily.  ? glucose blood (ONETOUCH ULTRA) test strip CHECK BLOOD SUGAR DAILY  ? hydrochlorothiazide (HYDRODIURIL) 25 MG tablet TAKE 1 TABLET DAILY FOR BLOOD PRESSURE & FLUID RETENTION (ANKLE SWELLING)  ? Magnesium 500 MG CAPS Take 500 mg by mouth 3 (three) times daily.   ?  meclizine (ANTIVERT) 25 MG tablet Take 1 tablet 3 x / day as needed for Dizziness  ? melatonin 3 MG TABS tablet Take 3 mg by mouth at bedtime.  ? metFORMIN (GLUCOPHAGE-XR) 500 MG 24 hr tablet TAKE 2 TABLETS 2 TIMES A DAY WITH MEALS FOR DIABETES  ? olmesartan (BENICAR) 40 MG tablet Take  1 tablet  Daily  for BP               /TAKE ONE TABLET BY MOUTH DAILY FOR BLOOD PRESSURE  ? ondansetron (ZOFRAN) 8 MG tablet Take 8 mg by mouth every 8 (eight) hours as needed.  ? potassium chloride (KLOR-CON M) 10 MEQ tablet Take 1 tab daily for low potassium.  ? promethazine (PHENERGAN) 25 MG tablet Take 1  tablet every 4 hours if needed for nausea or dizziness  ? rosuvastatin (CRESTOR) 10 MG tablet Take  1 tablet  Daily  for Cholesterol                          /      TAKE 1 TABLET BY MOUTH EVERY DAY FOR C

## 2022-01-11 ENCOUNTER — Ambulatory Visit (INDEPENDENT_AMBULATORY_CARE_PROVIDER_SITE_OTHER): Payer: Medicare HMO | Admitting: Adult Health

## 2022-01-11 ENCOUNTER — Encounter: Payer: Self-pay | Admitting: Adult Health

## 2022-01-11 VITALS — BP 130/70 | HR 89 | Temp 97.3°F | Wt 141.0 lb

## 2022-01-11 DIAGNOSIS — E559 Vitamin D deficiency, unspecified: Secondary | ICD-10-CM

## 2022-01-11 DIAGNOSIS — E785 Hyperlipidemia, unspecified: Secondary | ICD-10-CM | POA: Diagnosis not present

## 2022-01-11 DIAGNOSIS — G43109 Migraine with aura, not intractable, without status migrainosus: Secondary | ICD-10-CM | POA: Diagnosis not present

## 2022-01-11 DIAGNOSIS — Z0001 Encounter for general adult medical examination with abnormal findings: Secondary | ICD-10-CM

## 2022-01-11 DIAGNOSIS — E1169 Type 2 diabetes mellitus with other specified complication: Secondary | ICD-10-CM

## 2022-01-11 DIAGNOSIS — E1140 Type 2 diabetes mellitus with diabetic neuropathy, unspecified: Secondary | ICD-10-CM

## 2022-01-11 DIAGNOSIS — I10 Essential (primary) hypertension: Secondary | ICD-10-CM | POA: Diagnosis not present

## 2022-01-11 DIAGNOSIS — E876 Hypokalemia: Secondary | ICD-10-CM

## 2022-01-11 DIAGNOSIS — Z79899 Other long term (current) drug therapy: Secondary | ICD-10-CM | POA: Diagnosis not present

## 2022-01-11 DIAGNOSIS — E1022 Type 1 diabetes mellitus with diabetic chronic kidney disease: Secondary | ICD-10-CM

## 2022-01-11 DIAGNOSIS — R6889 Other general symptoms and signs: Secondary | ICD-10-CM | POA: Diagnosis not present

## 2022-01-11 DIAGNOSIS — H8109 Meniere's disease, unspecified ear: Secondary | ICD-10-CM

## 2022-01-11 DIAGNOSIS — K760 Fatty (change of) liver, not elsewhere classified: Secondary | ICD-10-CM | POA: Diagnosis not present

## 2022-01-11 DIAGNOSIS — E663 Overweight: Secondary | ICD-10-CM

## 2022-01-11 DIAGNOSIS — Z0000 Encounter for general adult medical examination without abnormal findings: Secondary | ICD-10-CM

## 2022-01-11 DIAGNOSIS — Z9109 Other allergy status, other than to drugs and biological substances: Secondary | ICD-10-CM

## 2022-01-11 DIAGNOSIS — F4321 Adjustment disorder with depressed mood: Secondary | ICD-10-CM

## 2022-01-11 DIAGNOSIS — N182 Chronic kidney disease, stage 2 (mild): Secondary | ICD-10-CM | POA: Diagnosis not present

## 2022-01-12 LAB — MICROALBUMIN / CREATININE URINE RATIO
Creatinine, Urine: 26 mg/dL (ref 20–275)
Microalb Creat Ratio: 8 mcg/mg creat (ref <30)
Microalb, Ur: 0.2 mg/dL

## 2022-01-12 LAB — COMPLETE METABOLIC PANEL WITH GFR
AG Ratio: 1.6 (calc) (ref 1.0–2.5)
ALT: 11 U/L (ref 6–29)
AST: 15 U/L (ref 10–35)
Albumin: 4.6 g/dL (ref 3.6–5.1)
Alkaline phosphatase (APISO): 76 U/L (ref 37–153)
BUN/Creatinine Ratio: 27 (calc) — ABNORMAL HIGH (ref 6–22)
BUN: 16 mg/dL (ref 7–25)
CO2: 28 mmol/L (ref 20–32)
Calcium: 10.3 mg/dL (ref 8.6–10.4)
Chloride: 99 mmol/L (ref 98–110)
Creat: 0.59 mg/dL — ABNORMAL LOW (ref 0.60–1.00)
Globulin: 2.9 g/dL (calc) (ref 1.9–3.7)
Glucose, Bld: 114 mg/dL — ABNORMAL HIGH (ref 65–99)
Potassium: 4.3 mmol/L (ref 3.5–5.3)
Sodium: 137 mmol/L (ref 135–146)
Total Bilirubin: 0.5 mg/dL (ref 0.2–1.2)
Total Protein: 7.5 g/dL (ref 6.1–8.1)
eGFR: 96 mL/min/{1.73_m2} (ref >=60)

## 2022-01-12 LAB — CBC WITH DIFFERENTIAL/PLATELET
Absolute Monocytes: 498 cells/uL (ref 200–950)
Basophils Absolute: 50 cells/uL (ref 0–200)
Basophils Relative: 0.6 %
Eosinophils Absolute: 183 cells/uL (ref 15–500)
Eosinophils Relative: 2.2 %
HCT: 38.2 % (ref 35.0–45.0)
Hemoglobin: 12.4 g/dL (ref 11.7–15.5)
Lymphs Abs: 2631 cells/uL (ref 850–3900)
MCH: 26.9 pg — ABNORMAL LOW (ref 27.0–33.0)
MCHC: 32.5 g/dL (ref 32.0–36.0)
MCV: 82.9 fL (ref 80.0–100.0)
MPV: 10.5 fL (ref 7.5–12.5)
Monocytes Relative: 6 %
Neutro Abs: 4939 cells/uL (ref 1500–7800)
Neutrophils Relative %: 59.5 %
Platelets: 259 10*3/uL (ref 140–400)
RBC: 4.61 10*6/uL (ref 3.80–5.10)
RDW: 14.1 % (ref 11.0–15.0)
Total Lymphocyte: 31.7 %
WBC: 8.3 10*3/uL (ref 3.8–10.8)

## 2022-01-12 LAB — LIPID PANEL
Cholesterol: 155 mg/dL (ref <200)
HDL: 51 mg/dL (ref >=50)
LDL Cholesterol (Calc): 80 mg/dL (calc)
Non-HDL Cholesterol (Calc): 104 mg/dL (calc) (ref <130)
Total CHOL/HDL Ratio: 3 (calc) (ref <5.0)
Triglycerides: 137 mg/dL (ref <150)

## 2022-01-12 LAB — HEMOGLOBIN A1C
Hgb A1c MFr Bld: 6 % of total Hgb — ABNORMAL HIGH (ref <5.7)
Mean Plasma Glucose: 126 mg/dL
eAG (mmol/L): 7 mmol/L

## 2022-01-12 LAB — MAGNESIUM: Magnesium: 1.8 mg/dL (ref 1.5–2.5)

## 2022-01-12 LAB — TSH: TSH: 2.34 mIU/L (ref 0.40–4.50)

## 2022-01-25 ENCOUNTER — Other Ambulatory Visit: Payer: Self-pay | Admitting: Internal Medicine

## 2022-01-25 DIAGNOSIS — Z1231 Encounter for screening mammogram for malignant neoplasm of breast: Secondary | ICD-10-CM

## 2022-01-27 ENCOUNTER — Ambulatory Visit: Payer: Medicare HMO | Admitting: Podiatry

## 2022-02-07 ENCOUNTER — Ambulatory Visit
Admission: RE | Admit: 2022-02-07 | Discharge: 2022-02-07 | Disposition: A | Discharge status: Home / Self Care | Payer: Medicare HMO | Source: Ambulatory Visit | Attending: Internal Medicine | Admitting: Internal Medicine

## 2022-02-07 DIAGNOSIS — Z1231 Encounter for screening mammogram for malignant neoplasm of breast: Secondary | ICD-10-CM

## 2022-03-01 ENCOUNTER — Ambulatory Visit: Payer: Medicare HMO | Admitting: Podiatry

## 2022-03-14 DIAGNOSIS — H905 Unspecified sensorineural hearing loss: Secondary | ICD-10-CM | POA: Diagnosis not present

## 2022-03-14 DIAGNOSIS — H9313 Tinnitus, bilateral: Secondary | ICD-10-CM | POA: Diagnosis not present

## 2022-03-14 DIAGNOSIS — H9190 Unspecified hearing loss, unspecified ear: Secondary | ICD-10-CM | POA: Diagnosis not present

## 2022-03-14 DIAGNOSIS — H9042 Sensorineural hearing loss, unilateral, left ear, with unrestricted hearing on the contralateral side: Secondary | ICD-10-CM | POA: Diagnosis not present

## 2022-03-24 ENCOUNTER — Encounter: Payer: Self-pay | Admitting: Podiatry

## 2022-03-24 ENCOUNTER — Ambulatory Visit: Payer: Medicare HMO | Admitting: Podiatry

## 2022-03-24 DIAGNOSIS — M778 Other enthesopathies, not elsewhere classified: Secondary | ICD-10-CM

## 2022-03-24 DIAGNOSIS — D2371 Other benign neoplasm of skin of right lower limb, including hip: Secondary | ICD-10-CM

## 2022-03-24 MED ORDER — DEXAMETHASONE SODIUM PHOSPHATE 120 MG/30ML IJ SOLN
2.0000 mg | Freq: Once | INTRAMUSCULAR | Status: AC
  Administered 2022-03-24: 2 mg via INTRA_ARTICULAR

## 2022-03-24 NOTE — Progress Notes (Signed)
She presents today chief complaint of a painful dorsal aspect of the first metatarsal medial cuneiform joint as well as a painful hallux interphalangeal joint callus.  Objective: Vital signs are stable alert and oriented x3 pulses are palpable.  Osteoarthritis dorsal aspect of the right foot with tenderness on palpation of the deep peroneal nerves consistent with neuritis and capsulitis at that joint.  She also has an extension of the hallux interphalangeal joint consistent with a reactive hyperkeratotic lesion..  Assessment: Pain in limb secondary benign skin lesion and pain in limb secondary to capsulitis neuritis on the right foot.  Plan: I injected dexamethasone local anesthetic to the plantar maximal tenderness.  Debrided the reactive hyperkeratotic tissue placed padding.  Follow-up with her in 4 months

## 2022-04-13 ENCOUNTER — Encounter: Payer: Self-pay | Admitting: Internal Medicine

## 2022-05-04 ENCOUNTER — Ambulatory Visit (INDEPENDENT_AMBULATORY_CARE_PROVIDER_SITE_OTHER): Payer: Medicare HMO | Admitting: Nurse Practitioner

## 2022-05-04 ENCOUNTER — Encounter: Payer: Self-pay | Admitting: Nurse Practitioner

## 2022-05-04 VITALS — BP 132/72 | HR 90 | Temp 97.9°F | Resp 17 | Ht 59.0 in | Wt 136.2 lb

## 2022-05-04 DIAGNOSIS — I499 Cardiac arrhythmia, unspecified: Secondary | ICD-10-CM | POA: Diagnosis not present

## 2022-05-04 DIAGNOSIS — K760 Fatty (change of) liver, not elsewhere classified: Secondary | ICD-10-CM | POA: Diagnosis not present

## 2022-05-04 DIAGNOSIS — E559 Vitamin D deficiency, unspecified: Secondary | ICD-10-CM

## 2022-05-04 DIAGNOSIS — G43109 Migraine with aura, not intractable, without status migrainosus: Secondary | ICD-10-CM | POA: Diagnosis not present

## 2022-05-04 DIAGNOSIS — H8109 Meniere's disease, unspecified ear: Secondary | ICD-10-CM | POA: Diagnosis not present

## 2022-05-04 DIAGNOSIS — E1022 Type 1 diabetes mellitus with diabetic chronic kidney disease: Secondary | ICD-10-CM

## 2022-05-04 DIAGNOSIS — E1169 Type 2 diabetes mellitus with other specified complication: Secondary | ICD-10-CM | POA: Diagnosis not present

## 2022-05-04 DIAGNOSIS — E785 Hyperlipidemia, unspecified: Secondary | ICD-10-CM

## 2022-05-04 DIAGNOSIS — E663 Overweight: Secondary | ICD-10-CM | POA: Diagnosis not present

## 2022-05-04 DIAGNOSIS — E1140 Type 2 diabetes mellitus with diabetic neuropathy, unspecified: Secondary | ICD-10-CM | POA: Diagnosis not present

## 2022-05-04 DIAGNOSIS — Z79899 Other long term (current) drug therapy: Secondary | ICD-10-CM | POA: Diagnosis not present

## 2022-05-04 DIAGNOSIS — I10 Essential (primary) hypertension: Secondary | ICD-10-CM | POA: Diagnosis not present

## 2022-05-04 DIAGNOSIS — N182 Chronic kidney disease, stage 2 (mild): Secondary | ICD-10-CM

## 2022-05-04 MED ORDER — GABAPENTIN 100 MG PO CAPS: 100.0000 mg | ORAL_CAPSULE | Freq: Three times a day (TID) | ORAL | 0 refills | Status: DC

## 2022-05-04 NOTE — Progress Notes (Signed)
FOLLOW UP   Assessment:    Essential hypertension Discussed DASH (Dietary Approaches to Stop Hypertension) DASH diet is lower in sodium than a typical American diet. Cut back on foods that are high in saturated fat, cholesterol, and trans fats. Eat more whole-grain foods, fish, poultry, and nuts Remain active and exercise as tolerated daily.  Monitor BP at home-Call if greater than 130/80.  Check CMP/CBC   Type 2 diabetes mellitus with sensory neuropathy (HCC) Start Gabapentin. Continue to follow with podiatry for foot care. Education: Reviewed 'ABCs' of diabetes management  Discussed goals to be met and/or maintained include A1C (<7) Blood pressure (<130/80) Cholesterol (LDL <70) Continue Eye Exam yearly  Continue Dental Exam Q6 mo Discussed dietary recommendations Discussed Physical Activity recommendations Foot exam UTD Check A1C  Hyperlipidemia associated with T2DM (HCC) Discussed lifestyle modifications. Recommended diet heavy in fruits and veggies, omega 3's. Decrease consumption of animal meats, cheeses, and dairy products. Remain active and exercise as tolerated. Continue to monitor. Check lipids/TSH   CKD 2 associated with T2DM (HCC) Continue ARB, bASA, statin Discussed how what you eat and drink can aide in kidney protection. Stay well hydrated. Avoid high salt foods. Avoid NSAIDS. Keep BP and BG well controlled.   Take medications as prescribed. Remain active and exercise as tolerated daily. Maintain weight.  Continue to monitor. Check CMP/GFR/Microablumin  Vitamin D deficiency Check levels at CPE   Medication management All medications discussed and reviewed in full. All questions and concerns regarding medications addressed.     Meniere's disease, unspecified laterality Continue HCTZ, monitor kidney function Continue f/u with ENT, Dr Lucia Gaskins retired  Overweight with comorbidities- BMI 28 Discussed appropriate BMI Goal of losing 1 lb per  month. Diet modification. Physical activity. Encouraged/praised to build confidence.   Fatty liver Resolved Weight loss advised, avoid alcohol/tylenol. Monitor LFTs  Situational depression Declines meds, managing well with lifestyle   Ophthalmic migraine Occasional, stress related; doing well with conservative therapy - rest/ibuprofen  Cardiac Arrhythmia EKG - Sinus Arrhythmia Continue to monitor. Report to ER for any increase in stroke like symptoms, including HA, N/V, paralysis, difficulty speaking, trouble walking, confusion, vision changes, CP, heart palpitations, SOB, diaphoresis.   Orders Placed This Encounter  Procedures   CBC with Differential/Platelet   COMPLETE METABOLIC PANEL WITH GFR   Magnesium   Lipid panel   TSH   Hemoglobin A1c   EKG 12-Lead   Meds ordered this encounter  Medications   gabapentin (NEURONTIN) 100 MG capsule    Sig: Take 1 capsule (100 mg total) by mouth 3 (three) times daily.    Dispense:  90 capsule    Refill:  0    Order Specific Question:   Supervising Provider    Answer:   [name] [[id number]]   Over 20 minutes of exam, counseling, chart review, and critical decision making was performed  Future Appointments  Date Time Provider Uncertain  06/23/2022  9:45 AM Rudolph, Bennie Pierini T, DPM [facility]  09/07/2022 10:00 AM Darrol Jump, NP [facility] None  01/12/2023 10:00 AM Darrol Jump, NP [facility] None     Subjective:   Regina Cervantes is a 71 y.o. female who presents for 4 mo follow up. She has Hyperlipidemia associated with type 2 diabetes mellitus ([place]); Hypertension; Vitamin D deficiency; Meniere's disease; Medication management; Overweight (BMI 25.0-29.9); Type 2 diabetes mellitus with sensory neuropathy ([place]); Fatty liver; CKD stage 2 due to type 1 diabetes mellitus ([place]); Environmental allergies; Situational depression; Neuropathy due to type  2 diabetes mellitus ([place]); Ophthalmic migraine; Hypokalemia;  Arthritis of hand; Bilateral carpal tunnel syndrome; and Pain in right hand on their problem list.  Overall she reports feeling well in clinic today. She has no new or additional concerns.  She is married, 3 sons, youngest passed at 74 with ruptured pancreatic cyst, 3 grandchildren.   Sister passed due to metastatic kidney cancer in 2021. Brother was diagnosed with esophageal cancer but doing well with chemo/radiation.  Feels she is managing mood fairly, has seen improvement this year, she is taking melatonin 3 mg for sleep which is is working well. Has had some occasional ophthalmic migraines improved, Rest and ibuprofen helps.   She has seen Dr. Lucia Gaskins for her meniere's, she is back on her HCTZ.  She has carpal tunnel bothering her more, has appointment to see Dr. Amedeo Plenty later this month. Wearing bil braces without improvement.   BMI is Body mass index is 27.51 kg/m., she is working on diet and exercise, doing walking 3 days a week, generally active on her feel all day. Watches portions. Off of phentermine, 1/2 tab works very well if needed.  Wt Readings from Last 3 Encounters:  05/04/22 136 lb 3.2 oz (61.8 kg)  01/11/22 141 lb (64 kg)  09/24/21 139 lb 3.2 oz (63.1 kg)   Her blood pressure has been controlled at home, today their BP is BP: 132/72  She does workout, generally active. She denies chest pain, shortness of breath, dizziness, palpitations.   She is on cholesterol medication (rosuvastatain 10 mg daily) and denies myalgias. Her cholesterol is at goal. The cholesterol last visit was:   Lab Results  Component Value Date   CHOL 155 01/11/2022   HDL 51 01/11/2022   LDLCALC 80 01/11/2022   TRIG 137 01/11/2022   CHOLHDL 3.0 01/11/2022    She was first diagnosed with DM 10-15 years ago, has been working on diet and exercise for diabetes with DM neuropathy (tingling, manages with topicals, podiatry Dr. Milinda Pointer following) and CKD II on ACEi, she has done a great job bringing down her  A1C with weight loss/phentermine, she is on metformin, bASA, and denies polydipsia, polyuria and visual disturbances. Taking metformin 500 mg 3 tabs/day due to GI sx. She does check fasting sugars occasionally, ranging 90-110 or so. Last A1C in the office was:  Lab Results  Component Value Date   HGBA1C 6.0 (H) 01/11/2022    CKD II associated with T2DM, monitored at this office, olmesartan. Last GFR:  Lab Results  Component Value Date   EGFR 96 01/11/2022   Lab Results  Component Value Date   MICRALBCREAT 8 01/11/2022   MICRALBCREAT 39 (H) 09/07/2021   MICRALBCREAT 10 09/07/2020   Patient is on Vitamin D supplement, reduced from 6000 IU to 4000 IU Lab Results  Component Value Date   VD25OH 104 (H) 09/07/2021     She takes B12 supplement  Lab Results  Component Value Date   VITAMINB12 1,031 09/07/2021   Has been on slow release iron daily for many years, 29 fe.  Lab Results  Component Value Date   IRON 56 09/07/2021   TIBC 384 09/07/2021   FERRITIN 35 09/07/2021      Medication Review Current Outpatient Medications on File Prior to Visit  Medication Sig   aspirin 81 MG chewable tablet Chew 81 mg by mouth every other day.   BAYER MICROLET LANCETS lancets Use as instructed   bisoprolol-hydrochlorothiazide (ZIAC) 5-6.25 MG tablet TAKE 1 TABLET BY  MOUTH EVERY DAY FOR BLOOD PRESSURE   Blood Glucose Monitoring Suppl (CONTOUR NEXT MONITOR) w/Device KIT USE TO CHECK BLOOD SUGAR DAILY   cetirizine (ZYRTEC) 10 MG tablet Take 10 mg by mouth daily.   Cholecalciferol (VITAMIN D) 50 MCG (2000 UT) CAPS Take 2 capsules (4,000 Units total) by mouth daily.   ferrous sulfate dried (SLOW FE) 160 (50 FE) MG TBCR SR tablet Take 160 mg by mouth daily.   glucose blood (ONETOUCH ULTRA) test strip CHECK BLOOD SUGAR DAILY   hydrochlorothiazide (HYDRODIURIL) 25 MG tablet TAKE 1 TABLET DAILY FOR BLOOD PRESSURE & FLUID RETENTION (ANKLE SWELLING)   Magnesium 500 MG CAPS Take 500 mg by mouth 3 (three)  times daily.    meclizine (ANTIVERT) 25 MG tablet Take 1 tablet 3 x / day as needed for Dizziness   melatonin 3 MG TABS tablet Take 3 mg by mouth at bedtime.   metFORMIN (GLUCOPHAGE-XR) 500 MG 24 hr tablet TAKE 2 TABLETS 2 TIMES A DAY WITH MEALS FOR DIABETES   olmesartan (BENICAR) 40 MG tablet Take  1 tablet  Daily  for BP               /TAKE ONE TABLET BY MOUTH DAILY FOR BLOOD PRESSURE   ondansetron (ZOFRAN) 8 MG tablet Take 8 mg by mouth every 8 (eight) hours as needed.   potassium chloride (KLOR-CON M) 10 MEQ tablet Take 1 tab daily for low potassium.   promethazine (PHENERGAN) 25 MG tablet Take 1 tablet every 4 hours if needed for nausea or dizziness   rosuvastatin (CRESTOR) 10 MG tablet Take  1 tablet  Daily  for Cholesterol                          /      TAKE 1 TABLET BY MOUTH EVERY DAY FOR CHOLESTEROL   vitamin B-12 (CYANOCOBALAMIN) 1000 MCG tablet Take 1,000 mcg by mouth daily.   No current facility-administered medications on file prior to visit.    Allergies: Allergies  Allergen Reactions   Penicillins Other (See Comments)   Lisinopril Cough   Sulfamethoxazole-Trimethoprim Nausea Only   Topamax [Topiramate] Other (See Comments)    Affected mood, lathargic    Current Problems (verified) has Hyperlipidemia associated with type 2 diabetes mellitus ([place]); Hypertension; Vitamin D deficiency; Meniere's disease; Medication management; Overweight (BMI 25.0-29.9); Type 2 diabetes mellitus with sensory neuropathy ([place]); Fatty liver; CKD stage 2 due to type 1 diabetes mellitus ([place]); Environmental allergies; Situational depression; Neuropathy due to type 2 diabetes mellitus ([place]); Ophthalmic migraine; Hypokalemia; Arthritis of hand; Bilateral carpal tunnel syndrome; and Pain in right hand on their problem list.   Patient Care Team: [name], MD as PCP - General (Internal Medicine) Inda Castle, MD (Inactive) as Consulting Physician (Gastroenterology) Rozetta Nunnery,  MD (Inactive) as Consulting Physician (Otolaryngology) Rona Ravens, Dasher (Optometry) Justice Britain, MD as Consulting Physician (Orthopedic Surgery) Garrel Ridgel, DPM as Consulting Physician (Podiatry)  Surgical: She  has a past surgical history that includes ORIF ankle fracture (Left, 2000); Abdominal hysterectomy (1998); and Shoulder surgery (Right, 02/2017). Family Her family history includes Alcoholism in her mother; Autism in her brother; Breast cancer (age of onset: 55) in her paternal aunt; COPD in her brother; Diabetes in her father; Esophageal cancer (age of onset: 66) in her brother; Heart disease in her father and son; Hypertension in her father; Kidney cancer (age of onset: 26) in her sister; Kidney disease  in her son; Liver cancer in her maternal grandmother; Lung cancer in her brother; Pancreatic cancer in her mother. Social history  She reports that she has never smoked. She has never used smokeless tobacco. She reports current alcohol use. She reports that she does not use drugs.  Review of Systems  Constitutional: Negative.  Negative for malaise/fatigue and weight loss.  HENT: Negative.  Negative for hearing loss and tinnitus (intermittent, bil, high pitched, nonpulsatile).   Eyes: Negative.  Negative for blurred vision and double vision.  Respiratory: Negative.  Negative for cough, sputum production, shortness of breath and wheezing.   Cardiovascular: Negative.  Negative for chest pain, palpitations, orthopnea, claudication, leg swelling and PND.  Gastrointestinal: Negative.  Negative for abdominal pain, blood in stool, constipation, diarrhea, heartburn, melena, nausea and vomiting.  Genitourinary: Negative.   Musculoskeletal: Negative.  Negative for falls, joint pain and myalgias.  Skin: Negative.  Negative for rash.  Neurological:  Positive for tingling (bil toes, stable). Negative for dizziness (intermittent with menier's, stable/improved), sensory change, weakness and  headaches.  Endo/Heme/Allergies: Negative.  Negative for polydipsia.  Psychiatric/Behavioral: Negative.  Negative for depression, memory loss, substance abuse and suicidal ideas. The patient is not nervous/anxious and does not have insomnia.   All other systems reviewed and are negative.   Objective:   Today's Vitals   05/04/22 1014  BP: 132/72  Pulse: 90  Resp: 17  Temp: 97.9 F (36.6 C)  SpO2: 98%  Weight: 136 lb 3.2 oz (61.8 kg)  Height: _0  (1.499 m)   Body mass index is 27.51 kg/m.  General appearance: alert, no distress, WD/WN,  female HEENT: normocephalic, sclerae anicteric, TMs pearly, nares patent, no discharge or erythema, pharynx normal Oral cavity: MMM, no lesions Neck: supple, no lymphadenopathy, no thyromegaly, no masses Heart: Irregular, normal S1, S2, no murmurs Lungs: CTA bilaterally, no wheezes, rhonchi, or rales Abdomen: +bs, soft, non tender, non distended, no masses, no hepatomegaly, no splenomegaly Musculoskeletal: nontender, no swelling, no obvious deformity Extremities: no edema, no cyanosis, no clubbing Pulses: 2+ irregular, upper and lower extremities, normal cap refill Neurological: alert, oriented x 3, CN2-12 intact, strength normal upper extremities and lower extremities, sensation mildly dulled to monofilament in toes, otherwise intact throughout, DTRs 2+ throughout, no cerebellar signs, gait normal Psychiatric: normal affect, behavior normal, pleasant   EKG - Sinus Arrhythmia    Darrol Jump, NP   05/04/2022

## 2022-05-05 LAB — COMPLETE METABOLIC PANEL WITH GFR
AG Ratio: 1.7 (calc) (ref 1.0–2.5)
ALT: 9 U/L (ref 6–29)
AST: 14 U/L (ref 10–35)
Albumin: 4.5 g/dL (ref 3.6–5.1)
Alkaline phosphatase (APISO): 70 U/L (ref 37–153)
BUN: 13 mg/dL (ref 7–25)
CO2: 27 mmol/L (ref 20–32)
Calcium: 9.8 mg/dL (ref 8.6–10.4)
Chloride: 100 mmol/L (ref 98–110)
Creat: 0.67 mg/dL (ref 0.60–1.00)
Globulin: 2.7 g/dL (calc) (ref 1.9–3.7)
Glucose, Bld: 108 mg/dL — ABNORMAL HIGH (ref 65–99)
Potassium: 3.8 mmol/L (ref 3.5–5.3)
Sodium: 137 mmol/L (ref 135–146)
Total Bilirubin: 0.5 mg/dL (ref 0.2–1.2)
Total Protein: 7.2 g/dL (ref 6.1–8.1)
eGFR: 93 mL/min/{1.73_m2} (ref >=60)

## 2022-05-05 LAB — CBC WITH DIFFERENTIAL/PLATELET
Absolute Monocytes: 557 cells/uL (ref 200–950)
Basophils Absolute: 52 cells/uL (ref 0–200)
Basophils Relative: 0.6 %
Eosinophils Absolute: 174 cells/uL (ref 15–500)
Eosinophils Relative: 2 %
HCT: 37 % (ref 35.0–45.0)
Hemoglobin: 12.1 g/dL (ref 11.7–15.5)
Lymphs Abs: 2401 cells/uL (ref 850–3900)
MCH: 27.4 pg (ref 27.0–33.0)
MCHC: 32.7 g/dL (ref 32.0–36.0)
MCV: 83.9 fL (ref 80.0–100.0)
MPV: 10.5 fL (ref 7.5–12.5)
Monocytes Relative: 6.4 %
Neutro Abs: 5516 cells/uL (ref 1500–7800)
Neutrophils Relative %: 63.4 %
Platelets: 258 10*3/uL (ref 140–400)
RBC: 4.41 10*6/uL (ref 3.80–5.10)
RDW: 13.6 % (ref 11.0–15.0)
Total Lymphocyte: 27.6 %
WBC: 8.7 10*3/uL (ref 3.8–10.8)

## 2022-05-05 LAB — HEMOGLOBIN A1C
Hgb A1c MFr Bld: 5.8 % of total Hgb — ABNORMAL HIGH (ref <5.7)
Mean Plasma Glucose: 120 mg/dL
eAG (mmol/L): 6.6 mmol/L

## 2022-05-05 LAB — LIPID PANEL
Cholesterol: 139 mg/dL (ref <200)
HDL: 49 mg/dL — ABNORMAL LOW (ref >=50)
LDL Cholesterol (Calc): 70 mg/dL (calc)
Non-HDL Cholesterol (Calc): 90 mg/dL (calc) (ref <130)
Total CHOL/HDL Ratio: 2.8 (calc) (ref <5.0)
Triglycerides: 118 mg/dL (ref <150)

## 2022-05-05 LAB — MAGNESIUM: Magnesium: 1.5 mg/dL (ref 1.5–2.5)

## 2022-05-05 LAB — TSH: TSH: 1.78 mIU/L (ref 0.40–4.50)

## 2022-05-11 ENCOUNTER — Encounter: Payer: Self-pay | Admitting: Nurse Practitioner

## 2022-05-19 ENCOUNTER — Other Ambulatory Visit: Payer: Self-pay | Admitting: Nurse Practitioner

## 2022-05-19 DIAGNOSIS — E1140 Type 2 diabetes mellitus with diabetic neuropathy, unspecified: Secondary | ICD-10-CM

## 2022-06-17 ENCOUNTER — Other Ambulatory Visit: Payer: Self-pay | Admitting: Internal Medicine

## 2022-06-23 ENCOUNTER — Ambulatory Visit: Payer: Medicare HMO | Admitting: Podiatry

## 2022-06-23 DIAGNOSIS — D2371 Other benign neoplasm of skin of right lower limb, including hip: Secondary | ICD-10-CM | POA: Diagnosis not present

## 2022-06-23 DIAGNOSIS — M79675 Pain in left toe(s): Secondary | ICD-10-CM | POA: Diagnosis not present

## 2022-06-23 DIAGNOSIS — B351 Tinea unguium: Secondary | ICD-10-CM

## 2022-06-23 DIAGNOSIS — M79674 Pain in right toe(s): Secondary | ICD-10-CM

## 2022-06-23 NOTE — Progress Notes (Signed)
She presents today chief complaint of painfully elongated toenails and benign skin lesions bilaterally.  Objective: Vital signs stable alert oriented x3 pulses remain palpable.  Toenails are long thick yellow dystrophic onychomycotic sharp incurvated painful palpation as well as debridement.  Also painful calluses bilateral.  Assessment: Pain in limb secondary to onychomycosis.  Pain in limb secondary to benign skin lesions.  Plan: Discussed etiology pathology and surgical therapies debrided all benign skin lesion to bleed debrided toenails 1 through 5 bilateral.

## 2022-06-28 ENCOUNTER — Other Ambulatory Visit: Payer: Self-pay

## 2022-06-28 DIAGNOSIS — R42 Dizziness and giddiness: Secondary | ICD-10-CM

## 2022-06-28 MED ORDER — PROMETHAZINE HCL 25 MG PO TABS: ORAL_TABLET | ORAL | 2 refills | Status: DC

## 2022-06-30 ENCOUNTER — Other Ambulatory Visit: Payer: Self-pay

## 2022-06-30 DIAGNOSIS — R42 Dizziness and giddiness: Secondary | ICD-10-CM

## 2022-06-30 MED ORDER — MECLIZINE HCL 25 MG PO TABS: ORAL_TABLET | ORAL | 2 refills | Status: AC

## 2022-07-05 DIAGNOSIS — G5602 Carpal tunnel syndrome, left upper limb: Secondary | ICD-10-CM | POA: Diagnosis not present

## 2022-07-19 DIAGNOSIS — M25642 Stiffness of left hand, not elsewhere classified: Secondary | ICD-10-CM | POA: Diagnosis not present

## 2022-08-01 ENCOUNTER — Other Ambulatory Visit: Payer: Self-pay

## 2022-08-01 DIAGNOSIS — I10 Essential (primary) hypertension: Secondary | ICD-10-CM

## 2022-08-01 MED ORDER — OLMESARTAN MEDOXOMIL 40 MG PO TABS: ORAL_TABLET | ORAL | 3 refills | Status: DC

## 2022-08-10 ENCOUNTER — Other Ambulatory Visit: Payer: Self-pay | Admitting: Nurse Practitioner

## 2022-08-10 DIAGNOSIS — E1140 Type 2 diabetes mellitus with diabetic neuropathy, unspecified: Secondary | ICD-10-CM

## 2022-08-31 ENCOUNTER — Encounter: Payer: Self-pay | Admitting: Internal Medicine

## 2022-09-05 ENCOUNTER — Encounter: Payer: Self-pay | Admitting: Internal Medicine

## 2022-09-07 ENCOUNTER — Ambulatory Visit (INDEPENDENT_AMBULATORY_CARE_PROVIDER_SITE_OTHER): Payer: Medicare HMO | Admitting: Nurse Practitioner

## 2022-09-07 ENCOUNTER — Encounter: Payer: Self-pay | Admitting: Nurse Practitioner

## 2022-09-07 VITALS — BP 110/66 | HR 82 | Temp 96.9°F | Ht 59.5 in | Wt 132.6 lb

## 2022-09-07 DIAGNOSIS — E1140 Type 2 diabetes mellitus with diabetic neuropathy, unspecified: Secondary | ICD-10-CM

## 2022-09-07 DIAGNOSIS — E663 Overweight: Secondary | ICD-10-CM

## 2022-09-07 DIAGNOSIS — E1169 Type 2 diabetes mellitus with other specified complication: Secondary | ICD-10-CM | POA: Diagnosis not present

## 2022-09-07 DIAGNOSIS — Z0001 Encounter for general adult medical examination with abnormal findings: Secondary | ICD-10-CM

## 2022-09-07 DIAGNOSIS — Z79899 Other long term (current) drug therapy: Secondary | ICD-10-CM

## 2022-09-07 DIAGNOSIS — Z1329 Encounter for screening for other suspected endocrine disorder: Secondary | ICD-10-CM

## 2022-09-07 DIAGNOSIS — Z1211 Encounter for screening for malignant neoplasm of colon: Secondary | ICD-10-CM

## 2022-09-07 DIAGNOSIS — Z0000 Encounter for general adult medical examination without abnormal findings: Secondary | ICD-10-CM | POA: Diagnosis not present

## 2022-09-07 DIAGNOSIS — Z9109 Other allergy status, other than to drugs and biological substances: Secondary | ICD-10-CM

## 2022-09-07 DIAGNOSIS — E1022 Type 1 diabetes mellitus with diabetic chronic kidney disease: Secondary | ICD-10-CM

## 2022-09-07 DIAGNOSIS — E559 Vitamin D deficiency, unspecified: Secondary | ICD-10-CM | POA: Diagnosis not present

## 2022-09-07 DIAGNOSIS — N182 Chronic kidney disease, stage 2 (mild): Secondary | ICD-10-CM

## 2022-09-07 DIAGNOSIS — I10 Essential (primary) hypertension: Secondary | ICD-10-CM | POA: Diagnosis not present

## 2022-09-07 DIAGNOSIS — H8109 Meniere's disease, unspecified ear: Secondary | ICD-10-CM

## 2022-09-07 DIAGNOSIS — F4321 Adjustment disorder with depressed mood: Secondary | ICD-10-CM

## 2022-09-07 DIAGNOSIS — Z13820 Encounter for screening for osteoporosis: Secondary | ICD-10-CM

## 2022-09-07 DIAGNOSIS — Z1389 Encounter for screening for other disorder: Secondary | ICD-10-CM | POA: Diagnosis not present

## 2022-09-07 DIAGNOSIS — E785 Hyperlipidemia, unspecified: Secondary | ICD-10-CM | POA: Diagnosis not present

## 2022-09-07 DIAGNOSIS — G5603 Carpal tunnel syndrome, bilateral upper limbs: Secondary | ICD-10-CM

## 2022-09-07 DIAGNOSIS — Z136 Encounter for screening for cardiovascular disorders: Secondary | ICD-10-CM

## 2022-09-07 DIAGNOSIS — K760 Fatty (change of) liver, not elsewhere classified: Secondary | ICD-10-CM

## 2022-09-07 DIAGNOSIS — G43109 Migraine with aura, not intractable, without status migrainosus: Secondary | ICD-10-CM

## 2022-09-07 NOTE — Patient Instructions (Signed)

## 2022-09-07 NOTE — Progress Notes (Signed)
CPE  Assessment:   Annual Physical Due annually  Health maintenance reviewed  Essential hypertension Discussed DASH (Dietary Approaches to Stop Hypertension) DASH diet is lower in sodium than a typical American diet. Cut back on foods that are high in saturated fat, cholesterol, and trans fats. Eat more whole-grain foods, fish, poultry, and nuts Remain active and exercise as tolerated daily.  Monitor BP at home-Call if greater than 130/80.  Check CMP/CBC   Type 2 diabetes mellitus with sensory neuropathy [facility]) Education: Reviewed 'ABCs' of diabetes management  Discussed goals to be met and/or maintained include A1C (<7) Blood pressure (<130/80) Cholesterol (LDL <70) Continue Eye Exam yearly  Continue Dental Exam Q6 mo Discussed dietary recommendations Discussed Physical Activity recommendations Foot exam UTD today with Podiatry, Dr. Milinda Pointer. Plans to f/u next week.  Check A1C   Hyperlipidemia associated with T2DM (HCC) Discussed lifestyle modifications. Recommended diet heavy in fruits and veggies, omega 3's. Decrease consumption of animal meats, cheeses, and dairy products. Remain active and exercise as tolerated. Continue to monitor. Check lipids/TSH   CKD 2 associated with T2DM ([place]) Discussed how what you eat and drink can aide in kidney protection. Stay well hydrated. Avoid high salt foods. Avoid NSAIDS. Keep BP and BG well controlled.   Take medications as prescribed. Remain active and exercise as tolerated daily. Maintain weight.  Continue to monitor. Check CMP/GFR/Microablumin   Neuropathy associated with T2DM (HCC) Control sugars, check feet daily.  Podiatry follows   Vitamin D deficiency Continue supplement Monitor levels   Medication management All medications discussed and reviewed in full. All questions and concerns regarding medications addressed.     Meniere's disease, unspecified laterality Continue HCTZ, monitor kidney  function Established with ENT in [place] Continue to monitor   Overweight with comorbidities- BMI 28 Discussed appropriate BMI Goal of losing 1 lb per month. Diet modification. Physical activity. Encouraged/praised to build confidence.   Fatty liver Weight loss advised, avoid alcohol/tylenol Monitor LFTs  Environmental allegies Continue OTC antihistamine, Singulair Avoid triggers  Situational depression Declines meds, managing well with lifestyle  Reviewed relaxation techniques.  Sleep hygiene. Encouraged personality growth wand development through coping techniques and problem-solving skills. Limit/Decrease/Monitor drug/alcohol intake.     Ophthalmic migraine Occasional, stress related; doing well with conservative therapy Rest/ibuprofen Stay well hydrated  Carpel Tunnel S/p surger right hand 10/2021 and left 06/2022 Following with Dr Luther Parody  Screening for cardiovascular disease EKG  Screening for thyroid disorder Check and monitor TSH  Screening for proteinuria or hematuria Check and monitor UA/Microalbumin  Screening for colon cancer Cologuard ordered  Screening for osteoporosis DEXA ordered  No orders of the defined types were placed in this encounter.    Notify office for further evaluation and treatment, questions or concerns if any reported s/s fail to improve.   The patient was advised to call back or seek an in-person evaluation if any symptoms worsen or if the condition fails to improve as anticipated.   Further disposition pending results of labs. Discussed med's effects and SE's.    I discussed the assessment and treatment plan with the patient. The patient was provided an opportunity to ask questions and all were answered. The patient agreed with the plan and demonstrated an understanding of the instructions.  Discussed med's effects and SE's. Screening labs and tests as requested with regular follow-up as recommended.  I provided 35  minutes of face-to-face time during this encounter including counseling, chart review, and critical decision making was preformed.   Future Appointments  Date Time Provider [place]  09/15/2022 10:15 AM [place], Bennie Pierini T, [place] [facility]  01/12/2023 10:00 AM Darrol Jump, NP [facility] None  09/08/2023 10:00 AM Darrol Jump, NP [facility] None    Plan:   During the course of the visit the patient was educated and counseled about appropriate screening and preventive services including:   Pneumococcal vaccine  Influenza vaccine Td vaccine Prevnar 13 Screening electrocardiogram Screening mammography Bone densitometry screening Colorectal cancer screening Diabetes screening Glaucoma screening Nutrition counseling  Advanced directives: given info/requested copies   Subjective:   Regina Cervantes is a 71 y.o. female who presents for a CPE and follow up. She has Hyperlipidemia associated with type 2 diabetes mellitus ([place]); Hypertension; Vitamin D deficiency; Meniere's disease; Medication management; Overweight (BMI 25.0-29.9); Type 2 diabetes mellitus with sensory neuropathy ([place]); Fatty liver; CKD stage 2 due to type 1 diabetes mellitus ([place]); Environmental allergies; Situational depression; Neuropathy due to type 2 diabetes mellitus ([place]); Ophthalmic migraine; Hypokalemia; Arthritis of hand; Bilateral carpal tunnel syndrome; and Pain in right hand on their problem list.  Overall she reports feeling well today.  She has no new or additional concerns at this time.   She is married, 3 sons, youngest passed at 52 with ruptured pancreatic cyst, 3 grandchildren.  She reports a lot of stress taking care of her family members who are aging.  Sister passed due to metastatic kidney cancer in 2021. Brother was diagnosed with esophageal cancer but doing well with chemo/radiation.  Feels she is managing mood fairly, has seen improvement this year, she is taking melatonin 3 mg  for sleep which is is working well. Has had some occasional ophthalmic migraines improved, Rest and ibuprofen helps.   She has seen new ENT in [place], Dr. Lucia Gaskins has retired.  Follows for her meniere's, she is back on her HCTZ.  Denies any recent flare and is well controlled.  She has carpal tunnel bothering her more, has appointment to see Dr. Amedeo Plenty later this month. Wearing bil braces without improvement.   BMI is Body mass index is 26.33 kg/m., she is working on diet and exercise.  Has reduced intake of meats, sodas.  Wt Readings from Last 3 Encounters:  09/07/22 132 lb 9.6 oz (60.1 kg)  05/04/22 136 lb 3.2 oz (61.8 kg)  01/11/22 141 lb (64 kg)   Her blood pressure has been controlled at home, today their BP is BP: 110/66  She does workout, generally active. She denies chest pain, shortness of breath, dizziness.    She is on cholesterol medication (rosuvastatain 10 mg daily) and denies myalgias. Her cholesterol is at goal. The cholesterol last visit was:   Lab Results  Component Value Date   CHOL 139 05/04/2022   HDL 49 (L) 05/04/2022   LDLCALC 70 05/04/2022   TRIG 118 05/04/2022   CHOLHDL 2.8 05/04/2022    She was first diagnosed with DM 10-15 years ago, has been working on diet and exercise for diabetes with DM neuropathy (tingling, manages with topicals, podiatry Dr. Milinda Pointer following) and CKD II on ACEi, she has done a great job bringing down her A1C with weight loss/phentermine, she is on metformin, bASA, and denies polydipsia, polyuria and visual disturbances. Taking metformin 500 mg 3 tabs/day due to GI sx. She does check fasting sugars occasionally, ranging 90-110 or so. Last A1C in the office was:  Lab Results  Component Value Date   HGBA1C 5.8 (H) 05/04/2022    CKD II associated with T2DM, monitored  at this office, olmesartan. Last GFR:  Lab Results  Component Value Date   EGFR 93 05/04/2022   Lab Results  Component Value Date   MICRALBCREAT 8 01/11/2022    MICRALBCREAT 39 (H) 09/07/2021   MICRALBCREAT 10 09/07/2020   Patient is on Vitamin D supplement, reduced from 6000 IU to 4000 IU Lab Results  Component Value Date   VD25OH 104 (H) 09/07/2021     She takes B12 supplement  Lab Results  Component Value Date   VITAMINB12 1,031 09/07/2021   Has been on slow release iron daily for many years, 70 fe.  Lab Results  Component Value Date   IRON 56 09/07/2021   TIBC 384 09/07/2021   FERRITIN 35 09/07/2021      Medication Review Current Outpatient Medications on File Prior to Visit  Medication Sig   aspirin 81 MG chewable tablet Chew 81 mg by mouth every other day.   BAYER MICROLET LANCETS lancets Use as instructed   bisoprolol-hydrochlorothiazide (ZIAC) 5-6.25 MG tablet TAKE 1 TABLET BY MOUTH EVERY DAY FOR BLOOD PRESSURE   Blood Glucose Monitoring Suppl (CONTOUR NEXT MONITOR) w/Device KIT USE TO CHECK BLOOD SUGAR DAILY   cetirizine (ZYRTEC) 10 MG tablet Take 10 mg by mouth daily.   Cholecalciferol (VITAMIN D) 50 MCG (2000 UT) CAPS Take 2 capsules (4,000 Units total) by mouth daily.   ferrous sulfate dried (SLOW FE) 160 (50 FE) MG TBCR SR tablet Take 160 mg by mouth daily.   gabapentin (NEURONTIN) 100 MG capsule TAKE 1 CAPSULE (100 MG TOTAL) BY MOUTH THREE TIMES DAILY.   glucose blood (ONETOUCH ULTRA) test strip CHECK BLOOD SUGAR DAILY   hydrochlorothiazide (HYDRODIURIL) 25 MG tablet TAKE 1 TABLET DAILY FOR BLOOD PRESSURE & FLUID RETENTION (ANKLE SWELLING)   meclizine (ANTIVERT) 25 MG tablet Take 1 tablet 3 x / day as needed for Dizziness   melatonin 3 MG TABS tablet Take 3 mg by mouth at bedtime.   metFORMIN (GLUCOPHAGE-XR) 500 MG 24 hr tablet TAKE 2 TABLETS 2 TIMES A DAY WITH MEALS FOR DIABETES   olmesartan (BENICAR) 40 MG tablet Take  1 tablet  Daily  for BP               /TAKE ONE TABLET BY MOUTH DAILY FOR BLOOD PRESSURE   potassium chloride (KLOR-CON M) 10 MEQ tablet Take 1 tab daily for low potassium.   promethazine (PHENERGAN) 25  MG tablet Take 1 tablet every 4 hours if needed for nausea or dizziness   rosuvastatin (CRESTOR) 10 MG tablet TAKE 1 TABLET BY MOUTH EVERY DAY FOR CHOLESTEROL   vitamin B-12 (CYANOCOBALAMIN) 1000 MCG tablet Take 1,000 mcg by mouth daily.   Magnesium 500 MG CAPS Take 500 mg by mouth 3 (three) times daily.  (Patient not taking: Reported on 09/07/2022)   ondansetron (ZOFRAN) 8 MG tablet Take 8 mg by mouth every 8 (eight) hours as needed. (Patient not taking: Reported on 09/07/2022)   No current facility-administered medications on file prior to visit.    Allergies: Allergies  Allergen Reactions   Penicillins Other (See Comments)   Lisinopril Cough   Sulfamethoxazole-Trimethoprim Nausea Only   Topamax [Topiramate] Other (See Comments)    Affected mood, lathargic    Current Problems (verified) has Hyperlipidemia associated with type 2 diabetes mellitus ([place]); Hypertension; Vitamin D deficiency; Meniere's disease; Medication management; Overweight (BMI 25.0-29.9); Type 2 diabetes mellitus with sensory neuropathy ([place]); Fatty liver; CKD stage 2 due to type 1 diabetes mellitus ([place]); Environmental  allergies; Situational depression; Neuropathy due to type 2 diabetes mellitus ([place]); Ophthalmic migraine; Hypokalemia; Arthritis of hand; Bilateral carpal tunnel syndrome; and Pain in right hand on their problem list.  Screening Tests Immunization History  Administered Date(s) Administered   Fluad Quad(high Dose 65+) 08/27/2021   Influenza Split 07/16/2013, 07/24/2014   Influenza, High Dose Seasonal PF 06/15/2015, 07/04/2016, 07/14/2017, 06/19/2018, 06/03/2019, 09/14/2020   Influenza-Unspecified 07/07/2017, 06/06/2022   PFIZER Comirnaty(Gray Top)Covid-19 Tri-Sucrose Vaccine 03/26/2021   PFIZER(Purple Top)SARS-COV-2 Vaccination 11/09/2019, 12/03/2019, 08/21/2020   PPD Test 04/14/2014   Pfizer Covid-19 Vaccine Bivalent Booster 31yr & up 08/27/2021   Pneumococcal Conjugate-13 10/31/2014    Pneumococcal Polysaccharide-23 12/01/2004, 03/23/2016   Tdap 04/06/2011   Zoster Recombinat (Shingrix) 06/06/2022, 08/07/2022   Zoster, Live 07/16/2013   Health Maintenance  Topic Date Due   Medicare Annual Wellness (AWV)  Never done   OPHTHALMOLOGY EXAM  02/18/2021   DTaP/Tdap/Td (2 - Td or Tdap) 04/05/2021   COVID-19 Vaccine (6 - 2023-24 season) 05/20/2022   Fecal DNA (Cologuard)  06/28/2022   FOOT EXAM  09/07/2022   HEMOGLOBIN A1C  11/04/2022   Diabetic kidney evaluation - Urine ACR  01/12/2023   MAMMOGRAM  02/08/2023   Diabetic kidney evaluation - eGFR measurement  05/05/2023   Pneumonia Vaccine 71 Years old  Completed   INFLUENZA VACCINE  Completed   DEXA SCAN  Completed   Hepatitis C Screening  Completed   Zoster Vaccines- Shingrix  Completed   HPV VACCINES  Aged Out   COLONOSCOPY (Pts 45-446yrInsurance coverage will need to be confirmed)  Discontinued   Last colonoscopy: 2010 - complete Last cologuard: 06/2019 negative Due  Last mammogram: 01/2022 - Negative Last pap smear/pelvic exam: remote, hysterectomy, DONE DEXA: 2019 - normal  Due  TD or Tdap: 2012, wants to boost PRN only  Shingles/Zostavax: 2014, will get shingrix at pharmacy  Covid 19: 2/2, boosted 08/2021.  Flu:  Completed 06/2022  Names of Other Physician/Practitioners you currently use: 1. [facility]- here for primary care 2. [facility], Dr. AbWillette Braceeye doctor, last visit 04/2021, report requested, will be getting new vision provider this year 3. Dr. HaBjorn Pippindentist, last visit 2022, goes [id number] for a new  Dr. HyaMilinda Pointer podiatry, neuropathy   Patient Care Team: McKUnk PintoD as PCP - General (Internal Medicine) KapInda CastleD (Inactive) as Consulting Physician (Gastroenterology) NewRozetta NunneryD (Inactive) as Consulting Physician (Otolaryngology) AblRona RavensD [facility]) SupJustice BritainD as Consulting Physician  (Orthopedic Surgery) HyaGarrel RidgelPM as Consulting Physician (Podiatry)  Surgical: She  has a past surgical history that includes ORIF ankle fracture (Left, 2000); Abdominal hysterectomy (1998); and Shoulder surgery (Right, 02/2017). Family Her family history includes Alcoholism in her mother; Autism in her brother; Breast cancer (age of onset: 80)2n her paternal aunt; COPD in her brother; Diabetes in her father; Esophageal cancer (age of onset: 68)41n her brother; Heart disease in her father and son; Hypertension in her father; Kidney cancer (age of onset: 58)46n her sister; Kidney disease in her son; Liver cancer in her maternal grandmother; Lung cancer in her brother; Pancreatic cancer in her mother. Social history  She reports that she has never smoked. She has never used smokeless tobacco. She reports current alcohol use. She reports that she does not use drugs.  Review of Systems  Constitutional: Negative.  Negative for malaise/fatigue and weight loss.  HENT: Negative.  Negative for hearing loss  and tinnitus (intermittent, bil, high pitched, nonpulsatile).   Eyes: Negative.  Negative for blurred vision and double vision.  Respiratory: Negative.  Negative for cough, sputum production, shortness of breath and wheezing.   Cardiovascular: Negative.  Negative for chest pain, palpitations, orthopnea, claudication, leg swelling and PND.  Gastrointestinal: Negative.  Negative for abdominal pain, blood in stool, constipation, diarrhea, heartburn, melena, nausea and vomiting.  Genitourinary: Negative.   Musculoskeletal: Negative.  Negative for falls, joint pain and myalgias.  Skin: Negative.  Negative for rash.  Neurological:  Positive for tingling (bil toes, stable). Negative for dizziness (intermittent with menier's, stable/improved), sensory change, weakness and headaches.  Endo/Heme/Allergies: Negative.  Negative for polydipsia.  Psychiatric/Behavioral: Negative.  Negative for depression,  memory loss, substance abuse and suicidal ideas. The patient is not nervous/anxious and does not have insomnia.   All other systems reviewed and are negative.   Objective:   Today's Vitals   09/07/22 0956  BP: 110/66  Pulse: 82  Temp: (!) 96.9 F (36.1 C)  SpO2: 99%  Weight: 132 lb 9.6 oz (60.1 kg)  Height: 4' 11.5" (1.511 m)   Body mass index is 26.33 kg/m.  General appearance: alert, no distress, WD/WN,  female HEENT: normocephalic, sclerae anicteric, TMs pearly, nares patent, no discharge or erythema, pharynx normal Oral cavity: MMM, no lesions Neck: supple, no lymphadenopathy, no thyromegaly, no masses Heart: RRR, normal S1, S2, no murmurs Lungs: CTA bilaterally, no wheezes, rhonchi, or rales Abdomen: +bs, soft, non tender, non distended, no masses, no hepatomegaly, no splenomegaly Musculoskeletal: nontender, no swelling, no obvious deformity Extremities: no edema, no cyanosis, no clubbing Pulses: 2+ symmetric, upper and lower extremities, normal cap refill Neurological: alert, oriented x 3, CN2-12 intact, strength normal upper extremities and lower extremities, sensation mildly dulled to monofilament in toes, otherwise intact throughout, DTRs 2+ throughout, no cerebellar signs, gait normal Psychiatric: normal affect, behavior normal, pleasant   EKG:  NSR with PAC/PVC  Darrol Jump, NP   09/07/2022

## 2022-09-08 LAB — URINALYSIS, ROUTINE W REFLEX MICROSCOPIC
Bacteria, UA: NONE SEEN /HPF
Bilirubin Urine: NEGATIVE
Glucose, UA: NEGATIVE
Hgb urine dipstick: NEGATIVE
Hyaline Cast: NONE SEEN /LPF
Ketones, ur: NEGATIVE
Nitrite: NEGATIVE
Protein, ur: NEGATIVE
RBC / HPF: NONE SEEN /HPF (ref 0–2)
Specific Gravity, Urine: 1.013 (ref 1.001–1.035)
pH: 5.5 (ref 5.0–8.0)

## 2022-09-08 LAB — CBC WITH DIFFERENTIAL/PLATELET
Absolute Monocytes: 558 cells/uL (ref 200–950)
Basophils Absolute: 37 cells/uL (ref 0–200)
Basophils Relative: 0.4 %
Eosinophils Absolute: 288 cells/uL (ref 15–500)
Eosinophils Relative: 3.1 %
HCT: 36.4 % (ref 35.0–45.0)
Hemoglobin: 12.3 g/dL (ref 11.7–15.5)
Lymphs Abs: 2669 cells/uL (ref 850–3900)
MCH: 27.6 pg (ref 27.0–33.0)
MCHC: 33.8 g/dL (ref 32.0–36.0)
MCV: 81.8 fL (ref 80.0–100.0)
MPV: 10.8 fL (ref 7.5–12.5)
Monocytes Relative: 6 %
Neutro Abs: 5747 cells/uL (ref 1500–7800)
Neutrophils Relative %: 61.8 %
Platelets: 249 10*3/uL (ref 140–400)
RBC: 4.45 10*6/uL (ref 3.80–5.10)
RDW: 14.1 % (ref 11.0–15.0)
Total Lymphocyte: 28.7 %
WBC: 9.3 10*3/uL (ref 3.8–10.8)

## 2022-09-08 LAB — LIPID PANEL
Cholesterol: 143 mg/dL (ref <200)
HDL: 48 mg/dL — ABNORMAL LOW (ref >=50)
LDL Cholesterol (Calc): 74 mg/dL (calc)
Non-HDL Cholesterol (Calc): 95 mg/dL (calc) (ref <130)
Total CHOL/HDL Ratio: 3 (calc) (ref <5.0)
Triglycerides: 130 mg/dL (ref <150)

## 2022-09-08 LAB — COMPLETE METABOLIC PANEL WITH GFR
AG Ratio: 1.5 (calc) (ref 1.0–2.5)
ALT: 9 U/L (ref 6–29)
AST: 15 U/L (ref 10–35)
Albumin: 4.4 g/dL (ref 3.6–5.1)
Alkaline phosphatase (APISO): 61 U/L (ref 37–153)
BUN: 17 mg/dL (ref 7–25)
CO2: 26 mmol/L (ref 20–32)
Calcium: 10.2 mg/dL (ref 8.6–10.4)
Chloride: 101 mmol/L (ref 98–110)
Creat: 0.63 mg/dL (ref 0.60–1.00)
Globulin: 3 g/dL (calc) (ref 1.9–3.7)
Glucose, Bld: 114 mg/dL — ABNORMAL HIGH (ref 65–99)
Potassium: 3.8 mmol/L (ref 3.5–5.3)
Sodium: 138 mmol/L (ref 135–146)
Total Bilirubin: 0.6 mg/dL (ref 0.2–1.2)
Total Protein: 7.4 g/dL (ref 6.1–8.1)
eGFR: 94 mL/min/{1.73_m2} (ref >=60)

## 2022-09-08 LAB — VITAMIN D 25 HYDROXY (VIT D DEFICIENCY, FRACTURES): Vit D, 25-Hydroxy: 77 ng/mL (ref 30–100)

## 2022-09-08 LAB — TSH: TSH: 1.77 mIU/L (ref 0.40–4.50)

## 2022-09-08 LAB — MICROALBUMIN / CREATININE URINE RATIO
Creatinine, Urine: 69 mg/dL (ref 20–275)
Microalb Creat Ratio: 17 mcg/mg creat (ref <30)
Microalb, Ur: 1.2 mg/dL

## 2022-09-08 LAB — HEMOGLOBIN A1C
Hgb A1c MFr Bld: 5.8 % of total Hgb — ABNORMAL HIGH (ref <5.7)
Mean Plasma Glucose: 120 mg/dL
eAG (mmol/L): 6.6 mmol/L

## 2022-09-08 LAB — INSULIN, RANDOM: Insulin: 11.6 u[IU]/mL

## 2022-09-08 LAB — MICROSCOPIC MESSAGE

## 2022-09-08 LAB — MAGNESIUM: Magnesium: 1.6 mg/dL (ref 1.5–2.5)

## 2022-09-15 ENCOUNTER — Ambulatory Visit (INDEPENDENT_AMBULATORY_CARE_PROVIDER_SITE_OTHER): Payer: Medicare HMO | Admitting: Podiatry

## 2022-09-15 ENCOUNTER — Encounter: Payer: Self-pay | Admitting: Podiatry

## 2022-09-15 DIAGNOSIS — B351 Tinea unguium: Secondary | ICD-10-CM | POA: Diagnosis not present

## 2022-09-15 DIAGNOSIS — D2371 Other benign neoplasm of skin of right lower limb, including hip: Secondary | ICD-10-CM

## 2022-09-15 DIAGNOSIS — D2372 Other benign neoplasm of skin of left lower limb, including hip: Secondary | ICD-10-CM

## 2022-09-15 DIAGNOSIS — M79676 Pain in unspecified toe(s): Secondary | ICD-10-CM | POA: Diagnosis not present

## 2022-09-15 NOTE — Progress Notes (Signed)
She presents today chief complaint of painful elongated toenails and calluses.  Objective: Vital signs stable tellurian x 3 callus interphalangeal joint hallux right appears to be doing much better.  Otherwise the toenails are still thick yellow dystrophic onychomycotic particular on the right foot.  No open lesions or wounds are noted.  Assessment: Pain in limb secondary to nail dystrophy onychomycosis and painful lesions.  Plan: Debridement of painful lesions bilateral.  Debridement of nails 1 through 5 bilateral.

## 2022-09-28 DIAGNOSIS — Z1211 Encounter for screening for malignant neoplasm of colon: Secondary | ICD-10-CM | POA: Diagnosis not present

## 2022-10-08 LAB — COLOGUARD: COLOGUARD: NEGATIVE

## 2022-10-12 ENCOUNTER — Telehealth: Payer: Self-pay | Admitting: Nurse Practitioner

## 2022-10-12 DIAGNOSIS — E876 Hypokalemia: Secondary | ICD-10-CM

## 2022-10-12 MED ORDER — POTASSIUM CHLORIDE CRYS ER 10 MEQ PO TBCR: EXTENDED_RELEASE_TABLET | ORAL | 3 refills | Status: DC

## 2022-10-12 NOTE — Telephone Encounter (Signed)
Pt is wanting a refill on potassium medication

## 2022-10-12 NOTE — Addendum Note (Signed)
Addended by: Chancy Hurter on: 10/12/2022 04:06 PM   Modules accepted: Orders

## 2022-11-17 ENCOUNTER — Other Ambulatory Visit: Payer: Self-pay | Admitting: Nurse Practitioner

## 2022-11-17 DIAGNOSIS — E1140 Type 2 diabetes mellitus with diabetic neuropathy, unspecified: Secondary | ICD-10-CM

## 2022-12-15 ENCOUNTER — Other Ambulatory Visit: Payer: Self-pay | Admitting: Nurse Practitioner

## 2022-12-15 DIAGNOSIS — E1140 Type 2 diabetes mellitus with diabetic neuropathy, unspecified: Secondary | ICD-10-CM

## 2022-12-22 ENCOUNTER — Encounter: Payer: Self-pay | Admitting: Podiatry

## 2022-12-22 ENCOUNTER — Ambulatory Visit: Payer: Medicare HMO | Admitting: Podiatry

## 2022-12-22 DIAGNOSIS — D2371 Other benign neoplasm of skin of right lower limb, including hip: Secondary | ICD-10-CM

## 2022-12-22 DIAGNOSIS — D2372 Other benign neoplasm of skin of left lower limb, including hip: Secondary | ICD-10-CM | POA: Diagnosis not present

## 2022-12-22 DIAGNOSIS — M79676 Pain in unspecified toe(s): Secondary | ICD-10-CM | POA: Diagnosis not present

## 2022-12-22 DIAGNOSIS — B351 Tinea unguium: Secondary | ICD-10-CM

## 2022-12-23 NOTE — Progress Notes (Signed)
Presents today chief complaint of painful elongated toenails.  Objective:Marland Kitchen  Pulses are palpable.  Toenails are long thick yellow dystrophic onychomycotic multiple benign skin lesions plantar aspect of the bilateral foot.  Assessment: Benign skin lesions bilateral painful in nature.  Pain in limb secondary to onychomycosis.  Plan: Debridement of long thick painful mycotic nails as well as debrided benign skin lesions.

## 2023-01-05 ENCOUNTER — Other Ambulatory Visit: Payer: Self-pay | Admitting: Internal Medicine

## 2023-01-05 DIAGNOSIS — Z1231 Encounter for screening mammogram for malignant neoplasm of breast: Secondary | ICD-10-CM

## 2023-01-12 ENCOUNTER — Ambulatory Visit (INDEPENDENT_AMBULATORY_CARE_PROVIDER_SITE_OTHER): Payer: Medicare HMO | Admitting: Nurse Practitioner

## 2023-01-12 ENCOUNTER — Encounter: Payer: Self-pay | Admitting: Nurse Practitioner

## 2023-01-12 VITALS — BP 128/74 | HR 91 | Temp 98.0°F | Ht 59.0 in | Wt 135.0 lb

## 2023-01-12 DIAGNOSIS — Z9109 Other allergy status, other than to drugs and biological substances: Secondary | ICD-10-CM

## 2023-01-12 DIAGNOSIS — Z0000 Encounter for general adult medical examination without abnormal findings: Secondary | ICD-10-CM

## 2023-01-12 DIAGNOSIS — Z79899 Other long term (current) drug therapy: Secondary | ICD-10-CM

## 2023-01-12 DIAGNOSIS — E1122 Type 2 diabetes mellitus with diabetic chronic kidney disease: Secondary | ICD-10-CM

## 2023-01-12 DIAGNOSIS — I10 Essential (primary) hypertension: Secondary | ICD-10-CM

## 2023-01-12 DIAGNOSIS — R6889 Other general symptoms and signs: Secondary | ICD-10-CM | POA: Diagnosis not present

## 2023-01-12 DIAGNOSIS — N181 Chronic kidney disease, stage 1: Secondary | ICD-10-CM | POA: Diagnosis not present

## 2023-01-12 DIAGNOSIS — Z0001 Encounter for general adult medical examination with abnormal findings: Secondary | ICD-10-CM

## 2023-01-12 DIAGNOSIS — E663 Overweight: Secondary | ICD-10-CM

## 2023-01-12 DIAGNOSIS — E1140 Type 2 diabetes mellitus with diabetic neuropathy, unspecified: Secondary | ICD-10-CM

## 2023-01-12 DIAGNOSIS — H8109 Meniere's disease, unspecified ear: Secondary | ICD-10-CM | POA: Diagnosis not present

## 2023-01-12 DIAGNOSIS — Z13820 Encounter for screening for osteoporosis: Secondary | ICD-10-CM

## 2023-01-12 DIAGNOSIS — E559 Vitamin D deficiency, unspecified: Secondary | ICD-10-CM | POA: Diagnosis not present

## 2023-01-12 DIAGNOSIS — E785 Hyperlipidemia, unspecified: Secondary | ICD-10-CM

## 2023-01-12 DIAGNOSIS — F4321 Adjustment disorder with depressed mood: Secondary | ICD-10-CM | POA: Diagnosis not present

## 2023-01-12 DIAGNOSIS — E1169 Type 2 diabetes mellitus with other specified complication: Secondary | ICD-10-CM

## 2023-01-12 DIAGNOSIS — K760 Fatty (change of) liver, not elsewhere classified: Secondary | ICD-10-CM | POA: Diagnosis not present

## 2023-01-12 NOTE — Progress Notes (Signed)
ANNUAL WELLNESS VISIT AND FOLLOW UP   Assessment:   Annual Medicare Wellness Visit Due annually  Health maintenance reviewed  Essential hypertension Discussed DASH (Dietary Approaches to Stop Hypertension) DASH diet is lower in sodium than a typical American diet. Cut back on foods that are high in saturated fat, cholesterol, and trans fats. Eat more whole-grain foods, fish, poultry, and nuts Remain active and exercise as tolerated daily.  Monitor BP at home-Call if greater than 130/80.  Check CMP/CBC   Type 2 diabetes mellitus with sensory neuropathy [facility]) Education: Reviewed 'ABCs' of diabetes management  Discussed goals to be met and/or maintained include A1C (<7) Blood pressure (<130/80) Cholesterol (LDL <70) Continue Eye Exam yearly  Continue Dental Exam Q6 mo Discussed dietary recommendations Discussed Physical Activity recommendations Check A1C   Hyperlipidemia associated with T2DM (HCC) Discussed lifestyle modifications. Recommended diet heavy in fruits and veggies, omega 3's. Decrease consumption of animal meats, cheeses, and dairy products. Remain active and exercise as tolerated. Continue to monitor. Check lipids/TSH   CKD 2 associated with T2DM (HCC) Discussed how what you eat and drink can aide in kidney protection. Stay well hydrated. Avoid high salt foods. Avoid NSAIDS. Keep BP and BG well controlled.   Take medications as prescribed. Remain active and exercise as tolerated daily. Maintain weight.  Continue to monitor. Check CMP/GFR/Microablumin  Neuropathy associated with T2DM (HCC) Control sugars, check feet daily.  Podiatry follows   Vitamin D deficiency Continue supplement for goal of 60-100 Monitor Vitamin D levels   Medication management All medications discussed and reviewed in full. All questions and concerns regarding medications addressed.    Meniere's disease, unspecified laterality Continue HCTZ, monitor kidney  function Will refer to new ENT, Dr Ezzard Standing retired  Overweight with comorbidities- BMI 28 Discussed appropriate BMI Diet modification. Physical activity. Encouraged/praised to build confidence.  Fatty liver Weight loss advised, avoid alcohol/tylenol, will monitor LFTs  Environmental allegies Continue OTC antihistamine, singulair Hygiene reviewed  Situational depression Declines meds, managing well with lifestyle   Ophthalmic migraine Occasional, stress related; doing well with conservative therapy - rest/ibuprofen  Screening for osteoporosis DEXA ordered  Orders Placed This Encounter  Procedures   DG Bone Density    Standing Status:   Future    Standing Expiration Date:   01/12/2024    Order Specific Question:   Reason for Exam (SYMPTOM  OR DIAGNOSIS REQUIRED)    Answer:   screening for osteoporosis    Order Specific Question:   Preferred imaging location?    Answer:   [facility]   CBC with Differential/Platelet   COMPLETE METABOLIC PANEL WITH GFR   Lipid panel   Hemoglobin A1c   VITAMIN D 25 Hydroxy (Vit-D Deficiency, Fractures)    Notify office for further evaluation and treatment, questions or concerns if any reported s/s fail to improve.   The patient was advised to call back or seek an in-person evaluation if any symptoms worsen or if the condition fails to improve as anticipated.   Further disposition pending results of labs. Discussed med's effects and SE's.    I discussed the assessment and treatment plan with the patient. The patient was provided an opportunity to ask questions and all were answered. The patient agreed with the plan and demonstrated an understanding of the instructions.  Discussed med's effects and SE's. Screening labs and tests as requested with regular follow-up as recommended.  I provided 35 minutes of face-to-face time during this encounter including counseling, chart review, and critical decision  making was preformed.  Today's  Plan of Care is based on a patient-centered health care approach known as shared decision making - the decisions, tests and treatments allow for patient preferences and values to be balanced with clinical evidence.      Future Appointments  Date Time Provider Department Center  02/10/2023  3:50 PM [facility] MM 2 [facility]  02/28/2023  1:00 PM [facility] DX DEXA 1 [facility]  04/06/2023  9:15 AM Hyatt, Max T, DPM [facility]  09/08/2023 10:00 AM Adela Glimpse, NP [facility] None  03/08/2024 10:00 AM Adela Glimpse, NP [facility] None    Plan:   During the course of the visit the patient was educated and counseled about appropriate screening and preventive services including:   Pneumococcal vaccine  Influenza vaccine Td vaccine Prevnar 13 Screening electrocardiogram Screening mammography Bone densitometry screening Colorectal cancer screening Diabetes screening Glaucoma screening Nutrition counseling  Advanced directives: given info/requested copies   Subjective:   Regina Cervantes is a 72 y.o. female who presents for AWV and follow up. She has Hyperlipidemia associated with type 2 diabetes mellitus; Hypertension; Vitamin D deficiency; Meniere's disease; Medication management; Overweight (BMI 25.0-29.9); Type 2 diabetes mellitus with sensory neuropathy; Fatty liver; CKD stage 2 due to type 1 diabetes mellitus; Environmental allergies; Situational depression; Neuropathy due to type 2 diabetes mellitus; Ophthalmic migraine; Hypokalemia; Arthritis of hand; Bilateral carpal tunnel syndrome; and Pain in right hand on their problem list.  Overall she reports feeling well today.  She has no new or additional cncerns to report in clinic at this time.  She is married, 3 sons, youngest passed at 26 with ruptured pancreatic cyst, 3 grandchildren.   Sister passed due to metastatic kidney cancer in 2021. Brother was diagnosed with esophageal cancer but doing well with  chemo/radiation.    Feels she is managing mood fairly, continues to see improvement.  She is taking melatonin 3 mg for sleep which is is working well.   Has had some occasional ophthalmic migraines improved, Rest and ibuprofen helps.   She has seen Dr. Ezzard Standing for her meniere's, she is taking HCTZ.  She has carpal tunnel bothering her more, has appointment to see Dr. Amanda Pea later this month. Wearing bil braces without improvement.   BMI is Body mass index is 27.27 kg/m., she is working on diet and exercise Wt Readings from Last 3 Encounters:  01/12/23 135 lb (61.2 kg)  09/07/22 132 lb 9.6 oz (60.1 kg)  05/04/22 136 lb 3.2 oz (61.8 kg)   Her blood pressure has been controlled at home, today their BP is BP: 128/74  She does workout, generally active. She denies chest pain, shortness of breath, dizziness.    She is on cholesterol medication (rosuvastatain 10 mg daily) and denies myalgias. Her cholesterol is at goal. The cholesterol last visit was:   Lab Results  Component Value Date   CHOL 143 09/07/2022   HDL 48 (L) 09/07/2022   LDLCALC 74 09/07/2022   TRIG 130 09/07/2022   CHOLHDL 3.0 09/07/2022    She was first diagnosed with DM 10-15 years ago, has been working on diet and exercise for diabetes with DM neuropathy (tingling, manages with topicals, podiatry Dr. Al Corpus following) and CKD II on ACEi, she has done a great job bringing down her A1C with weight loss/phentermine, she is on metformin, bASA, and denies polydipsia, polyuria and visual disturbances. Reports medication compliance. She does check fasting sugars occasionally, ranging 90-110 or so. Last A1C in  the office was:  Lab Results  Component Value Date   HGBA1C 5.8 (H) 09/07/2022    CKD II associated with T2DM, monitored at this office, olmesartan. Last GFR:  Lab Results  Component Value Date   EGFR 94 09/07/2022   Lab Results  Component Value Date   MICRALBCREAT 17 09/07/2022   MICRALBCREAT 8 01/11/2022    MICRALBCREAT 39 (H) 09/07/2021   Patient is on Vitamin D supplement, reduced from 6000 IU to 4000 IU Lab Results  Component Value Date   VD25OH 77 09/07/2022     She takes B12 supplement  Lab Results  Component Value Date   VITAMINB12 1,031 09/07/2021   Has been on slow release iron daily for many years, 50 fe.  Lab Results  Component Value Date   IRON 56 09/07/2021   TIBC 384 09/07/2021   FERRITIN 35 09/07/2021      Medication Review Current Outpatient Medications on File Prior to Visit  Medication Sig   aspirin 81 MG chewable tablet Chew 81 mg by mouth every other day.   BAYER MICROLET LANCETS lancets Use as instructed   bisoprolol-hydrochlorothiazide (ZIAC) 5-6.25 MG tablet TAKE 1 TABLET BY MOUTH EVERY DAY FOR BLOOD PRESSURE   Blood Glucose Monitoring Suppl (CONTOUR NEXT MONITOR) w/Device KIT USE TO CHECK BLOOD SUGAR DAILY   Cholecalciferol (VITAMIN D) 50 MCG (2000 UT) CAPS Take 2 capsules (4,000 Units total) by mouth daily. (Patient taking differently: Take 2,000 Units by mouth daily.)   ferrous sulfate dried (SLOW FE) 160 (50 FE) MG TBCR SR tablet Take 160 mg by mouth daily.   gabapentin (NEURONTIN) 100 MG capsule TAKE 1 CAPSULE BY MOUTH THREE TIMES A DAY   olmesartan (BENICAR) 40 MG tablet Take  1 tablet  Daily  for BP               /TAKE ONE TABLET BY MOUTH DAILY FOR BLOOD PRESSURE   cetirizine (ZYRTEC) 10 MG tablet Take 10 mg by mouth daily. Alternates with Allegra   glucose blood (ONETOUCH ULTRA) test strip CHECK BLOOD SUGAR DAILY   hydrochlorothiazide (HYDRODIURIL) 25 MG tablet TAKE 1 TABLET DAILY FOR BLOOD PRESSURE & FLUID RETENTION (ANKLE SWELLING)   Magnesium 500 MG CAPS Take 500 mg by mouth 3 (three) times daily.  (Patient not taking: Reported on 09/07/2022)   meclizine (ANTIVERT) 25 MG tablet Take 1 tablet 3 x / day as needed for Dizziness   melatonin 3 MG TABS tablet Take 3 mg by mouth at bedtime.   metFORMIN (GLUCOPHAGE-XR) 500 MG 24 hr tablet TAKE 2 TABLETS 2  TIMES A DAY WITH MEALS FOR DIABETES (Patient taking differently: Take one tablet in the morning and 2 tablets in the evening)   ondansetron (ZOFRAN) 8 MG tablet Take 8 mg by mouth every 8 (eight) hours as needed. (Patient not taking: Reported on 09/07/2022)   potassium chloride (KLOR-CON M) 10 MEQ tablet Take 1 tab daily for low potassium.   promethazine (PHENERGAN) 25 MG tablet Take 1 tablet every 4 hours if needed for nausea or dizziness   rosuvastatin (CRESTOR) 10 MG tablet TAKE 1 TABLET BY MOUTH EVERY DAY FOR CHOLESTEROL   vitamin B-12 (CYANOCOBALAMIN) 1000 MCG tablet Take 1,000 mcg by mouth daily.   No current facility-administered medications on file prior to visit.    Allergies: Allergies  Allergen Reactions   Penicillins Other (See Comments)   Lisinopril Cough   Sulfamethoxazole-Trimethoprim Nausea Only   Topamax [Topiramate] Other (See Comments)    Affected  mood, lathargic    Current Problems (verified) has Hyperlipidemia associated with type 2 diabetes mellitus; Hypertension; Vitamin D deficiency; Meniere's disease; Medication management; Overweight (BMI 25.0-29.9); Type 2 diabetes mellitus with sensory neuropathy; Fatty liver; CKD stage 2 due to type 1 diabetes mellitus; Environmental allergies; Situational depression; Neuropathy due to type 2 diabetes mellitus; Ophthalmic migraine; Hypokalemia; Arthritis of hand; Bilateral carpal tunnel syndrome; and Pain in right hand on their problem list.  Screening Tests Immunization History  Administered Date(s) Administered   Fluad Quad(high Dose 65+) 08/27/2021   Influenza Split 07/16/2013, 07/24/2014   Influenza, High Dose Seasonal PF 06/15/2015, 07/04/2016, 07/14/2017, 06/19/2018, 06/03/2019, 09/14/2020   Influenza-Unspecified 07/07/2017, 06/06/2022   PFIZER Comirnaty(Gray Top)Covid-19 Tri-Sucrose Vaccine 03/26/2021   PFIZER(Purple Top)SARS-COV-2 Vaccination 11/09/2019, 12/03/2019, 08/21/2020   PPD Test 04/14/2014   Pfizer  Covid-19 Vaccine Bivalent Booster 52yrs & up 08/27/2021   Pneumococcal Conjugate-13 10/31/2014   Pneumococcal Polysaccharide-23 12/01/2004, 03/23/2016   Tdap 04/06/2011   Zoster Recombinat (Shingrix) 06/06/2022, 08/07/2022   Zoster, Live 07/16/2013   Health Maintenance  Topic Date Due   OPHTHALMOLOGY EXAM  02/18/2021   DTaP/Tdap/Td (2 - Td or Tdap) 04/05/2021   COVID-19 Vaccine (6 - 2023-24 season) 05/20/2022   FOOT EXAM  09/07/2022   MAMMOGRAM  02/08/2023   HEMOGLOBIN A1C  03/09/2023   INFLUENZA VACCINE  04/20/2023   Diabetic kidney evaluation - eGFR measurement  09/08/2023   Diabetic kidney evaluation - Urine ACR  09/08/2023   Medicare Annual Wellness (AWV)  01/12/2024   Fecal DNA (Cologuard)  09/28/2025   Pneumonia Vaccine 66+ Years old  Completed   DEXA SCAN  Completed   Hepatitis C Screening  Completed   Zoster Vaccines- Shingrix  Completed   HPV VACCINES  Aged Out   COLONOSCOPY (Pts 45-4yrs Insurance coverage will need to be confirmed)  Discontinued   Last colonoscopy: 2010 Last cologuard: 09/2021 Negative Due 2026 Last mammogram: 01/2022 - has apt scheduled for 01/2023 Last pap smear/pelvic exam: remote, hysterectomy, DONE DEXA: 2019 - normal Due  TD or Tdap: 2012, wants to boost PRN only  Shingles/Zostavax: 2014, will get shingrix at pharmacy  Covid 19: 2/2, boosted 08/2021.   Names of Other Physician/Practitioners you currently use: 1. [facility]- here for primary care 2. [facility], Dr. Encarnacion Chu, eye doctor, last visit 04/2022, report requested, will be getting new vision provider this year 3. Dr. Renne Crigler, dentist, last visit 2023, goes q29m, looking for a new  Dr. Al Corpus  - podiatry, neuropathy   Patient Care Team: Lucky Cowboy, MD as PCP - General (Internal Medicine) Louis Meckel, MD (Inactive) as Consulting Physician (Gastroenterology) Drema Halon, MD (Inactive) as Consulting Physician  (Otolaryngology) Bunnie Pion, OD (Optometry) Francena Hanly, MD as Consulting Physician (Orthopedic Surgery) Elinor Parkinson, DPM as Consulting Physician (Podiatry)  Surgical: She  has a past surgical history that includes ORIF ankle fracture (Left, 2000); Abdominal hysterectomy (1998); and Shoulder surgery (Right, 02/2017). Family Her family history includes Alcoholism in her mother; Autism in her brother; Breast cancer (age of onset: 43) in her paternal aunt; COPD in her brother; Diabetes in her father; Esophageal cancer (age of onset: 76) in her brother; Heart disease in her father and son; Hypertension in her father; Kidney cancer (age of onset: 58) in her sister; Kidney disease in her son; Liver cancer in her maternal grandmother; Lung cancer in her brother; Pancreatic cancer in her mother. Social history  She reports that she has never smoked.  She has never used smokeless tobacco. She reports current alcohol use. She reports that she does not use drugs.  MEDICARE WELLNESS OBJECTIVES: Physical activity:   Cardiac risk factors:   Depression/mood screen:      01/12/2023   10:25 AM  Depression screen PHQ 2/9  Decreased Interest 0  Down, Depressed, Hopeless 0  PHQ - 2 Score 0    ADLs:     01/12/2023   10:21 AM  In your present state of health, do you have any difficulty performing the following activities:  Hearing? 1  Comment Has meniere's - currently maintained, no attacks.  Vision? 0  Difficulty concentrating or making decisions? 0  Walking or climbing stairs? 0  Dressing or bathing? 0  Doing errands, shopping? 0  Preparing Food and eating ? N  Using the Toilet? N  In the past six months, have you accidently leaked urine? N  Do you have problems with loss of bowel control? N  Managing your Medications? N  Managing your Finances? N  Housekeeping or managing your Housekeeping? N     Cognitive Testing  Alert? Yes  Normal Appearance?Yes  Oriented to person? Yes  Place? Yes    Time? Yes  Recall of three objects?  Yes  Can perform simple calculations? Yes  Displays appropriate judgment?Yes  Can read the correct time from a watch face?Yes  EOL planning: Does Patient Have a Medical Advance Directive?: Yes Type of Advance Directive: Living will    Review of Systems  Constitutional: Negative.  Negative for malaise/fatigue and weight loss.  HENT: Negative.  Negative for hearing loss and tinnitus (intermittent, bil, high pitched, nonpulsatile).   Eyes: Negative.  Negative for blurred vision and double vision. Eye redness: 2. Respiratory: Negative.  Negative for cough, sputum production, shortness of breath and wheezing.   Cardiovascular: Negative.  Negative for chest pain, palpitations, orthopnea, claudication, leg swelling and PND.  Gastrointestinal: Negative.  Negative for abdominal pain, blood in stool, constipation, diarrhea, heartburn, melena, nausea and vomiting.  Genitourinary: Negative.   Musculoskeletal: Negative.  Negative for falls, joint pain and myalgias.  Skin: Negative.  Negative for rash.  Neurological:  Positive for tingling (bil toes, stable). Negative for dizziness (intermittent with menier's, stable/improved), sensory change, weakness and headaches.  Endo/Heme/Allergies: Negative.  Negative for polydipsia.  Psychiatric/Behavioral: Negative.  Negative for depression, memory loss, substance abuse and suicidal ideas. The patient is not nervous/anxious and does not have insomnia.   All other systems reviewed and are negative.   Objective:   Today's Vitals   01/12/23 0954  BP: 128/74  Pulse: 91  Temp: 98 F (36.7 C)  SpO2: 98%  Weight: 135 lb (61.2 kg)  Height: 4\' 11"  (1.499 m)   Body mass index is 27.27 kg/m.  General appearance: alert, no distress, WD/WN,  female HEENT: normocephalic, sclerae anicteric, TMs pearly, nares patent, no discharge or erythema, pharynx normal Oral cavity: MMM, no lesions Neck: supple, no lymphadenopathy, no  thyromegaly, no masses Heart: RRR, normal S1, S2, no murmurs Lungs: CTA bilaterally, no wheezes, rhonchi, or rales Abdomen: +bs, soft, non tender, non distended, no masses, no hepatomegaly, no splenomegaly Musculoskeletal: nontender, no swelling, no obvious deformity Extremities: no edema, no cyanosis, no clubbing Pulses: 2+ symmetric, upper and lower extremities, normal cap refill Neurological: alert, oriented x 3, CN2-12 intact, strength normal upper extremities and lower extremities, sensation mildly dulled to monofilament in toes, otherwise intact throughout, DTRs 2+ throughout, no cerebellar signs, gait normal Psychiatric: normal affect, behavior normal,  pleasant   Medicare Attestation I have personally reviewed: The patient's medical and social history Their use of alcohol, tobacco or illicit drugs Their current medications and supplements The patient's functional ability including ADLs,fall risks, home safety risks, cognitive, and hearing and visual impairment Diet and physical activities Evidence for depression or mood disorders  The patient's weight, height, BMI, and visual acuity have been recorded in the chart.  I have made referrals, counseling, and provided education to the patient based on review of the above and I have provided the patient with a written personalized care plan for preventive services.      Adela Glimpse, NP   01/12/2023

## 2023-01-12 NOTE — Patient Instructions (Signed)

## 2023-01-13 LAB — CBC WITH DIFFERENTIAL/PLATELET
Absolute Monocytes: 574 cells/uL (ref 200–950)
Basophils Absolute: 44 cells/uL (ref 0–200)
Basophils Relative: 0.5 %
Eosinophils Absolute: 261 cells/uL (ref 15–500)
Eosinophils Relative: 3 %
HCT: 38.3 % (ref 35.0–45.0)
Hemoglobin: 12.4 g/dL (ref 11.7–15.5)
Lymphs Abs: 2593 cells/uL (ref 850–3900)
MCH: 27.1 pg (ref 27.0–33.0)
MCHC: 32.4 g/dL (ref 32.0–36.0)
MCV: 83.8 fL (ref 80.0–100.0)
MPV: 10.8 fL (ref 7.5–12.5)
Monocytes Relative: 6.6 %
Neutro Abs: 5229 cells/uL (ref 1500–7800)
Neutrophils Relative %: 60.1 %
Platelets: 245 10*3/uL (ref 140–400)
RBC: 4.57 10*6/uL (ref 3.80–5.10)
RDW: 13.9 % (ref 11.0–15.0)
Total Lymphocyte: 29.8 %
WBC: 8.7 10*3/uL (ref 3.8–10.8)

## 2023-01-13 LAB — LIPID PANEL
Cholesterol: 140 mg/dL (ref <200)
HDL: 49 mg/dL — ABNORMAL LOW (ref >=50)
LDL Cholesterol (Calc): 70 mg/dL (calc)
Non-HDL Cholesterol (Calc): 91 mg/dL (calc) (ref <130)
Total CHOL/HDL Ratio: 2.9 (calc) (ref <5.0)
Triglycerides: 130 mg/dL (ref <150)

## 2023-01-13 LAB — COMPLETE METABOLIC PANEL WITH GFR
AG Ratio: 1.8 (calc) (ref 1.0–2.5)
ALT: 11 U/L (ref 6–29)
AST: 15 U/L (ref 10–35)
Albumin: 4.7 g/dL (ref 3.6–5.1)
Alkaline phosphatase (APISO): 64 U/L (ref 37–153)
BUN: 14 mg/dL (ref 7–25)
CO2: 25 mmol/L (ref 20–32)
Calcium: 9.8 mg/dL (ref 8.6–10.4)
Chloride: 99 mmol/L (ref 98–110)
Creat: 0.6 mg/dL (ref 0.60–1.00)
Globulin: 2.6 g/dL (calc) (ref 1.9–3.7)
Glucose, Bld: 108 mg/dL — ABNORMAL HIGH (ref 65–99)
Potassium: 4.1 mmol/L (ref 3.5–5.3)
Sodium: 135 mmol/L (ref 135–146)
Total Bilirubin: 0.5 mg/dL (ref 0.2–1.2)
Total Protein: 7.3 g/dL (ref 6.1–8.1)
eGFR: 95 mL/min/{1.73_m2} (ref >=60)

## 2023-01-13 LAB — HEMOGLOBIN A1C
Hgb A1c MFr Bld: 6 % of total Hgb — ABNORMAL HIGH (ref <5.7)
Mean Plasma Glucose: 126 mg/dL
eAG (mmol/L): 7 mmol/L

## 2023-01-13 LAB — VITAMIN D 25 HYDROXY (VIT D DEFICIENCY, FRACTURES): Vit D, 25-Hydroxy: 76 ng/mL (ref 30–100)

## 2023-02-10 ENCOUNTER — Ambulatory Visit
Admission: RE | Admit: 2023-02-10 | Discharge: 2023-02-10 | Disposition: A | Discharge status: Home / Self Care | Payer: Medicare HMO | Source: Ambulatory Visit | Attending: Internal Medicine | Admitting: Internal Medicine

## 2023-02-10 DIAGNOSIS — Z1231 Encounter for screening mammogram for malignant neoplasm of breast: Secondary | ICD-10-CM

## 2023-02-26 ENCOUNTER — Other Ambulatory Visit: Payer: Self-pay | Admitting: Nurse Practitioner

## 2023-02-26 DIAGNOSIS — E1140 Type 2 diabetes mellitus with diabetic neuropathy, unspecified: Secondary | ICD-10-CM

## 2023-02-28 ENCOUNTER — Ambulatory Visit
Admission: RE | Admit: 2023-02-28 | Discharge: 2023-02-28 | Disposition: A | Discharge status: Home / Self Care | Payer: Medicare HMO | Source: Ambulatory Visit | Attending: Nurse Practitioner | Admitting: Nurse Practitioner

## 2023-02-28 DIAGNOSIS — E349 Endocrine disorder, unspecified: Secondary | ICD-10-CM | POA: Diagnosis not present

## 2023-02-28 DIAGNOSIS — Z8781 Personal history of (healed) traumatic fracture: Secondary | ICD-10-CM | POA: Diagnosis not present

## 2023-02-28 DIAGNOSIS — E559 Vitamin D deficiency, unspecified: Secondary | ICD-10-CM | POA: Diagnosis not present

## 2023-02-28 DIAGNOSIS — M150 Primary generalized (osteo)arthritis: Secondary | ICD-10-CM | POA: Diagnosis not present

## 2023-02-28 DIAGNOSIS — Z13820 Encounter for screening for osteoporosis: Secondary | ICD-10-CM

## 2023-03-06 ENCOUNTER — Telehealth: Payer: Self-pay | Admitting: Nurse Practitioner

## 2023-03-06 MED ORDER — BISOPROLOL-HYDROCHLOROTHIAZIDE 5-6.25 MG PO TABS: ORAL_TABLET | ORAL | 4 refills | Status: DC

## 2023-03-06 NOTE — Addendum Note (Signed)
Addended by: Dionicio Stall on: 03/06/2023 10:54 AM   Modules accepted: Orders

## 2023-03-06 NOTE — Telephone Encounter (Signed)
Pt is requesting refill on bisoprolol-hydrochlorothiazide. Pls send to [facility] on file.

## 2023-03-09 ENCOUNTER — Ambulatory Visit: Payer: Medicare HMO | Admitting: Nurse Practitioner

## 2023-03-20 ENCOUNTER — Other Ambulatory Visit: Payer: Self-pay

## 2023-03-20 DIAGNOSIS — E1140 Type 2 diabetes mellitus with diabetic neuropathy, unspecified: Secondary | ICD-10-CM

## 2023-03-20 MED ORDER — ONETOUCH ULTRA VI STRP
ORAL_STRIP | 3 refills | Status: AC
Start: 2023-03-20 — End: ?

## 2023-04-06 ENCOUNTER — Ambulatory Visit: Payer: Medicare HMO | Admitting: Podiatry

## 2023-04-06 DIAGNOSIS — D2371 Other benign neoplasm of skin of right lower limb, including hip: Secondary | ICD-10-CM

## 2023-04-06 DIAGNOSIS — B351 Tinea unguium: Secondary | ICD-10-CM

## 2023-04-06 DIAGNOSIS — D2372 Other benign neoplasm of skin of left lower limb, including hip: Secondary | ICD-10-CM | POA: Diagnosis not present

## 2023-04-06 DIAGNOSIS — M79676 Pain in unspecified toe(s): Secondary | ICD-10-CM | POA: Diagnosis not present

## 2023-04-06 NOTE — Progress Notes (Signed)
She presents today for routine footcare.  States that her toenails are bothering her.  States that she has 1 different pair of shoes recently after wearing that she noticed a dark area on the proximal medial aspect of the hallux nail plate left.  She states that it really does not hurt however she is noticing the discoloration to that possibly be associated with issues.  Objective: Vital signs stable alert and oriented x 3.  There is no erythema Dem salines drainage or odor nails on the right foot are much more thick dystrophic onychomycotic as to the left foot does demonstrate a subungual hematoma proximal medial nail fold and nail plate.  There is no erythema cellulitis drainage or odor associated with this.  Her toenails are also thick on left foot.  But appear to be less mycotic.  Assessment: Pain in limb secondary to thick possibly mycotic nails nail dystrophy.  Plan: Debridement of nails 1 through 5 bilateral.

## 2023-04-23 ENCOUNTER — Encounter: Payer: Self-pay | Admitting: Internal Medicine

## 2023-04-23 NOTE — Progress Notes (Unsigned)
Future Appointments  Date Time Provider Department  04/24/2023                       3 mo ov 10:30 AM Lucky Cowboy, MD [facility]  07/06/2023 10:15 AM Ernestene Kiel T, DPM [facility]  09/08/2023                    cpe 10:00 AM Adela Glimpse, NP [facility]  03/08/2024 10:00 AM Adela Glimpse, NP [facility]     History of Present Illness:       This very nice 72 y.o.  MWF presents for 3 month follow up with HTN, HLD, Pre-Diabetes and Vitamin D Deficiency.         Patient is treated for HTN (2004)  & BP has been controlled at home. Today's BP is at goal - 122/70 . Patient has had no complaints of any cardiac type chest pain, palpitations, dyspnea Pollyann Kennedy /PND, dizziness, claudication or dependent edema.        Hyperlipidemia is controlled with diet & Rosuvastatin .   Patient denies myalgias or other med SE's. Last Lipids were at goal :  Lab Results  Component Value Date   CHOL 140 01/12/2023   HDL 49 (L) 01/12/2023   LDLCALC 70 01/12/2023   TRIG 130 01/12/2023   CHOLHDL 2.9 01/12/2023     Also, the patient has  Moderate Obesity (BMI 29.9) & consequent  T2_NIDDM/CKD2 (GFR 85) since 2004 history of T2_NIDDM and has had no symptoms of reactive hypoglycemia, diabetic polys, paresthesias or visual blurring.  Last A1c was not at goal:  Lab Results  Component Value Date   HGBA1C 6.0 (H) 01/12/2023                                                         Further, the patient also has history of Vitamin D Deficiency  ("37" /2014) and supplements vitamin D without any suspected side-effects. Last vitamin D was at goal:   Lab Results  Component Value Date   VD25OH 76 01/12/2023      Current Outpatient Medications  Medication Instructions   aspirin  81 mg  Every other day   bisoprolol-hctz   5-6.25 MG tablet TAKE 1 TABLET \\EVERY DAY   cetirizine  10 mg  Daily, Alternates with Allegra   VITAMIN B12 1,000 mcg, Oral, Daily   ferrous sulfate dried (SLOW FE) 160 mg, Daily    gabapentin  100 MG capsule TAKE 1 CAPSULE  THREE TIMES A DAY   hydrochlorothiazide  25 MG tablet TAKE 1 TABLET DAILY    Magnesium 500 mg, 3 times daily   meclizine (ANTIVERT) 25 MG tablet Take 1 tablet 3 x / day as needed for Dizziness   melatonin 3 mg   at bedtime   metFORMIN-XR 500 MG 24 hr tablet TAKE 1 TABLET I AM AND 2 TABS  PM     olmesartan (BENICAR) 40 MG tablet Take  1 tablet  Daily  for BP         ondansetron (ZOFRAN) 8 mg, Every 8 hours PRN   potassium chloride 10 MEQ tablet Take 1 tab daily for low potassium.   promethazine (25 MG tablet Take 1 tablet every 4 hours if needed for nausea  rosuvastatin 10 MG tablet TAKE 1 TABLET EVERY DAY    Vitamin D 4,000 Units, Oral, Daily     Allergies  Allergen Reactions   Penicillins Other (See Comments)   Lisinopril Cough   Topamax [Topiramate] Other (See Comments)    Affected mood, lathargic     PMHx:   Past Medical History:  Diagnosis Date   Allergy    Closed fracture of proximal phalanx of great toe 03/06/2018   Hyperlipidemia    Hypertension    Iron deficiency    Meniere's disease    Neuropathy    Type II or unspecified type diabetes mellitus without mention of complication, not stated as uncontrolled    Vitamin D deficiency      Immunization History  Administered Date(s) Administered   Influenza Split 07/16/2013, 07/24/2014   Influenza, High Dose  06/19/2018, 06/03/2019, 09/14/2020   PFIZER- SARS-COV-2 Vacc 11/09/2019, 12/03/2019, 08/21/2020, 03/26/2021   PPD Test 04/14/2014   Pneumococcal -13 10/31/2014   Pneumococcal -23 12/01/2004, 03/23/2016   Tdap 04/06/2011   Zoster, Live 07/16/2013     Past Surgical History:  Procedure Laterality Date   ABDOMINAL HYSTERECTOMY  1998   BSO   ORIF ANKLE FRACTURE Left 2000   SHOULDER SURGERY Right 02/2017   rotator cuff repair Dr. Darol Destine    FHx:    Reviewed / unchanged  SHx:    Reviewed / unchanged   Systems Review:  Constitutional: Denies fever, chills,  wt changes, headaches, insomnia, fatigue, night sweats, change in appetite. Eyes: Denies redness, blurred vision, diplopia, discharge, itchy, watery eyes.  ENT: Denies discharge, congestion, post nasal drip, epistaxis, sore throat, earache, hearing loss, dental pain, tinnitus, vertigo, sinus pain, snoring.  CV: Denies chest pain, palpitations, irregular heartbeat, syncope, dyspnea, diaphoresis, orthopnea, PND, claudication or edema. Respiratory: denies cough, dyspnea, DOE, pleurisy, hoarseness, laryngitis, wheezing.  Gastrointestinal: Denies dysphagia, odynophagia, heartburn, reflux, water brash, abdominal pain or cramps, nausea, vomiting, bloating, diarrhea, constipation, hematemesis, melena, hematochezia  or hemorrhoids. Genitourinary: Denies dysuria, frequency, urgency, nocturia, hesitancy, discharge, hematuria or flank pain. Musculoskeletal: Denies arthralgias, myalgias, stiffness, jt. swelling, pain, limping or strain/sprain.  Skin: Denies pruritus, rash, hives, warts, acne, eczema or change in skin lesion(s). Neuro: No weakness, tremor, incoordination, spasms, paresthesia or pain. Psychiatric: Denies confusion, memory loss or sensory loss. Endo: Denies change in weight, skin or hair change.  Heme/Lymph: No excessive bleeding, bruising or enlarged lymph nodes.  Physical Exam  BP 122/70   Pulse 100   Temp 97.9 F (36.6 C)   Resp 16   Ht 4\' 11"  (1.499 m)   Wt 131 lb 9.6 oz (59.7 kg)   SpO2 99%   BMI 26.58 kg/m   Appears  well nourished, well groomed  and in no distress.  Eyes: PERRLA, EOMs, conjunctiva no swelling or erythema. Sinuses: No frontal/maxillary tenderness ENT/Mouth: EAC's clear, TM's nl w/o erythema, bulging. Nares clear w/o erythema, swelling, exudates. Oropharynx clear without erythema or exudates. Oral hygiene is good. Tongue normal, non obstructing. Hearing intact.  Neck: Supple. Thyroid not palpable. Car 2+/2+ without bruits, nodes or JVD. Chest: Respirations nl  with BS clear & equal w/o rales, rhonchi, wheezing or stridor.  Cor: Heart sounds normal w/ regular rate and rhythm without sig. murmurs, gallops, clicks or rubs. Peripheral pulses normal and equal  without edema.  Abdomen: Soft & bowel sounds normal. Non-tender w/o guarding, rebound, hernias, masses or organomegaly.  Lymphatics: Unremarkable.  Musculoskeletal: Full ROM all peripheral extremities, joint stability, 5/5 strength and normal gait.  Skin: Warm, dry without exposed rashes, lesions or ecchymosis apparent.  Neuro: Cranial nerves intact, reflexes equal bilaterally. Sensation intact to touch  and decreased to vibratory and Monofilament to the toes bilaterally.  Motor testing grossly intact. Tendon reflexes grossly flat .  Pysch: Alert & oriented x 3.  Insight and judgement nl & appropriate. No ideations.  Assessment and Plan:   1. Essential hypertension  - CBC with Differential/Platelet - COMPLETE METABOLIC PANEL WITH GFR - Magnesium - TSH   2. Hyperlipidemia associated with type 2 diabetes mellitus (HCC)  - Lipid panel - TSH   3. Type 2 diabetes mellitus with stage 2 chronic kidney                                                isease, without long-term current use of insulin (HCC)  - Hemoglobin A1c - Insulin, random   4. Vitamin D deficiency  - VITAMIN D 25 Hydroxy   5. Type 2 diabetes mellitus with sensory neuropathy (HCC)  - Hemoglobin A1c   6. Medication management  - CBC with Differential/Platelet - COMPLETE METABOLIC PANEL WITH GFR - Magnesium - Lipid panel - TSH - Hemoglobin A1c - Insulin, random - VITAMIN D 25 Hydroxy          Discussed  regular exercise, BP monitoring, weight control to achieve/maintain BMI less than 25 and discussed med and SE's. Recommended labs to assess and monitor clinical status with further disposition pending results of labs.  I discussed the assessment and treatment plan with the patient. The patient was provided an  opportunity to ask questions and all were answered. The patient agreed with the plan and demonstrated an understanding of the instructions.  I provided over 30 minutes of exam, counseling, chart review and  complex critical decision making.        The patient was advised to call back or seek an in-person evaluation if the symptoms worsen or if the condition fails to improve as anticipated.   Marinus Maw, MD

## 2023-04-23 NOTE — Patient Instructions (Signed)

## 2023-04-24 ENCOUNTER — Encounter: Payer: Self-pay | Admitting: Internal Medicine

## 2023-04-24 ENCOUNTER — Ambulatory Visit (INDEPENDENT_AMBULATORY_CARE_PROVIDER_SITE_OTHER): Payer: Medicare HMO | Admitting: Internal Medicine

## 2023-04-24 VITALS — BP 122/70 | HR 100 | Temp 97.9°F | Resp 16 | Ht 59.0 in | Wt 131.6 lb

## 2023-04-24 DIAGNOSIS — E1169 Type 2 diabetes mellitus with other specified complication: Secondary | ICD-10-CM | POA: Diagnosis not present

## 2023-04-24 DIAGNOSIS — I10 Essential (primary) hypertension: Secondary | ICD-10-CM | POA: Diagnosis not present

## 2023-04-24 DIAGNOSIS — E1140 Type 2 diabetes mellitus with diabetic neuropathy, unspecified: Secondary | ICD-10-CM | POA: Diagnosis not present

## 2023-04-24 DIAGNOSIS — Z79899 Other long term (current) drug therapy: Secondary | ICD-10-CM

## 2023-04-24 DIAGNOSIS — E785 Hyperlipidemia, unspecified: Secondary | ICD-10-CM | POA: Diagnosis not present

## 2023-04-24 DIAGNOSIS — E1122 Type 2 diabetes mellitus with diabetic chronic kidney disease: Secondary | ICD-10-CM

## 2023-04-24 DIAGNOSIS — N182 Chronic kidney disease, stage 2 (mild): Secondary | ICD-10-CM

## 2023-04-24 DIAGNOSIS — E559 Vitamin D deficiency, unspecified: Secondary | ICD-10-CM | POA: Diagnosis not present

## 2023-04-25 NOTE — Progress Notes (Signed)
^<^<^<^<^<^<^<^<^<^<^<^<^<^<^<^<^<^<^<^<^<^<^<^<^<^<^<^<^<^<^<^<^<^<^<^<^ ^>^>^>^>^>^>^>^>^>^>^>>^>^>^>^>^>^>^>^>^>^>^>^>^>^>^>^>^>^>^>^>^>^>^>^>^>  -Test results slightly outside the reference range are not unusual. If there is anything important, I will review this with you,  otherwise it is considered normal test values.  If you have further questions,  please do not hesitate to contact me at the office or via My Chart.   ^<^<^<^<^<^<^<^<^<^<^<^<^<^<^<^<^<^<^<^<^<^<^<^<^<^<^<^<^<^<^<^<^<^<^<^<^ ^>^>^>^>^>^>^>^>^>^>^>^>^>^>^>^>^>^>^>^>^>^>^>^>^>^>^>^>^>^>^>^>^>^>^>^>^  -  A1c = 5.8% - still slightly elevated  in the borderline and                                                           early or pre-diabetes range which has the same   300% increased risk for heart attack, stroke, cancer and  alzheimer- type vascular dementia as full blown diabetes.   But the good news is that diet, exercise with  weight loss can cure the early diabetes at this point.    -  It is very important that you work harder with diet by                            avoiding all foods that are white except chicken, fish &  calliflower.  - Avoid white rice  (brown & wild rice is OK),   - Avoid white potatoes  (sweet potatoes in moderation is OK),   White bread or wheat bread or anything made out of                                                                                                                                                                                                                                                              white flour like bagels, donuts, rolls, buns, biscuits, cakes,  - pastries, cookies, pizza crust, and pasta (made from white flour & egg whites)   - vegetarian pasta or spinach or wheat pasta is OK.  - Multigrain breads like Arnold's, Pepperidge Farm or                                                        multigrain sandwich thins or high fiber breads like   [place] bread or "Dave's Killer" breads that are 4 to 5 grams fiber per slice !  are best.    Diet, exercise and weight loss can reverse and cure diabetes in the early stages.   ^<^<^<^<^<^<^<^<^<^<^<^<^<^<^<^<^<^<^<^<^<^<^<^<^<^<^<^<^<^<^<^<^<^<^<^<^ ^>^>^>^>^>^>^>^>^>^>^>^>^>^>^>^>^>^>^>^>^>^>^>^>^>^>^>^>^>^>^>^>^>^>^>^>^  -   Magnesium  =  1.5  is    very  low   - goal is betw 2.0 - 2.5,   - So..............Marland Kitchen  Recommend that you take Magnesium 500 mg tablet  2 x /day with Meals                                                                                                                                                                  -  also important to eat lots of  leafy green vegetables   - spinach - Kale - collards - greens - okra - asparagus - broccoli - quinoa   - squash - almonds - black, red, white beans-  peas - green beans  ^<^<^<^<^<^<^<^<^<^<^<^<^<^<^<^<^<^<^<^<^<^<^<^<^<^<^<^<^<^<^<^<^<^<^<^<^ ^>^>^>^>^>^>^>^>^>^>^>^>^>^>^>^>^>^>^>^>^>^>^>^>^>^>^>^>^>^>^>^>^>^>^>^>^  -  Chol = 139 - is Wonderful     - Very low risk for Heart Attack  /  Stroke  ^>^>^>^>^>^>^>^>^>^>^>^>^>^>^>^>^>^>^>^>^>^>^>^>^>^>^>^>^>^>^>^>^>^>^>^>^ ^>^>^>^>^>^>^>^>^>^>^>^>^>^>^>^>^>^>^>^>^>^>^>^>^>^>^>^>^>^>^>^>^>^>^>^>^  -  Vitamin D = 84  - Excellent   - Please keep dosage  same   ^<^<^<^<^<^<^<^<^<^<^<^<^<^<^<^<^<^<^<^<^<^<^<^<^<^<^<^<^<^<^<^<^<^<^<^<^ ^>^>^>^>^>^>^>^>^>^>^>^>^>^>^>^>^>^>^>^>^>^>^>^>^>^>^>^>^>^>^>^>^>^>^>^>^  All Else - CBC - Kidneys - Electrolytes - Liver - Magnesium & Thyroid    - all  Normal / OK  ^<^<^<^<^<^<^<^<^<^<^<^<^<^<^<^<^<^<^<^<^<^<^<^<^<^<^<^<^<^<^<^<^<^<^<^<^ ^>^>^>^>^>^>^>^>^>^>^>^>^>^>^>^>^>^>^>^>^>^>^>^>^>^>^>^>^>^>^>^>^>^>^>^>^

## 2023-05-10 ENCOUNTER — Other Ambulatory Visit: Payer: Self-pay | Admitting: Nurse Practitioner

## 2023-05-10 DIAGNOSIS — E1140 Type 2 diabetes mellitus with diabetic neuropathy, unspecified: Secondary | ICD-10-CM

## 2023-06-23 DIAGNOSIS — H25012 Cortical age-related cataract, left eye: Secondary | ICD-10-CM | POA: Diagnosis not present

## 2023-06-23 DIAGNOSIS — H5203 Hypermetropia, bilateral: Secondary | ICD-10-CM | POA: Diagnosis not present

## 2023-06-23 DIAGNOSIS — H11153 Pinguecula, bilateral: Secondary | ICD-10-CM | POA: Diagnosis not present

## 2023-06-23 DIAGNOSIS — E119 Type 2 diabetes mellitus without complications: Secondary | ICD-10-CM | POA: Diagnosis not present

## 2023-06-23 DIAGNOSIS — H40033 Anatomical narrow angle, bilateral: Secondary | ICD-10-CM | POA: Diagnosis not present

## 2023-06-23 DIAGNOSIS — H2513 Age-related nuclear cataract, bilateral: Secondary | ICD-10-CM | POA: Diagnosis not present

## 2023-06-23 DIAGNOSIS — G43109 Migraine with aura, not intractable, without status migrainosus: Secondary | ICD-10-CM | POA: Diagnosis not present

## 2023-06-23 DIAGNOSIS — H40053 Ocular hypertension, bilateral: Secondary | ICD-10-CM | POA: Diagnosis not present

## 2023-06-23 DIAGNOSIS — Z7984 Long term (current) use of oral hypoglycemic drugs: Secondary | ICD-10-CM | POA: Diagnosis not present

## 2023-06-30 DIAGNOSIS — H5203 Hypermetropia, bilateral: Secondary | ICD-10-CM | POA: Diagnosis not present

## 2023-07-06 ENCOUNTER — Ambulatory Visit: Payer: Medicare HMO | Admitting: Podiatry

## 2023-07-11 ENCOUNTER — Other Ambulatory Visit: Payer: Self-pay | Admitting: Nurse Practitioner

## 2023-07-11 DIAGNOSIS — E1140 Type 2 diabetes mellitus with diabetic neuropathy, unspecified: Secondary | ICD-10-CM

## 2023-07-13 IMAGING — MG MM DIGITAL SCREENING BILAT W/ TOMO AND CAD
6 of 12 series · 6 of 36 positions shown · non-contrast
Comparison: Previous exam(s).

ACR Breast Density Category a: The breast tissue is almost entirely
fatty.

CLINICAL DATA: Screening.

EXAM:
DIGITAL SCREENING BILATERAL MAMMOGRAM WITH TOMOSYNTHESIS AND CAD
TECHNIQUE: Bilateral screening digital craniocaudal and mediolateral oblique
mammograms were obtained. Bilateral screening digital breast
tomosynthesis was performed. The images were evaluated with
computer-aided detection.

[R CV synth-2D]
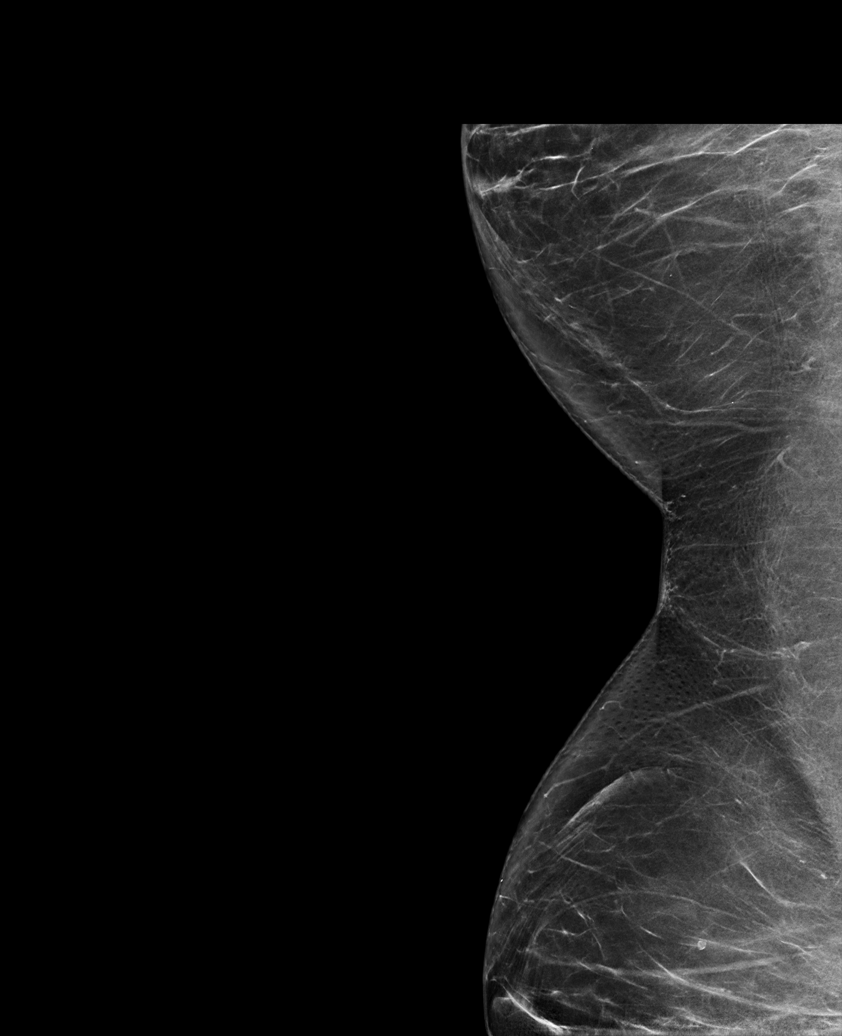

[R CC synth-2D]
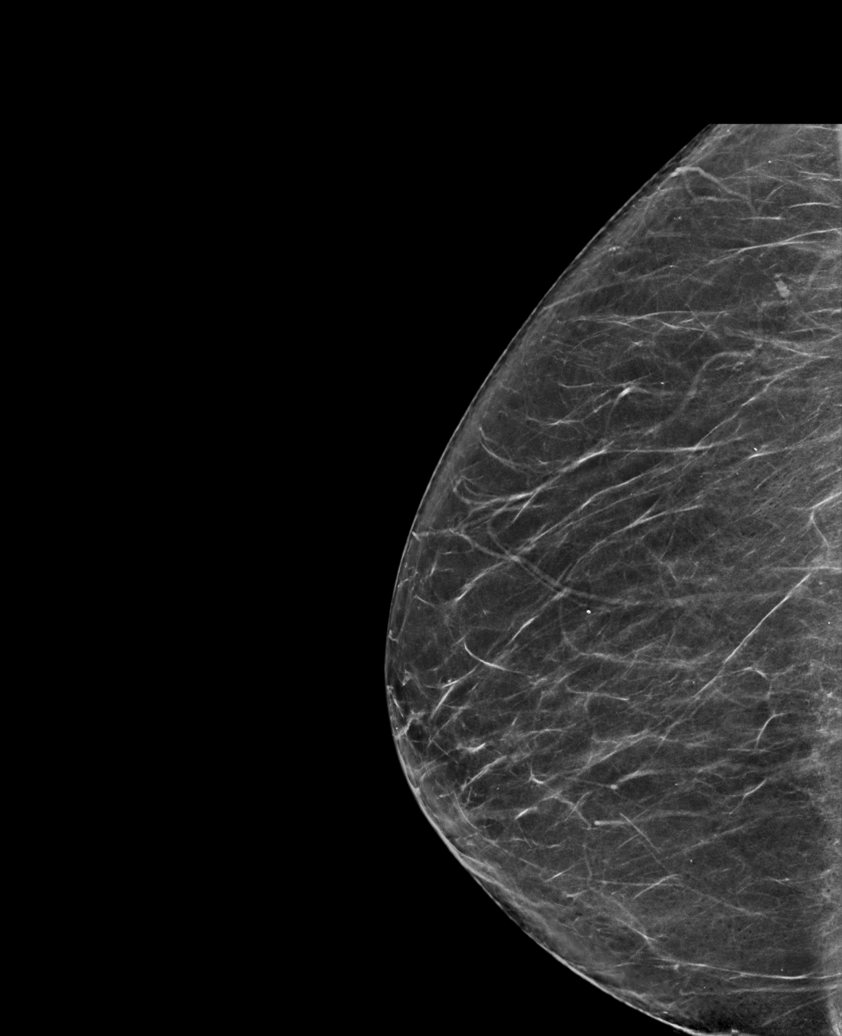

[L MLO synth-2D]
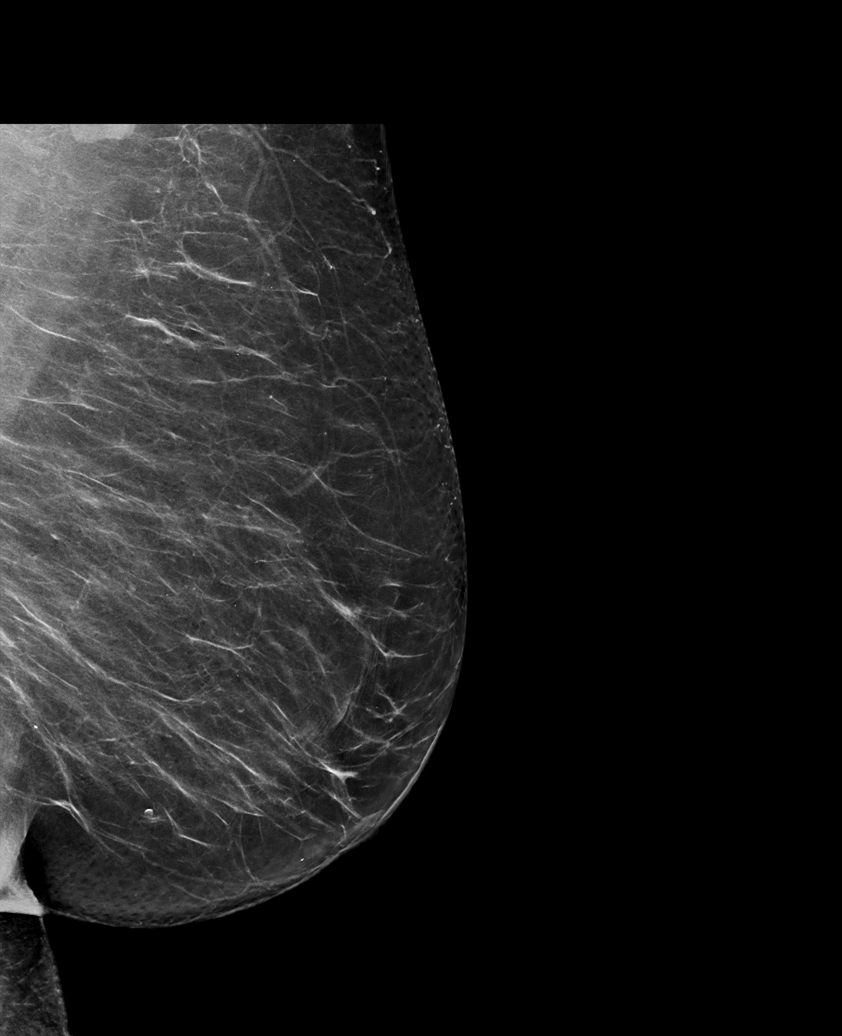

[R MLO synth-2D (1 of 2)]
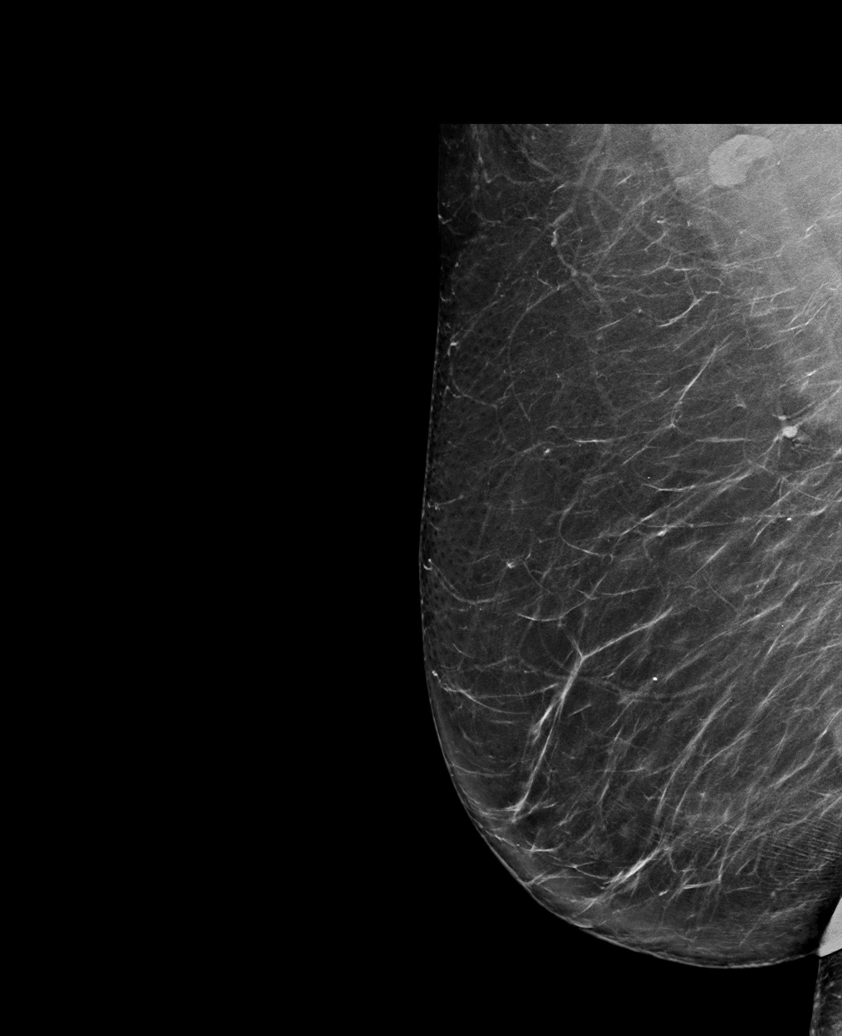

[L CC synth-2D]
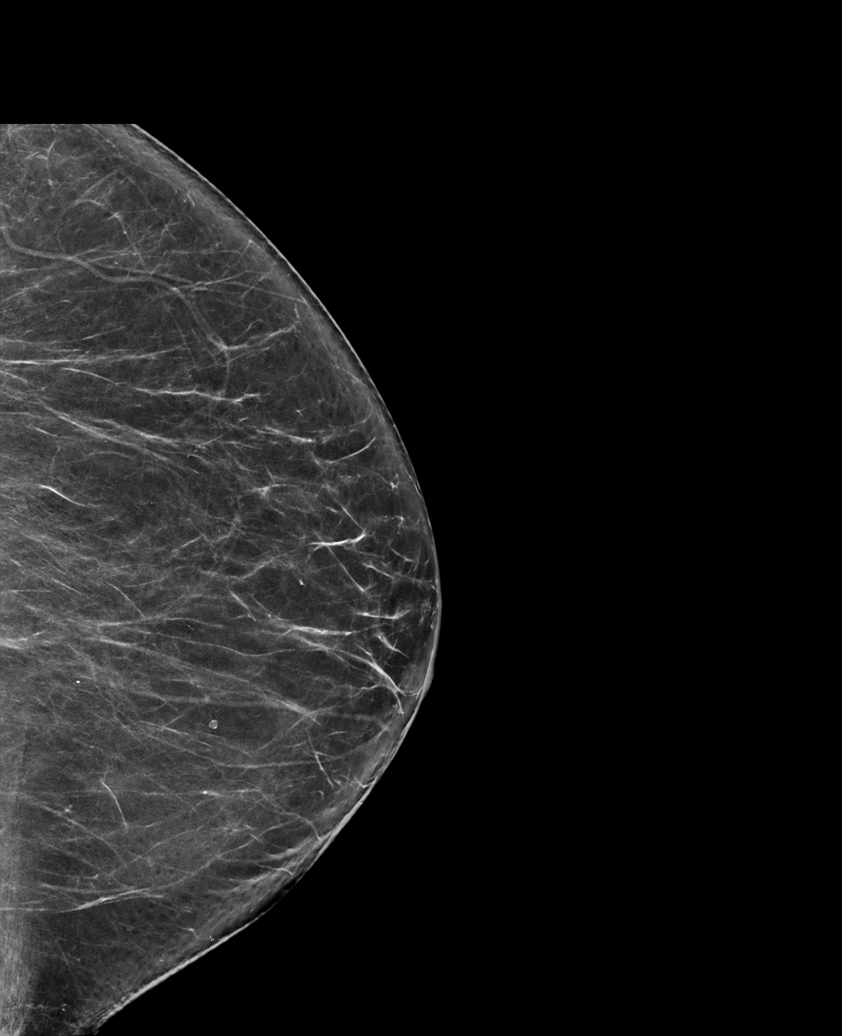

[R MLO synth-2D (2 of 2)]
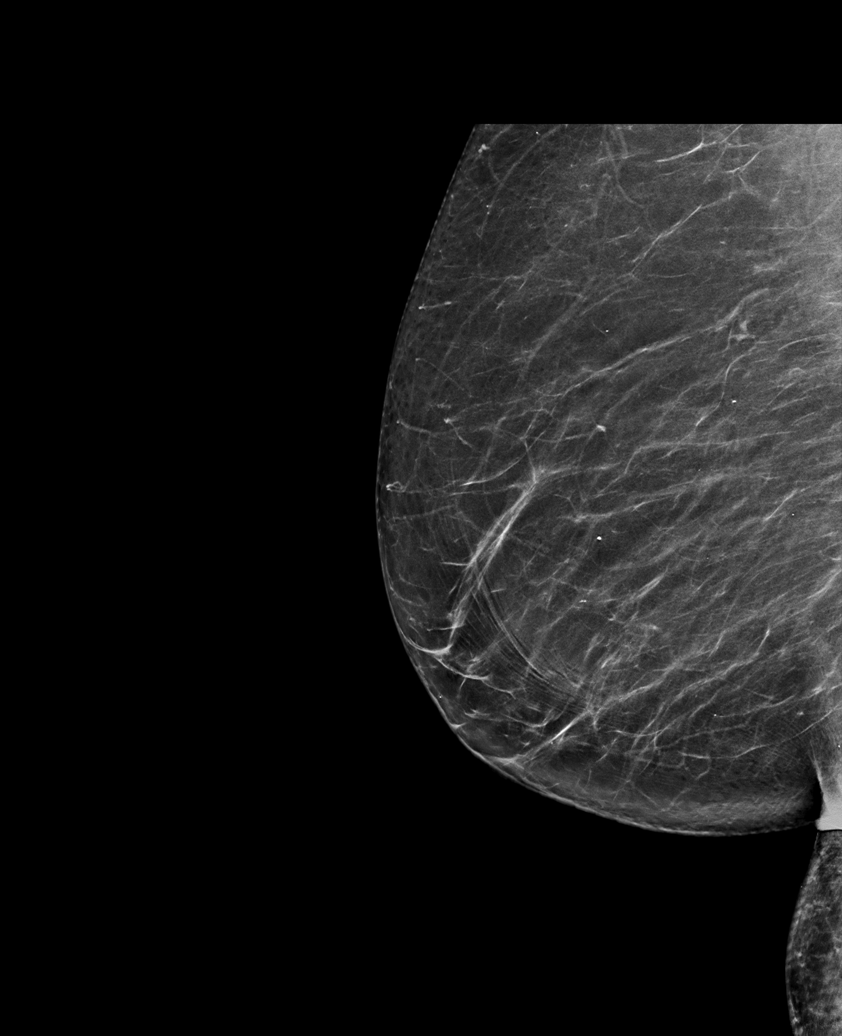

[6 of 36 positions shown; findings below may reference images not displayed]

FINDINGS: There are no findings suspicious for malignancy.
IMPRESSION: No mammographic evidence of malignancy. A result letter of this
screening mammogram will be mailed directly to the patient.

RECOMMENDATION:
Screening mammogram in one year. (Code:0E-3-N98)

BI-RADS CATEGORY  1: Negative.

## 2023-07-27 ENCOUNTER — Ambulatory Visit: Payer: Medicare HMO | Admitting: Podiatry

## 2023-07-27 ENCOUNTER — Encounter: Payer: Self-pay | Admitting: Podiatry

## 2023-07-27 DIAGNOSIS — D2371 Other benign neoplasm of skin of right lower limb, including hip: Secondary | ICD-10-CM | POA: Diagnosis not present

## 2023-07-27 DIAGNOSIS — B351 Tinea unguium: Secondary | ICD-10-CM

## 2023-07-27 DIAGNOSIS — D2372 Other benign neoplasm of skin of left lower limb, including hip: Secondary | ICD-10-CM

## 2023-07-27 DIAGNOSIS — M79676 Pain in unspecified toe(s): Secondary | ICD-10-CM

## 2023-07-27 NOTE — Progress Notes (Signed)
She presents today chief complaint of painful elongated toenails with calluses bilaterally.  Objective: Vital signs are stable alert oriented x 3.  Toenails are long thick yellow dystrophic clinically mycotic benign skin lesion.  Assessment: Pain in limb secondary to onychomycosis benign skin lesion.  Plan: Debridement of toenails and benign skin lesions bilateral.

## 2023-08-02 ENCOUNTER — Encounter: Payer: Self-pay | Admitting: Nurse Practitioner

## 2023-08-02 ENCOUNTER — Ambulatory Visit (INDEPENDENT_AMBULATORY_CARE_PROVIDER_SITE_OTHER): Payer: Medicare HMO | Admitting: Nurse Practitioner

## 2023-08-02 VITALS — BP 142/70 | HR 83 | Temp 97.5°F | Ht 59.0 in | Wt 139.8 lb

## 2023-08-02 DIAGNOSIS — E663 Overweight: Secondary | ICD-10-CM

## 2023-08-02 DIAGNOSIS — K760 Fatty (change of) liver, not elsewhere classified: Secondary | ICD-10-CM

## 2023-08-02 DIAGNOSIS — E1022 Type 1 diabetes mellitus with diabetic chronic kidney disease: Secondary | ICD-10-CM | POA: Diagnosis not present

## 2023-08-02 DIAGNOSIS — E1169 Type 2 diabetes mellitus with other specified complication: Secondary | ICD-10-CM

## 2023-08-02 DIAGNOSIS — E559 Vitamin D deficiency, unspecified: Secondary | ICD-10-CM

## 2023-08-02 DIAGNOSIS — G43109 Migraine with aura, not intractable, without status migrainosus: Secondary | ICD-10-CM

## 2023-08-02 DIAGNOSIS — F4321 Adjustment disorder with depressed mood: Secondary | ICD-10-CM

## 2023-08-02 DIAGNOSIS — Z23 Encounter for immunization: Secondary | ICD-10-CM

## 2023-08-02 DIAGNOSIS — N182 Chronic kidney disease, stage 2 (mild): Secondary | ICD-10-CM | POA: Diagnosis not present

## 2023-08-02 DIAGNOSIS — H8109 Meniere's disease, unspecified ear: Secondary | ICD-10-CM

## 2023-08-02 DIAGNOSIS — Z79899 Other long term (current) drug therapy: Secondary | ICD-10-CM | POA: Diagnosis not present

## 2023-08-02 DIAGNOSIS — E785 Hyperlipidemia, unspecified: Secondary | ICD-10-CM | POA: Diagnosis not present

## 2023-08-02 DIAGNOSIS — E1140 Type 2 diabetes mellitus with diabetic neuropathy, unspecified: Secondary | ICD-10-CM | POA: Diagnosis not present

## 2023-08-02 DIAGNOSIS — I10 Essential (primary) hypertension: Secondary | ICD-10-CM

## 2023-08-02 NOTE — Patient Instructions (Signed)

## 2023-08-02 NOTE — Progress Notes (Signed)
FOLLOW UP   Assessment:   Essential hypertension Discussed DASH (Dietary Approaches to Stop Hypertension) DASH diet is lower in sodium than a typical American diet. Cut back on foods that are high in saturated fat, cholesterol, and trans fats. Eat more whole-grain foods, fish, poultry, and nuts Remain active and exercise as tolerated daily.  Monitor BP at home-Call if greater than 130/80.  Check CMP/CBC  Type 2 diabetes mellitus with sensory neuropathy [facility]) Education: Reviewed 'ABCs' of diabetes management  Discussed goals to be met and/or maintained include A1C (<7) Blood pressure (<130/80) Cholesterol (LDL <70) Continue Eye Exam yearly  Continue Dental Exam Q6 mo Discussed dietary recommendations Discussed Physical Activity recommendations Check A1C  Hyperlipidemia associated with T2DM (HCC) Discussed lifestyle modifications. Recommended diet heavy in fruits and veggies, omega 3's. Decrease consumption of animal meats, cheeses, and dairy products. Remain active and exercise as tolerated. Continue to monitor. Check lipids/TSH  CKD 2 associated with T2DM (HCC) Discussed how what you eat and drink can aide in kidney protection. Stay well hydrated. Avoid high salt foods. Avoid NSAIDS. Keep BP and BG well controlled.   Take medications as prescribed. Remain active and exercise as tolerated daily. Maintain weight.  Continue to monitor. Check CMP/GFR/Microablumin  Neuropathy associated with T2DM (HCC) Control sugars, check feet daily.  Podiatry follows   Vitamin D deficiency Continue supplement for goal of 60-100 Monitor Vitamin D levels  Medication management All medications discussed and reviewed in full. All questions and concerns regarding medications addressed.    Meniere's disease, unspecified laterality Continue HCTZ, monitor kidney function Follows ENT, Dr Ezzard Standing   Overweight with comorbidities- BMI 28 Discussed appropriate BMI Diet  modification. Physical activity. Encouraged/praised to build confidence.  Fatty liver Weight loss advised, avoid alcohol/tylenol, will monitor LFTs  Environmental allegies Continue OTC antihistamine, singulair Hygiene reviewed  Situational depression Declines meds, managing well with lifestyle   Ophthalmic migraine Occasional, stress related; doing well with conservative therapy - rest/ibuprofen  Flu vaccine need Administered without complication.   Orders Placed This Encounter  Procedures   Flu vaccine HIGH DOSE PF(Fluzone Trivalent)   CBC with Differential/Platelet   COMPLETE METABOLIC PANEL WITH GFR   Lipid panel   Hemoglobin A1c   Notify office for further evaluation and treatment, questions or concerns if any reported s/s fail to improve.   The patient was advised to call back or seek an in-person evaluation if any symptoms worsen or if the condition fails to improve as anticipated.   Further disposition pending results of labs. Discussed med's effects and SE's.    I discussed the assessment and treatment plan with the patient. The patient was provided an opportunity to ask questions and all were answered. The patient agreed with the plan and demonstrated an understanding of the instructions.  Discussed med's effects and SE's. Screening labs and tests as requested with regular follow-up as recommended.  I provided 35 minutes of face-to-face time during this encounter including counseling, chart review, and critical decision making was preformed.  Today's Plan of Care is based on a patient-centered health care approach known as shared decision making - the decisions, tests and treatments allow for patient preferences and values to be balanced with clinical evidence.    Future Appointments  Date Time Provider Department Center  09/08/2023 10:00 AM Adela Glimpse, NP [facility] None  10/31/2023  9:15 AM Dickens, [place] T, DPM [facility]  12/14/2023  9:30 AM  Adela Glimpse, NP [facility] None  03/18/2024  9:30 AM Lucky Cowboy,  MD [facility] None  06/18/2024  9:30 AM Adela Glimpse, NP [facility] None    Plan:   During the course of the visit the patient was educated and counseled about appropriate screening and preventive services including:   Pneumococcal vaccine  Influenza vaccine Td vaccine Prevnar 13 Screening electrocardiogram Screening mammography Bone densitometry screening Colorectal cancer screening Diabetes screening Glaucoma screening Nutrition counseling  Advanced directives: given info/requested copies   Subjective:   Regina Cervantes is a 72 y.o. female who presents for a general follow up. She has Hyperlipidemia associated with type 2 diabetes mellitus (HCC); Hypertension; Vitamin D deficiency; Meniere's disease; Medication management; Overweight (BMI 25.0-29.9); Type 2 diabetes mellitus with sensory neuropathy (HCC); Fatty liver; CKD stage 2 due to type 1 diabetes mellitus (HCC); Environmental allergies; Situational depression; Neuropathy due to type 2 diabetes mellitus (HCC); Ophthalmic migraine; Hypokalemia; Arthritis of hand; Bilateral carpal tunnel syndrome; and Pain in right hand on their problem list.  Overall she reports feeling well today.  She has no new or additional cncerns to report in clinic at this time.  Sister passed due to metastatic kidney cancer in 2021. Brother was diagnosed with esophageal cancer but doing well with chemo/radiation.    Feels she is managing mood fairly, continues to see improvement.  She is taking melatonin 3 mg for sleep which is is working well.   Has had some occasional ophthalmic migraines improved, Rest and ibuprofen helps.   She has seen Dr. Ezzard Standing for her meniere's, she is taking HCTZ.  She did have a flare 1 month ago but was manageable.  Has no residual effects.   She has carpal tunnel bothering her more, has appointment to see Dr. Amanda Pea later this month. Wearing  bil braces without improvement.   BMI is Body mass index is 28.24 kg/m., she is working on diet and exercise Wt Readings from Last 3 Encounters:  08/02/23 139 lb 12.8 oz (63.4 kg)  04/24/23 131 lb 9.6 oz (59.7 kg)  01/12/23 135 lb (61.2 kg)   Her blood pressure has been controlled at home, today their BP is BP: (!) 142/70  She does workout, generally active. She denies chest pain, shortness of breath, dizziness.    She is on cholesterol medication (rosuvastatain 10 mg daily) and denies myalgias. Her cholesterol is at goal. The cholesterol last visit was:   Lab Results  Component Value Date   CHOL 139 04/24/2023   HDL 51 04/24/2023   LDLCALC 66 04/24/2023   TRIG 138 04/24/2023   CHOLHDL 2.7 04/24/2023    She was first diagnosed with DM 10-15 years ago, has been working on diet and exercise for diabetes with DM neuropathy (tingling, manages with topicals, podiatry Dr. Al Corpus following) and CKD II on ACEi, she has done a great job bringing down her A1C with weight loss/phentermine, she is on metformin, bASA, and denies polydipsia, polyuria and visual disturbances. Reports medication compliance. She does check fasting sugars occasionally, ranging 90-110 or so. Last A1C in the office was:  Lab Results  Component Value Date   HGBA1C 5.8 (H) 04/24/2023    CKD II associated with T2DM, monitored at this office, olmesartan. Last GFR:  Lab Results  Component Value Date   EGFR 92 04/24/2023   Lab Results  Component Value Date   MICRALBCREAT 17 09/07/2022   MICRALBCREAT 8 01/11/2022   MICRALBCREAT 39 (H) 09/07/2021   Patient is on Vitamin D supplement, reduced from 6000 IU to 4000 IU Lab Results  Component Value  Date   VD25OH 81 04/24/2023     She takes B12 supplement  Lab Results  Component Value Date   VITAMINB12 1,031 09/07/2021   Has been on slow release iron daily for many years, 50 fe.  Lab Results  Component Value Date   IRON 56 09/07/2021   TIBC 384 09/07/2021   FERRITIN  35 09/07/2021      Medication Review Current Outpatient Medications on File Prior to Visit  Medication Sig   aspirin 81 MG chewable tablet Chew 81 mg by mouth every other day.   BAYER MICROLET LANCETS lancets Use as instructed   bisoprolol-hydrochlorothiazide (ZIAC) 5-6.25 MG tablet TAKE 1 TABLET BY MOUTH EVERY DAY FOR BLOOD PRESSURE   Blood Glucose Monitoring Suppl (CONTOUR NEXT MONITOR) w/Device KIT USE TO CHECK BLOOD SUGAR DAILY   cetirizine (ZYRTEC) 10 MG tablet Take 10 mg by mouth daily. Alternates with Allegra   Cholecalciferol (VITAMIN D) 50 MCG (2000 UT) CAPS Take 2 capsules (4,000 Units total) by mouth daily. (Patient taking differently: Take 2,000 Units by mouth daily.)   ferrous sulfate dried (SLOW FE) 160 (50 FE) MG TBCR SR tablet Take 160 mg by mouth daily.   gabapentin (NEURONTIN) 100 MG capsule TAKE 1 CAPSULE BY MOUTH THREE TIMES A DAY   glucose blood (ONETOUCH ULTRA) test strip CHECK BLOOD SUGAR DAILY   hydrochlorothiazide (HYDRODIURIL) 25 MG tablet TAKE 1 TABLET DAILY FOR BLOOD PRESSURE & FLUID RETENTION (ANKLE SWELLING)   Magnesium 500 MG CAPS Take 500 mg by mouth daily.   meclizine (ANTIVERT) 25 MG tablet Take 1 tablet 3 x / day as needed for Dizziness   melatonin 3 MG TABS tablet Take 3 mg by mouth at bedtime.   metFORMIN (GLUCOPHAGE-XR) 500 MG 24 hr tablet TAKE 1 TABLET IN AM AND 2 TABS IN PM  WITH MEALS FOR DIABETES   olmesartan (BENICAR) 40 MG tablet Take  1 tablet  Daily  for BP               /TAKE ONE TABLET BY MOUTH DAILY FOR BLOOD PRESSURE   potassium chloride (KLOR-CON M) 10 MEQ tablet Take 1 tab daily for low potassium.   rosuvastatin (CRESTOR) 10 MG tablet TAKE 1 TABLET BY MOUTH EVERY DAY FOR CHOLESTEROL   vitamin B-12 (CYANOCOBALAMIN) 1000 MCG tablet Take 1,000 mcg by mouth daily.   ondansetron (ZOFRAN) 8 MG tablet Take 8 mg by mouth every 8 (eight) hours as needed. (Patient not taking: Reported on 09/07/2022)   promethazine (PHENERGAN) 25 MG tablet Take 1  tablet every 4 hours if needed for nausea or dizziness (Patient not taking: Reported on 08/02/2023)   No current facility-administered medications on file prior to visit.    Allergies: Allergies  Allergen Reactions   Penicillins Other (See Comments)   Lisinopril Cough   Sulfamethoxazole-Trimethoprim Nausea Only   Topamax [Topiramate] Other (See Comments)    Affected mood, lathargic    Current Problems (verified) has Hyperlipidemia associated with type 2 diabetes mellitus (HCC); Hypertension; Vitamin D deficiency; Meniere's disease; Medication management; Overweight (BMI 25.0-29.9); Type 2 diabetes mellitus with sensory neuropathy (HCC); Fatty liver; CKD stage 2 due to type 1 diabetes mellitus (HCC); Environmental allergies; Situational depression; Neuropathy due to type 2 diabetes mellitus (HCC); Ophthalmic migraine; Hypokalemia; Arthritis of hand; Bilateral carpal tunnel syndrome; and Pain in right hand on their problem list.  Screening Tests Immunization History  Administered Date(s) Administered   Fluad Quad(high Dose 65+) 08/27/2021   Influenza Split 07/16/2013, 07/24/2014  Influenza, High Dose Seasonal PF 06/15/2015, 07/04/2016, 07/14/2017, 06/19/2018, 06/03/2019, 09/14/2020   Influenza-Unspecified 07/07/2017, 06/06/2022   PFIZER Comirnaty(Gray Top)Covid-19 Tri-Sucrose Vaccine 03/26/2021   PFIZER(Purple Top)SARS-COV-2 Vaccination 11/09/2019, 12/03/2019, 08/21/2020   PPD Test 04/14/2014   Pfizer Covid-19 Vaccine Bivalent Booster 61yrs & up 08/27/2021   Pneumococcal Conjugate-13 10/31/2014   Pneumococcal Polysaccharide-23 12/01/2004, 03/23/2016   Tdap 04/06/2011   Zoster Recombinant(Shingrix) 06/06/2022, 08/07/2022   Zoster, Live 07/16/2013   Health Maintenance  Topic Date Due   OPHTHALMOLOGY EXAM  02/18/2021   DTaP/Tdap/Td (2 - Td or Tdap) 04/05/2021   FOOT EXAM  09/07/2022   INFLUENZA VACCINE  04/20/2023   COVID-19 Vaccine (6 - 2023-24 season) 05/21/2023   Diabetic  kidney evaluation - Urine ACR  09/08/2023   HEMOGLOBIN A1C  10/25/2023   Medicare Annual Wellness (AWV)  01/12/2024   MAMMOGRAM  02/10/2024   Diabetic kidney evaluation - eGFR measurement  04/23/2024   Fecal DNA (Cologuard)  09/28/2025   Pneumonia Vaccine 25+ Years old  Completed   DEXA SCAN  Completed   Hepatitis C Screening  Completed   Zoster Vaccines- Shingrix  Completed   HPV VACCINES  Aged Out   Colonoscopy  Discontinued   Names of Other Physician/Practitioners you currently use: 1. [facility]- here for primary care 2. [facility], Dr. Encarnacion Chu, eye doctor, last visit 04/2022, report requested, will be getting new vision provider this year 3. Dr. Renne Crigler, dentist, last visit 2023, goes q75m, looking for a new 4.   Dr. Al Corpus  - podiatry, neuropathy   Patient Care Team: Lucky Cowboy, MD as PCP - General (Internal Medicine) Louis Meckel, MD (Inactive) as Consulting Physician (Gastroenterology) Drema Halon, MD (Inactive) as Consulting Physician (Otolaryngology) Bunnie Pion, OD (Optometry) Francena Hanly, MD as Consulting Physician (Orthopedic Surgery) Elinor Parkinson, DPM as Consulting Physician (Podiatry)  Surgical: She  has a past surgical history that includes ORIF ankle fracture (Left, 2000); Abdominal hysterectomy (1998); and Shoulder surgery (Right, 02/2017). Family Her family history includes Alcoholism in her mother; Autism in her brother; Breast cancer (age of onset: 33) in her paternal aunt; COPD in her brother; Diabetes in her father; Esophageal cancer (age of onset: 17) in her brother; Heart disease in her father and son; Hypertension in her father; Kidney cancer (age of onset: 68) in her sister; Kidney disease in her son; Liver cancer in her maternal grandmother; Lung cancer in her brother; Pancreatic cancer in her mother. Social history  She reports that she has never smoked. She has never used smokeless  tobacco. She reports current alcohol use. She reports that she does not use drugs.  Review of Systems  Constitutional: Negative.  Negative for malaise/fatigue and weight loss.  HENT: Negative.  Negative for hearing loss and tinnitus (intermittent, bil, high pitched, nonpulsatile).   Eyes: Negative.  Negative for blurred vision and double vision. Eye redness: 2. Respiratory: Negative.  Negative for cough, sputum production, shortness of breath and wheezing.   Cardiovascular: Negative.  Negative for chest pain, palpitations, orthopnea, claudication, leg swelling and PND.  Gastrointestinal: Negative.  Negative for abdominal pain, blood in stool, constipation, diarrhea, heartburn, melena, nausea and vomiting.  Genitourinary: Negative.   Musculoskeletal: Negative.  Negative for falls, joint pain and myalgias.  Skin: Negative.  Negative for rash.  Neurological:  Positive for tingling (bil toes, stable). Negative for dizziness (intermittent with menier's, stable/improved), sensory change, weakness and headaches.  Endo/Heme/Allergies: Negative.  Negative for polydipsia.  Psychiatric/Behavioral: Negative.  Negative for depression, memory loss, substance abuse and suicidal ideas. The patient is not nervous/anxious and does not have insomnia.   All other systems reviewed and are negative.   Objective:   Today's Vitals   08/02/23 0928  BP: (!) 142/70  Pulse: 83  Temp: (!) 97.5 F (36.4 C)  SpO2: 99%  Weight: 139 lb 12.8 oz (63.4 kg)  Height: 4\' 11"  (1.499 m)   Body mass index is 28.24 kg/m.  General appearance: alert, no distress, WD/WN,  female HEENT: normocephalic, sclerae anicteric, TMs pearly, nares patent, no discharge or erythema, pharynx normal Oral cavity: MMM, no lesions Neck: supple, no lymphadenopathy, no thyromegaly, no masses Heart: RRR, normal S1, S2, no murmurs Lungs: CTA bilaterally, no wheezes, rhonchi, or rales Abdomen: +bs, soft, non tender, non distended, no masses, no  hepatomegaly, no splenomegaly Musculoskeletal: nontender, no swelling, no obvious deformity Extremities: no edema, no cyanosis, no clubbing Pulses: 2+ symmetric, upper and lower extremities, normal cap refill Neurological: alert, oriented x 3, CN2-12 intact, strength normal upper extremities and lower extremities, sensation mildly dulled to monofilament in toes, otherwise intact throughout, DTRs 2+ throughout, no cerebellar signs, gait normal Psychiatric: normal affect, behavior normal, pleasant   Sorin Frimpong, NP   08/02/2023

## 2023-08-03 LAB — CBC WITH DIFFERENTIAL/PLATELET
Absolute Lymphocytes: 2124 {cells}/uL (ref 850–3900)
Absolute Monocytes: 466 {cells}/uL (ref 200–950)
Basophils Absolute: 37 {cells}/uL (ref 0–200)
Basophils Relative: 0.5 %
Eosinophils Absolute: 170 {cells}/uL (ref 15–500)
Eosinophils Relative: 2.3 %
HCT: 37.6 % (ref 35.0–45.0)
Hemoglobin: 12.2 g/dL (ref 11.7–15.5)
MCH: 27.3 pg (ref 27.0–33.0)
MCHC: 32.4 g/dL (ref 32.0–36.0)
MCV: 84.1 fL (ref 80.0–100.0)
MPV: 10.4 fL (ref 7.5–12.5)
Monocytes Relative: 6.3 %
Neutro Abs: 4603 {cells}/uL (ref 1500–7800)
Neutrophils Relative %: 62.2 %
Platelets: 226 10*3/uL (ref 140–400)
RBC: 4.47 10*6/uL (ref 3.80–5.10)
RDW: 13.3 % (ref 11.0–15.0)
Total Lymphocyte: 28.7 %
WBC: 7.4 10*3/uL (ref 3.8–10.8)

## 2023-08-03 LAB — COMPLETE METABOLIC PANEL WITH GFR
AG Ratio: 1.6 (calc) (ref 1.0–2.5)
ALT: 10 U/L (ref 6–29)
AST: 15 U/L (ref 10–35)
Albumin: 4.4 g/dL (ref 3.6–5.1)
Alkaline phosphatase (APISO): 66 U/L (ref 37–153)
BUN: 12 mg/dL (ref 7–25)
CO2: 28 mmol/L (ref 20–32)
Calcium: 9.8 mg/dL (ref 8.6–10.4)
Chloride: 99 mmol/L (ref 98–110)
Creat: 0.62 mg/dL (ref 0.60–1.00)
Globulin: 2.8 g/dL (ref 1.9–3.7)
Glucose, Bld: 113 mg/dL — ABNORMAL HIGH (ref 65–99)
Potassium: 3.7 mmol/L (ref 3.5–5.3)
Sodium: 136 mmol/L (ref 135–146)
Total Bilirubin: 0.5 mg/dL (ref 0.2–1.2)
Total Protein: 7.2 g/dL (ref 6.1–8.1)
eGFR: 94 mL/min/{1.73_m2} (ref >=60)

## 2023-08-03 LAB — LIPID PANEL
Cholesterol: 130 mg/dL (ref <200)
HDL: 46 mg/dL — ABNORMAL LOW (ref >=50)
LDL Cholesterol (Calc): 61 mg/dL
Non-HDL Cholesterol (Calc): 84 mg/dL (ref <130)
Total CHOL/HDL Ratio: 2.8 (calc) (ref <5.0)
Triglycerides: 155 mg/dL — ABNORMAL HIGH (ref <150)

## 2023-08-03 LAB — HEMOGLOBIN A1C
Hgb A1c MFr Bld: 5.7 %{Hb} — ABNORMAL HIGH (ref <5.7)
Mean Plasma Glucose: 117 mg/dL
eAG (mmol/L): 6.5 mmol/L

## 2023-08-22 ENCOUNTER — Other Ambulatory Visit: Payer: Self-pay | Admitting: Nurse Practitioner

## 2023-08-22 DIAGNOSIS — I10 Essential (primary) hypertension: Secondary | ICD-10-CM

## 2023-08-28 ENCOUNTER — Other Ambulatory Visit: Payer: Self-pay | Admitting: Nurse Practitioner

## 2023-09-08 ENCOUNTER — Encounter: Payer: Self-pay | Admitting: Nurse Practitioner

## 2023-09-08 ENCOUNTER — Ambulatory Visit (INDEPENDENT_AMBULATORY_CARE_PROVIDER_SITE_OTHER): Payer: Medicare HMO | Admitting: Nurse Practitioner

## 2023-09-08 VITALS — BP 120/68 | HR 77 | Temp 98.1°F | Ht 59.5 in | Wt 128.6 lb

## 2023-09-08 DIAGNOSIS — E1169 Type 2 diabetes mellitus with other specified complication: Secondary | ICD-10-CM

## 2023-09-08 DIAGNOSIS — K760 Fatty (change of) liver, not elsewhere classified: Secondary | ICD-10-CM

## 2023-09-08 DIAGNOSIS — E1140 Type 2 diabetes mellitus with diabetic neuropathy, unspecified: Secondary | ICD-10-CM

## 2023-09-08 DIAGNOSIS — E559 Vitamin D deficiency, unspecified: Secondary | ICD-10-CM

## 2023-09-08 DIAGNOSIS — I10 Essential (primary) hypertension: Secondary | ICD-10-CM | POA: Diagnosis not present

## 2023-09-08 DIAGNOSIS — L308 Other specified dermatitis: Secondary | ICD-10-CM

## 2023-09-08 DIAGNOSIS — Z0000 Encounter for general adult medical examination without abnormal findings: Secondary | ICD-10-CM

## 2023-09-08 DIAGNOSIS — Z136 Encounter for screening for cardiovascular disorders: Secondary | ICD-10-CM | POA: Diagnosis not present

## 2023-09-08 DIAGNOSIS — G43109 Migraine with aura, not intractable, without status migrainosus: Secondary | ICD-10-CM

## 2023-09-08 DIAGNOSIS — Z0001 Encounter for general adult medical examination with abnormal findings: Secondary | ICD-10-CM

## 2023-09-08 DIAGNOSIS — E663 Overweight: Secondary | ICD-10-CM

## 2023-09-08 DIAGNOSIS — E1022 Type 1 diabetes mellitus with diabetic chronic kidney disease: Secondary | ICD-10-CM

## 2023-09-08 DIAGNOSIS — H8109 Meniere's disease, unspecified ear: Secondary | ICD-10-CM

## 2023-09-08 DIAGNOSIS — Z79899 Other long term (current) drug therapy: Secondary | ICD-10-CM

## 2023-09-08 DIAGNOSIS — Z9109 Other allergy status, other than to drugs and biological substances: Secondary | ICD-10-CM

## 2023-09-08 DIAGNOSIS — F4321 Adjustment disorder with depressed mood: Secondary | ICD-10-CM

## 2023-09-08 MED ORDER — TRIAMCINOLONE ACETONIDE 0.025 % EX OINT: 1.0000 | TOPICAL_OINTMENT | Freq: Two times a day (BID) | CUTANEOUS | 0 refills | Status: DC

## 2023-09-08 NOTE — Patient Instructions (Signed)
Eczema Eczema refers to a group of skin conditions that cause skin to become rough and inflamed. Each type of eczema has different triggers, symptoms, and treatments. Eczema of any type is usually itchy. Symptoms range from mild to severe. Eczema is not spread from person to person (is not contagious). It can appear on different parts of the body at different times. One person's eczema may look different from another person's eczema. What are the causes? The exact cause of this condition is not known. However, exposure to certain environmental factors, irritants, and allergens can make the condition worse. What are the signs or symptoms? Symptoms of this condition depend on the type of eczema you have. The types include: Contact dermatitis. There are two kinds: Irritant contact dermatitis. This happens when something irritates the skin and causes a rash. Allergic contact dermatitis. This happens when your skin comes in contact with something you are allergic to (allergens). This can include poison ivy, chemicals, or medicines that were applied to your skin. Atopic dermatitis. This is a long-term (chronic) skin disease that keeps coming back (recurring). It is the most common type of eczema. Usual symptoms are a red rash and itchy, dry, scaly skin. It usually starts showing signs in infancy and can last through adulthood. Dyshidrotic eczema. This is a form of eczema on the hands and feet. It shows up as very itchy, fluid-filled blisters. It can affect people of any age but is more common before age 40. Hand eczema. This causes very itchy areas of skin on the palms and sides of the hands and fingers. This type of eczema is common in industrial jobs where you may be exposed to different types of irritants. Lichen simplex chronicus. This type of eczema occurs when a person constantly scratches one area of the body. Repeated scratching of the area leads to thickened skin (lichenification). This condition can  accompany other types of eczema. It is more common in adults but may also be seen in children. Nummular eczema. This is a common type of eczema that most often affects the lower legs and the backs of the hands. It typically causes an itchy, red, circular, crusty lesion (plaque). Scratching may become a habit and can cause bleeding. Nummular eczema occurs most often in middle-aged or older people. Seborrheic dermatitis. This is a common skin disease that mainly affects the scalp. It may also affect other oily areas of the body, such as the face, sides of the nose, eyebrows, ears, eyelids, and chest. It is marked by small scaling and redness of the skin (erythema). This can affect people of all ages. In infants, this condition is called cradle cap. Stasis dermatitis. This is a common skin disease that can cause itching, scaling, and hyperpigmentation, usually on the legs and feet. It occurs most often in people who have a condition that prevents blood from being pumped through the veins in the legs (chronic venous insufficiency). Stasis dermatitis is a chronic condition that needs long-term management. How is this diagnosed? This condition may be diagnosed based on: A physical exam of your skin. Your medical history. Skin patch tests. These tests involve using patches that contain possible allergens and placing them on your back. Your health care provider will check in a few days to see if an allergic reaction occurred. How is this treated? Treatment for eczema is based on the type of eczema you have. You may be given hydrocortisone steroid medicine or antihistamines. These can relieve itching quickly and help reduce inflammation.   These may be prescribed or purchased over the counter, depending on the strength that is needed. Follow these instructions at home: Take or apply over-the-counter and prescription medicines only as told by your health care provider. Use creams or ointments to moisturize your  skin. Do not use lotions. Learn what triggers or irritates your symptoms so you can avoid these things. Treat symptom flare-ups quickly. Do not scratch your skin. This can make your rash worse. Keep all follow-up visits. This is important. Where to find more information American Academy of Dermatology: aad.org National Eczema Association: nationaleczema.org The Society for Pediatric Dermatology: pedsderm.net Contact a health care provider if: You have severe itching, even with treatment. You scratch your skin regularly until it bleeds. Your rash looks different than usual. Your skin is painful, swollen, or more red than usual. You have a fever. Summary Eczema refers to a group of skin conditions that cause skin to become rough and inflamed. Each type has different triggers. Eczema of any type causes itching that may range from mild to severe. Treatment varies based on the type of eczema you have. Hydrocortisone steroid medicine or antihistamines can help with itching and inflammation. Protecting your skin is the best way to prevent eczema. Use creams or ointments to moisturize your skin. Avoid triggers and irritants. Treat flare-ups quickly. This information is not intended to replace advice given to you by your health care provider. Make sure you discuss any questions you have with your health care provider. Document Revised: 06/12/2020 Document Reviewed: 06/15/2020 Elsevier Patient Education  2024 [employer]  

## 2023-09-08 NOTE — Progress Notes (Signed)
CPE  Assessment:   CPE Due annually  Health maintenance reviewed Healthily lifestyle goals set  Essential hypertension Discussed DASH (Dietary Approaches to Stop Hypertension) DASH diet is lower in sodium than a typical American diet. Cut back on foods that are high in saturated fat, cholesterol, and trans fats. Eat more whole-grain foods, fish, poultry, and nuts Remain active and exercise as tolerated daily.  Monitor BP at home-Call if greater than 130/80.  Check CMP/CBC  Type 2 diabetes mellitus with sensory neuropathy [facility]) Education: Reviewed 'ABCs' of diabetes management  Discussed goals to be met and/or maintained include A1C (<7) Blood pressure (<130/80) Cholesterol (LDL <70) Continue Eye Exam yearly  Continue Dental Exam Q6 mo Discussed dietary recommendations Discussed Physical Activity recommendations Check A1C  Hyperlipidemia associated with T2DM (HCC) Discussed lifestyle modifications. Recommended diet heavy in fruits and veggies, omega 3's. Decrease consumption of animal meats, cheeses, and dairy products. Remain active and exercise as tolerated. Continue to monitor. Check lipids/TSH  CKD 2 associated with T2DM (HCC) Discussed how what you eat and drink can aide in kidney protection. Stay well hydrated. Avoid high salt foods. Avoid NSAIDS. Keep BP and BG well controlled.   Take medications as prescribed. Remain active and exercise as tolerated daily. Maintain weight.  Continue to monitor. Check CMP/GFR/Microablumin  Neuropathy associated with T2DM (HCC) Control sugars, check feet daily.  Podiatry follows   Vitamin D deficiency Continue supplement for goal of 60-100 Monitor Vitamin D levels   Medication management All medications discussed and reviewed in full. All questions and concerns regarding medications addressed.    Meniere's disease, unspecified laterality Continue HCTZ, monitor kidney function Will refer to new ENT, Dr Ezzard Standing  retired  Overweight with comorbidities- BMI 28 Discussed appropriate BMI Diet modification. Physical activity. Encouraged/praised to build confidence.  Fatty liver Weight loss advised, avoid alcohol/tylenol, will monitor LFTs  Environmental allegies Continue OTC antihistamine, singulair Hygiene reviewed  Situational depression Declines meds, managing well with lifestyle   Ophthalmic migraine Occasional, stress related; doing well with conservative therapy - rest/ibuprofen  BLE Eczema Start Triamcinolone cream  Keep area free from moisture Continue to monitor  Orders Placed This Encounter  Procedures   CBC with Differential/Platelet   COMPLETE METABOLIC PANEL WITH GFR   Magnesium   Lipid panel   TSH   Hemoglobin A1c   Insulin, random   VITAMIN D 25 Hydroxy (Vit-D Deficiency, Fractures)   Urinalysis, Routine w reflex microscopic   Microalbumin / creatinine urine ratio   Vitamin B12   EKG 12-Lead   Notify office for further evaluation and treatment, questions or concerns if any reported s/s fail to improve.   The patient was advised to call back or seek an in-person evaluation if any symptoms worsen or if the condition fails to improve as anticipated.   Further disposition pending results of labs. Discussed med's effects and SE's.    I discussed the assessment and treatment plan with the patient. The patient was provided an opportunity to ask questions and all were answered. The patient agreed with the plan and demonstrated an understanding of the instructions.  Discussed med's effects and SE's. Screening labs and tests as requested with regular follow-up as recommended.  I provided 35 minutes of face-to-face time during this encounter including counseling, chart review, and critical decision making was preformed.  Today's Plan of Care is based on a patient-centered health care approach known as shared decision making - the decisions, tests and treatments allow for  patient preferences and values  to be balanced with clinical evidence.      Future Appointments  Date Time Provider Department Center  10/31/2023  9:15 AM [place], [place] [facility]  12/14/2023  9:30 AM Adela Glimpse, NP [facility] None  03/18/2024  9:30 AM Lucky Cowboy, MD [facility] None  06/18/2024  9:30 AM Adela Glimpse, NP [facility] None  09/25/2024 10:00 AM Lucky Cowboy, MD [facility] None   Subjective:   Regina Cervantes is a 72 y.o. female who presents for annual CPE and follow up. She has Hyperlipidemia associated with type 2 diabetes mellitus (HCC); Hypertension; Vitamin D deficiency; Meniere's disease; Medication management; Overweight (BMI 25.0-29.9); Type 2 diabetes mellitus with sensory neuropathy (HCC); Fatty liver; CKD stage 2 due to type 1 diabetes mellitus (HCC); Environmental allergies; Situational depression; Neuropathy due to type 2 diabetes mellitus (HCC); Ophthalmic migraine; Hypokalemia; Arthritis of hand; Bilateral carpal tunnel syndrome; and Pain in right hand on their problem list.  Overall she reports feeling well today.    She is married, 3 sons, youngest passed at 66 with ruptured pancreatic cyst, 3 grandchildren.   Reports BLE itching.  Has been using topical Eczema cream without relief.    Sister passed due to metastatic kidney cancer in 2021. Brother was diagnosed with esophageal cancer but doing well with chemo/radiation.    Feels she is managing mood fairly, continues to see improvement.  She is taking melatonin 3 mg for sleep which is is working well.   Has had some occasional ophthalmic migraines improved, Rest and ibuprofen helps.   She has seen Dr. Ezzard Standing for her meniere's, she is taking HCTZ.  She has carpal tunnel bothering her more, has appointment to see Dr. Amanda Pea later this month. Wearing bil braces without improvement.   BMI is Body mass index is 25.54 kg/m., she is working on diet and exercise Wt Readings from Last  3 Encounters:  09/08/23 128 lb 9.6 oz (58.3 kg)  08/02/23 139 lb 12.8 oz (63.4 kg)  04/24/23 131 lb 9.6 oz (59.7 kg)   Her blood pressure has been controlled at home, today their BP is BP: 120/68  She does workout, generally active. She denies chest pain, shortness of breath, dizziness.    She is on cholesterol medication (rosuvastatain 10 mg daily) and denies myalgias. Her cholesterol is at goal. The cholesterol last visit was:   Lab Results  Component Value Date   CHOL 130 08/02/2023   HDL 46 (L) 08/02/2023   LDLCALC 61 08/02/2023   TRIG 155 (H) 08/02/2023   CHOLHDL 2.8 08/02/2023    She was first diagnosed with DM 10-15 years ago, has been working on diet and exercise for diabetes with DM neuropathy (tingling, manages with topicals, podiatry Dr. Al Corpus following) and CKD II on ACEi, she has done a great job bringing down her A1C with weight loss/phentermine, she is on metformin, bASA, and denies polydipsia, polyuria and visual disturbances. Reports medication compliance. She does check fasting sugars occasionally, ranging 90-110 or so. Last A1C in the office was:  Lab Results  Component Value Date   HGBA1C 5.7 (H) 08/02/2023    CKD II associated with T2DM, monitored at this office, olmesartan. Last GFR:  Lab Results  Component Value Date   EGFR 94 08/02/2023   Lab Results  Component Value Date   MICRALBCREAT 17 09/07/2022   MICRALBCREAT 8 01/11/2022   MICRALBCREAT 39 (H) 09/07/2021   Patient is on Vitamin D supplement, reduced from 6000 IU to 4000 IU Lab Results  Component Value Date   VD25OH 84 04/24/2023     She takes B12 supplement  Lab Results  Component Value Date   VITAMINB12 1,031 09/07/2021   Has been on slow release iron daily for many years, 50 fe.  Lab Results  Component Value Date   IRON 56 09/07/2021   TIBC 384 09/07/2021   FERRITIN 35 09/07/2021      Medication Review Current Outpatient Medications on File Prior to Visit  Medication Sig    aspirin 81 MG chewable tablet Chew 81 mg by mouth every other day.   BAYER MICROLET LANCETS lancets Use as instructed   bisoprolol-hydrochlorothiazide (ZIAC) 5-6.25 MG tablet TAKE 1 TABLET BY MOUTH EVERY DAY FOR BLOOD PRESSURE   Blood Glucose Monitoring Suppl (CONTOUR NEXT MONITOR) w/Device KIT USE TO CHECK BLOOD SUGAR DAILY   cetirizine (ZYRTEC) 10 MG tablet Take 10 mg by mouth daily. Alternates with Allegra   Cholecalciferol (VITAMIN D) 50 MCG (2000 UT) CAPS Take 2 capsules (4,000 Units total) by mouth daily. (Patient taking differently: Take 2,000 Units by mouth daily.)   ferrous sulfate dried (SLOW FE) 160 (50 FE) MG TBCR SR tablet Take 160 mg by mouth daily.   gabapentin (NEURONTIN) 100 MG capsule TAKE 1 CAPSULE BY MOUTH THREE TIMES A DAY (Patient taking differently: 2 (two) times daily.)   glucose blood (ONETOUCH ULTRA) test strip CHECK BLOOD SUGAR DAILY   hydrochlorothiazide (HYDRODIURIL) 25 MG tablet TAKE 1 TABLET DAILY FOR BLOOD PRESSURE & FLUID RETENTION (ANKLE SWELLING)   Magnesium 500 MG CAPS Take 500 mg by mouth daily.   meclizine (ANTIVERT) 25 MG tablet Take 1 tablet 3 x / day as needed for Dizziness   melatonin 3 MG TABS tablet Take 3 mg by mouth at bedtime.   metFORMIN (GLUCOPHAGE-XR) 500 MG 24 hr tablet TAKE 1 TABLET IN AM AND 2 TABS IN PM  WITH MEALS FOR DIABETES   olmesartan (BENICAR) 40 MG tablet TAKE 1 TABLET BY MOUTH DAILY FOR BLOOD PRESSURE   ondansetron (ZOFRAN) 8 MG tablet Take 8 mg by mouth every 8 (eight) hours as needed.   potassium chloride (KLOR-CON M) 10 MEQ tablet Take 1 tab daily for low potassium.   rosuvastatin (CRESTOR) 10 MG tablet TAKE 1 TABLET BY MOUTH EVERY DAY FOR CHOLESTEROL   vitamin B-12 (CYANOCOBALAMIN) 1000 MCG tablet Take 1,000 mcg by mouth daily.   promethazine (PHENERGAN) 25 MG tablet Take 1 tablet every 4 hours if needed for nausea or dizziness (Patient not taking: Reported on 09/08/2023)   No current facility-administered medications on file  prior to visit.    Allergies: Allergies  Allergen Reactions   Penicillins Other (See Comments)   Lisinopril Cough   Sulfamethoxazole-Trimethoprim Nausea Only   Topamax [Topiramate] Other (See Comments)    Affected mood, lathargic    Current Problems (verified) has Hyperlipidemia associated with type 2 diabetes mellitus (HCC); Hypertension; Vitamin D deficiency; Meniere's disease; Medication management; Overweight (BMI 25.0-29.9); Type 2 diabetes mellitus with sensory neuropathy (HCC); Fatty liver; CKD stage 2 due to type 1 diabetes mellitus (HCC); Environmental allergies; Situational depression; Neuropathy due to type 2 diabetes mellitus (HCC); Ophthalmic migraine; Hypokalemia; Arthritis of hand; Bilateral carpal tunnel syndrome; and Pain in right hand on their problem list.  Screening Tests Immunization History  Administered Date(s) Administered   Fluad Quad(high Dose 65+) 08/27/2021   Influenza Split 07/16/2013, 07/24/2014   Influenza, High Dose Seasonal PF 06/15/2015, 07/04/2016, 07/14/2017, 06/19/2018, 06/03/2019, 09/14/2020, 08/02/2023   Influenza-Unspecified 07/07/2017, 06/06/2022  PFIZER Comirnaty(Gray Top)Covid-19 Tri-Sucrose Vaccine 03/26/2021   PFIZER(Purple Top)SARS-COV-2 Vaccination 11/09/2019, 12/03/2019, 08/21/2020   PPD Test 04/14/2014   Pfizer Covid-19 Vaccine Bivalent Booster 74yrs & up 08/27/2021   Pneumococcal Conjugate-13 10/31/2014   Pneumococcal Polysaccharide-23 12/01/2004, 03/23/2016   Tdap 04/06/2011   Zoster Recombinant(Shingrix) 06/06/2022, 08/07/2022   Zoster, Live 07/16/2013   Health Maintenance  Topic Date Due   OPHTHALMOLOGY EXAM  02/18/2021   DTaP/Tdap/Td (2 - Td or Tdap) 04/05/2021   FOOT EXAM  09/07/2022   COVID-19 Vaccine (6 - 2024-25 season) 05/21/2023   Diabetic kidney evaluation - Urine ACR  09/08/2023   Medicare Annual Wellness (AWV)  01/12/2024   HEMOGLOBIN A1C  01/30/2024   MAMMOGRAM  02/10/2024   Diabetic kidney evaluation - eGFR  measurement  08/01/2024   Fecal DNA (Cologuard)  09/28/2025   Pneumonia Vaccine 39+ Years old  Completed   INFLUENZA VACCINE  Completed   DEXA SCAN  Completed   Hepatitis C Screening  Completed   Zoster Vaccines- Shingrix  Completed   HPV VACCINES  Aged Out   Colonoscopy  Discontinued   Last colonoscopy: 2010 Last cologuard: 09/2021 Negative Due 2026 Last mammogram: 01/2022 - has apt scheduled for 01/2023 Last pap smear/pelvic exam: remote, hysterectomy, DONE DEXA: 02/2023 - normal Due  TD or Tdap: 2012, wants to boost PRN only  Shingles/Zostavax: 2014, will get shingrix at pharmacy  Covid 19: 2/2, boosted 08/2021.   Names of Other Physician/Practitioners you currently use: 1. [facility]- here for primary care 2. [facility], [facility], eye doctor, last visit 04/2023, report requested, will be getting new vision provider this year 3. Dr. Renne Crigler, dentist, last visit 2024 goes q13m, looking for a new  Dr. Al Corpus  - podiatry, neuropathy   Patient Care Team: Lucky Cowboy, MD as PCP - General (Internal Medicine) Louis Meckel, MD (Inactive) as Consulting Physician (Gastroenterology) Drema Halon, MD (Inactive) as Consulting Physician (Otolaryngology) Bunnie Pion, OD (Optometry) Francena Hanly, MD as Consulting Physician (Orthopedic Surgery) Elinor Parkinson, DPM as Consulting Physician (Podiatry)  Surgical: She  has a past surgical history that includes ORIF ankle fracture (Left, 2000); Abdominal hysterectomy (1998); and Shoulder surgery (Right, 02/2017). Family Her family history includes Alcoholism in her mother; Autism in her brother; Breast cancer (age of onset: 47) in her paternal aunt; COPD in her brother; Diabetes in her father; Esophageal cancer (age of onset: 55) in her brother; Heart disease in her father and son; Hypertension in her father; Kidney cancer (age of onset: 85) in her sister; Kidney disease in her son; Liver  cancer in her maternal grandmother; Lung cancer in her brother; Pancreatic cancer in her mother. Social history  She reports that she has never smoked. She has never used smokeless tobacco. She reports current alcohol use. She reports that she does not use drugs.  Review of Systems  Constitutional: Negative.  Negative for malaise/fatigue and weight loss.  HENT: Negative.  Negative for hearing loss and tinnitus (intermittent, bil, high pitched, nonpulsatile).   Eyes: Negative.  Negative for blurred vision and double vision. Eye redness: 2. Respiratory: Negative.  Negative for cough, sputum production, shortness of breath and wheezing.   Cardiovascular: Negative.  Negative for chest pain, palpitations, orthopnea, claudication, leg swelling and PND.  Gastrointestinal: Negative.  Negative for abdominal pain, blood in stool, constipation, diarrhea, heartburn, melena, nausea and vomiting.  Genitourinary: Negative.   Musculoskeletal: Negative.  Negative for falls, joint pain and myalgias.  Skin: Negative.  Negative for rash.  Neurological:  Positive for tingling (bil toes, stable). Negative for dizziness (intermittent with menier's, stable/improved), sensory change, weakness and headaches.  Endo/Heme/Allergies: Negative.  Negative for polydipsia.  Psychiatric/Behavioral: Negative.  Negative for depression, memory loss, substance abuse and suicidal ideas. The patient is not nervous/anxious and does not have insomnia.   All other systems reviewed and are negative.   Objective:   Today's Vitals   09/08/23 1011  BP: 120/68  Pulse: 77  Temp: 98.1 F (36.7 C)  SpO2: 99%  Weight: 128 lb 9.6 oz (58.3 kg)  Height: 4' 11.5" (1.511 m)    Body mass index is 25.54 kg/m.  General appearance: alert, no distress, WD/WN,  female HEENT: normocephalic, sclerae anicteric, TMs pearly, nares patent, no discharge or erythema, pharynx normal Oral cavity: MMM, no lesions Neck: supple, no lymphadenopathy, no  thyromegaly, no masses Heart: RRR, normal S1, S2, no murmurs Lungs: CTA bilaterally, no wheezes, rhonchi, or rales Abdomen: +bs, soft, non tender, non distended, no masses, no hepatomegaly, no splenomegaly Musculoskeletal: nontender, no swelling, no obvious deformity Extremities: no edema, no cyanosis, no clubbing Pulses: 2+ symmetric, upper and lower extremities, normal cap refill Neurological: alert, oriented x 3, CN2-12 intact, strength normal upper extremities and lower extremities, sensation mildly dulled to monofilament in toes, otherwise intact throughout, DTRs 2+ throughout, no cerebellar signs, gait normal Psychiatric: normal affect, behavior normal, pleasant   EKG:  NSR  Norwin Aleman, NP   09/08/2023

## 2023-09-11 ENCOUNTER — Other Ambulatory Visit: Payer: Self-pay | Admitting: Nurse Practitioner

## 2023-09-11 DIAGNOSIS — E1140 Type 2 diabetes mellitus with diabetic neuropathy, unspecified: Secondary | ICD-10-CM

## 2023-09-11 DIAGNOSIS — L308 Other specified dermatitis: Secondary | ICD-10-CM

## 2023-09-11 LAB — URINALYSIS, ROUTINE W REFLEX MICROSCOPIC
Bacteria, UA: NONE SEEN /HPF
Bilirubin Urine: NEGATIVE
Glucose, UA: NEGATIVE
Hgb urine dipstick: NEGATIVE
Hyaline Cast: NONE SEEN /LPF
Ketones, ur: NEGATIVE
Nitrite: NEGATIVE
Protein, ur: NEGATIVE
RBC / HPF: NONE SEEN /HPF (ref 0–2)
Specific Gravity, Urine: 1.011 (ref 1.001–1.035)
Squamous Epithelial / HPF: NONE SEEN /HPF (ref <=5)
WBC, UA: NONE SEEN /HPF (ref 0–5)
pH: 7.5 (ref 5.0–8.0)

## 2023-09-11 LAB — COMPLETE METABOLIC PANEL WITH GFR
AG Ratio: 1.6 (calc) (ref 1.0–2.5)
ALT: 11 U/L (ref 6–29)
AST: 16 U/L (ref 10–35)
Albumin: 4.6 g/dL (ref 3.6–5.1)
Alkaline phosphatase (APISO): 71 U/L (ref 37–153)
BUN: 12 mg/dL (ref 7–25)
CO2: 28 mmol/L (ref 20–32)
Calcium: 10.1 mg/dL (ref 8.6–10.4)
Chloride: 98 mmol/L (ref 98–110)
Creat: 0.65 mg/dL (ref 0.60–1.00)
Globulin: 2.9 g/dL (ref 1.9–3.7)
Glucose, Bld: 108 mg/dL — ABNORMAL HIGH (ref 65–99)
Potassium: 3.8 mmol/L (ref 3.5–5.3)
Sodium: 137 mmol/L (ref 135–146)
Total Bilirubin: 0.5 mg/dL (ref 0.2–1.2)
Total Protein: 7.5 g/dL (ref 6.1–8.1)
eGFR: 92 mL/min/{1.73_m2} (ref >=60)

## 2023-09-11 LAB — CBC WITH DIFFERENTIAL/PLATELET
Absolute Lymphocytes: 2362 {cells}/uL (ref 850–3900)
Absolute Monocytes: 506 {cells}/uL (ref 200–950)
Basophils Absolute: 47 {cells}/uL (ref 0–200)
Basophils Relative: 0.6 %
Eosinophils Absolute: 182 {cells}/uL (ref 15–500)
Eosinophils Relative: 2.3 %
HCT: 36.8 % (ref 35.0–45.0)
Hemoglobin: 12.1 g/dL (ref 11.7–15.5)
MCH: 27.6 pg (ref 27.0–33.0)
MCHC: 32.9 g/dL (ref 32.0–36.0)
MCV: 83.8 fL (ref 80.0–100.0)
MPV: 10.2 fL (ref 7.5–12.5)
Monocytes Relative: 6.4 %
Neutro Abs: 4803 {cells}/uL (ref 1500–7800)
Neutrophils Relative %: 60.8 %
Platelets: 260 10*3/uL (ref 140–400)
RBC: 4.39 10*6/uL (ref 3.80–5.10)
RDW: 13.1 % (ref 11.0–15.0)
Total Lymphocyte: 29.9 %
WBC: 7.9 10*3/uL (ref 3.8–10.8)

## 2023-09-11 LAB — HEMOGLOBIN A1C
Hgb A1c MFr Bld: 5.7 %{Hb} — ABNORMAL HIGH (ref <5.7)
Mean Plasma Glucose: 117 mg/dL
eAG (mmol/L): 6.5 mmol/L

## 2023-09-11 LAB — LIPID PANEL
Cholesterol: 151 mg/dL (ref <200)
HDL: 52 mg/dL (ref >=50)
LDL Cholesterol (Calc): 76 mg/dL
Non-HDL Cholesterol (Calc): 99 mg/dL (ref <130)
Total CHOL/HDL Ratio: 2.9 (calc) (ref <5.0)
Triglycerides: 151 mg/dL — ABNORMAL HIGH (ref <150)

## 2023-09-11 LAB — VITAMIN D 25 HYDROXY (VIT D DEFICIENCY, FRACTURES): Vit D, 25-Hydroxy: 80 ng/mL (ref 30–100)

## 2023-09-11 LAB — MAGNESIUM: Magnesium: 1.9 mg/dL (ref 1.5–2.5)

## 2023-09-11 LAB — INSULIN, RANDOM: Insulin: 7.8 u[IU]/mL

## 2023-09-11 LAB — MICROSCOPIC MESSAGE

## 2023-09-11 LAB — MICROALBUMIN / CREATININE URINE RATIO
Creatinine, Urine: 45 mg/dL (ref 20–275)
Microalb Creat Ratio: 13 mg/g{creat} (ref <30)
Microalb, Ur: 0.6 mg/dL

## 2023-09-11 LAB — VITAMIN B12: Vitamin B-12: 1115 pg/mL — ABNORMAL HIGH (ref 200–1100)

## 2023-09-11 LAB — TSH: TSH: 1.78 m[IU]/L (ref 0.40–4.50)

## 2023-10-07 ENCOUNTER — Other Ambulatory Visit: Payer: Self-pay | Admitting: Nurse Practitioner

## 2023-10-07 DIAGNOSIS — E876 Hypokalemia: Secondary | ICD-10-CM

## 2023-10-13 DIAGNOSIS — H11153 Pinguecula, bilateral: Secondary | ICD-10-CM | POA: Diagnosis not present

## 2023-10-13 DIAGNOSIS — H25012 Cortical age-related cataract, left eye: Secondary | ICD-10-CM | POA: Diagnosis not present

## 2023-10-13 DIAGNOSIS — Z7984 Long term (current) use of oral hypoglycemic drugs: Secondary | ICD-10-CM | POA: Diagnosis not present

## 2023-10-13 DIAGNOSIS — H2513 Age-related nuclear cataract, bilateral: Secondary | ICD-10-CM | POA: Diagnosis not present

## 2023-10-13 DIAGNOSIS — H40053 Ocular hypertension, bilateral: Secondary | ICD-10-CM | POA: Diagnosis not present

## 2023-10-13 DIAGNOSIS — E119 Type 2 diabetes mellitus without complications: Secondary | ICD-10-CM | POA: Diagnosis not present

## 2023-10-13 DIAGNOSIS — H5203 Hypermetropia, bilateral: Secondary | ICD-10-CM | POA: Diagnosis not present

## 2023-10-13 DIAGNOSIS — G43109 Migraine with aura, not intractable, without status migrainosus: Secondary | ICD-10-CM | POA: Diagnosis not present

## 2023-10-13 DIAGNOSIS — H40033 Anatomical narrow angle, bilateral: Secondary | ICD-10-CM | POA: Diagnosis not present

## 2023-10-31 ENCOUNTER — Ambulatory Visit (INDEPENDENT_AMBULATORY_CARE_PROVIDER_SITE_OTHER): Payer: Medicare HMO | Admitting: Podiatry

## 2023-10-31 ENCOUNTER — Encounter: Payer: Self-pay | Admitting: Podiatry

## 2023-10-31 DIAGNOSIS — M79676 Pain in unspecified toe(s): Secondary | ICD-10-CM

## 2023-10-31 DIAGNOSIS — D2372 Other benign neoplasm of skin of left lower limb, including hip: Secondary | ICD-10-CM | POA: Diagnosis not present

## 2023-10-31 DIAGNOSIS — B351 Tinea unguium: Secondary | ICD-10-CM | POA: Diagnosis not present

## 2023-10-31 DIAGNOSIS — D2371 Other benign neoplasm of skin of right lower limb, including hip: Secondary | ICD-10-CM | POA: Diagnosis not present

## 2023-10-31 NOTE — Progress Notes (Signed)
She presents today chief complaint of painful elongated toenails and callus to the plantar aspect of the left foot.  She also has a small callus medial aspect of the second digit at the PIPJ.  Objective: Vitals are stable alert oriented x 3.  Pulses are palpable.  Medial PIPJ of the second is right foot does demonstrate a mild reactive hyperkeratotic lesion left foot does demonstrate a benign skin lesion plantar aspect of the forefoot left.  No open lesions or wounds.  Toenails are long thick yellow dystrophic with mycotic tissue on the right side.  Assessment: Pain in limb secondary to onychomycosis benign skin lesions toes right forefoot left.  Plan: Debridement of benign skin lesions bilaterally debridement of toenails 1 through 5 bilateral.  Follow-up with her in 3 months

## 2023-11-09 ENCOUNTER — Other Ambulatory Visit: Payer: Self-pay

## 2023-11-09 DIAGNOSIS — E1140 Type 2 diabetes mellitus with diabetic neuropathy, unspecified: Secondary | ICD-10-CM

## 2023-11-09 MED ORDER — GABAPENTIN 100 MG PO CAPS
100.0000 mg | ORAL_CAPSULE | Freq: Three times a day (TID) | ORAL | 0 refills | Status: DC
Start: 2023-11-09 — End: 2024-01-11

## 2023-12-13 ENCOUNTER — Telehealth: Payer: Self-pay | Admitting: Internal Medicine

## 2023-12-13 NOTE — Telephone Encounter (Signed)
 Copied from CRM [id number]. Topic: General - Other >> Dec 13, 2023  2:14 PM Aletta Edouard wrote: Reason for CRM: patient is needing a call back regarding a appt to est care with dr plotnikvo patient stated her husband spoke with dr Janalyn Shy and was told patient could est care

## 2023-12-14 ENCOUNTER — Ambulatory Visit: Payer: Medicare HMO | Admitting: Nurse Practitioner

## 2023-12-21 NOTE — Telephone Encounter (Signed)
 Okay. Thank you.

## 2024-01-09 ENCOUNTER — Other Ambulatory Visit: Payer: Self-pay | Admitting: Internal Medicine

## 2024-01-09 DIAGNOSIS — Z1231 Encounter for screening mammogram for malignant neoplasm of breast: Secondary | ICD-10-CM

## 2024-01-11 ENCOUNTER — Other Ambulatory Visit: Payer: Self-pay | Admitting: Internal Medicine

## 2024-01-11 DIAGNOSIS — E1140 Type 2 diabetes mellitus with diabetic neuropathy, unspecified: Secondary | ICD-10-CM

## 2024-01-11 NOTE — Telephone Encounter (Unsigned)
 Copied from CRM [id number]. Topic: Clinical - Medication Refill >> Jan 11, 2024  2:36 PM [name] wrote: Most Recent Primary Care Visit:   Medication: gabapentin  (NEURONTIN ) 100 MG capsule  Has the patient contacted their pharmacy? Yes (Agent: If no, request that the patient contact the pharmacy for the refill. If patient does not wish to contact the pharmacy document the reason why and proceed with request.) (Agent: If yes, when and what did the pharmacy advise?)  Is this the correct pharmacy for this prescription? Yes If no, delete pharmacy and type the correct one.  This is the patient's preferred pharmacy:  [id number] - [place], [place] - [address] [place] [address] Phone: [phone] Fax: [phone]   Has the prescription been filled recently? No  Is the patient out of the medication? Yes  Has the patient been seen for an appointment in the last year OR does the patient have an upcoming appointment? Yes  Can we respond through MyChart? Yes  Agent: Please be advised that Rx refills may take up to 3 business days. We ask that you follow-up with your pharmacy.

## 2024-01-11 NOTE — Telephone Encounter (Signed)
 Copied from CRM [id number]. Topic: Clinical - Medication Refill >> Jan 11, 2024  2:37 PM [name] wrote: Most Recent Primary Care Visit:   Medication: gabapentin  (NEURONTIN ) 100 MG capsule  Has the patient contacted their pharmacy? Yes (Agent: If no, request that the patient contact the pharmacy for the refill. If patient does not wish to contact the pharmacy document the reason why and proceed with request.) (Agent: If yes, when and what did the pharmacy advise?)  Is this the correct pharmacy for this prescription? Yes If no, delete pharmacy and type the correct one.  This is the patient's preferred pharmacy:  [id number] - [place], [place] - [address] [place] [address] Phone: [phone] Fax: [phone]   Has the prescription been filled recently? No  Is the patient out of the medication? Yes  Has the patient been seen for an appointment in the last year OR does the patient have an upcoming appointment? Yes  Can we respond through MyChart? Yes  Agent: Please be advised that Rx refills may take up to 3 business days. We ask that you follow-up with your pharmacy.

## 2024-01-12 ENCOUNTER — Other Ambulatory Visit: Payer: Self-pay | Admitting: Family

## 2024-01-12 DIAGNOSIS — E1140 Type 2 diabetes mellitus with diabetic neuropathy, unspecified: Secondary | ICD-10-CM

## 2024-01-12 MED ORDER — GABAPENTIN 100 MG PO CAPS: 100.0000 mg | ORAL_CAPSULE | Freq: Three times a day (TID) | ORAL | 0 refills | Status: DC

## 2024-01-25 ENCOUNTER — Encounter: Payer: Self-pay | Admitting: Internal Medicine

## 2024-01-25 ENCOUNTER — Ambulatory Visit: Admitting: Internal Medicine

## 2024-01-25 VITALS — BP 118/82 | HR 76 | Temp 97.8°F | Ht 59.5 in | Wt 131.4 lb

## 2024-01-25 DIAGNOSIS — L308 Other specified dermatitis: Secondary | ICD-10-CM

## 2024-01-25 DIAGNOSIS — E1140 Type 2 diabetes mellitus with diabetic neuropathy, unspecified: Secondary | ICD-10-CM | POA: Diagnosis not present

## 2024-01-25 DIAGNOSIS — R634 Abnormal weight loss: Secondary | ICD-10-CM | POA: Diagnosis not present

## 2024-01-25 DIAGNOSIS — I10 Essential (primary) hypertension: Secondary | ICD-10-CM | POA: Diagnosis not present

## 2024-01-25 DIAGNOSIS — E559 Vitamin D deficiency, unspecified: Secondary | ICD-10-CM

## 2024-01-25 DIAGNOSIS — E785 Hyperlipidemia, unspecified: Secondary | ICD-10-CM

## 2024-01-25 DIAGNOSIS — N182 Chronic kidney disease, stage 2 (mild): Secondary | ICD-10-CM | POA: Diagnosis not present

## 2024-01-25 DIAGNOSIS — R202 Paresthesia of skin: Secondary | ICD-10-CM | POA: Diagnosis not present

## 2024-01-25 DIAGNOSIS — E1169 Type 2 diabetes mellitus with other specified complication: Secondary | ICD-10-CM

## 2024-01-25 DIAGNOSIS — E1022 Type 1 diabetes mellitus with diabetic chronic kidney disease: Secondary | ICD-10-CM

## 2024-01-25 LAB — LIPID PANEL
Cholesterol: 148 mg/dL (ref 0–200)
HDL: 50 mg/dL (ref >39.00)
LDL Cholesterol: 75 mg/dL (ref 0–99)
NonHDL: 98.26
Total CHOL/HDL Ratio: 3
Triglycerides: 114 mg/dL (ref 0.0–149.0)
VLDL: 22.8 mg/dL (ref 0.0–40.0)

## 2024-01-25 LAB — CBC WITH DIFFERENTIAL/PLATELET
Basophils Absolute: 0.1 10*3/uL (ref 0.0–0.1)
Basophils Relative: 0.6 % (ref 0.0–3.0)
Eosinophils Absolute: 0.3 10*3/uL (ref 0.0–0.7)
Eosinophils Relative: 3.5 % (ref 0.0–5.0)
HCT: 37.5 % (ref 36.0–46.0)
Hemoglobin: 12.5 g/dL (ref 12.0–15.0)
Lymphocytes Relative: 30.2 % (ref 12.0–46.0)
Lymphs Abs: 2.7 10*3/uL (ref 0.7–4.0)
MCHC: 33.5 g/dL (ref 30.0–36.0)
MCV: 82.7 fl (ref 78.0–100.0)
Monocytes Absolute: 0.6 10*3/uL (ref 0.1–1.0)
Monocytes Relative: 6.9 % (ref 3.0–12.0)
Neutro Abs: 5.3 10*3/uL (ref 1.4–7.7)
Neutrophils Relative %: 58.8 % (ref 43.0–77.0)
Platelets: 254 10*3/uL (ref 150.0–400.0)
RBC: 4.53 Mil/uL (ref 3.87–5.11)
RDW: 14.6 % (ref 11.5–15.5)
WBC: 9 10*3/uL (ref 4.0–10.5)

## 2024-01-25 LAB — URINALYSIS, ROUTINE W REFLEX MICROSCOPIC
Bilirubin Urine: NEGATIVE
Ketones, ur: NEGATIVE
Nitrite: NEGATIVE
Specific Gravity, Urine: 1.01 (ref 1.000–1.030)
Total Protein, Urine: NEGATIVE
Urine Glucose: NEGATIVE
Urobilinogen, UA: 0.2 (ref 0.0–1.0)
pH: 6 (ref 5.0–8.0)

## 2024-01-25 LAB — HEMOGLOBIN A1C: Hgb A1c MFr Bld: 5.8 % (ref 4.6–6.5)

## 2024-01-25 LAB — VITAMIN B12: Vitamin B-12: 1090 pg/mL — ABNORMAL HIGH (ref 211–911)

## 2024-01-25 LAB — COMPREHENSIVE METABOLIC PANEL WITH GFR
ALT: 13 U/L (ref 0–35)
AST: 18 U/L (ref 0–37)
Albumin: 4.7 g/dL (ref 3.5–5.2)
Alkaline Phosphatase: 65 U/L (ref 39–117)
BUN: 13 mg/dL (ref 6–23)
CO2: 28 meq/L (ref 19–32)
Calcium: 10.1 mg/dL (ref 8.4–10.5)
Chloride: 97 meq/L (ref 96–112)
Creatinine, Ser: 0.69 mg/dL (ref 0.40–1.20)
GFR: 85.46 mL/min (ref >60.00)
Glucose, Bld: 135 mg/dL — ABNORMAL HIGH (ref 70–99)
Potassium: 3.9 meq/L (ref 3.5–5.1)
Sodium: 133 meq/L — ABNORMAL LOW (ref 135–145)
Total Bilirubin: 0.5 mg/dL (ref 0.2–1.2)
Total Protein: 7.9 g/dL (ref 6.0–8.3)

## 2024-01-25 LAB — CK: Total CK: 86 U/L (ref 7–177)

## 2024-01-25 LAB — TSH: TSH: 1.92 u[IU]/mL (ref 0.35–5.50)

## 2024-01-25 LAB — T4, FREE: Free T4: 0.92 ng/dL (ref 0.60–1.60)

## 2024-01-25 MED ORDER — TRIAMCINOLONE ACETONIDE 0.025 % EX OINT: TOPICAL_OINTMENT | Freq: Three times a day (TID) | CUTANEOUS | 2 refills | Status: AC

## 2024-01-25 NOTE — Assessment & Plan Note (Signed)
 Hold Rosuvastatin  x 4 weeks (weak hands)

## 2024-01-25 NOTE — Progress Notes (Signed)
 Subjective:  Patient ID: Regina Cervantes, female    DOB: [birth year]  Age: 73 y.o. MRN: [id number]  CC: Establish Care (Establish Care. Patient notes some intermittent itching on calf muscles and forearm)   HPI Tiffiney D Tarabocchia presents for a new pt visit C/o cold hands and feet, OA, HTN, Meniere's, DM 2, dyslipidemia Pt lost wt from 190 to 135 lbs now C/o weaker grip, achy  Outpatient Medications Prior to Visit  Medication Sig Dispense Refill   aspirin 81 MG chewable tablet Chew 81 mg by mouth every other day.     BAYER MICROLET LANCETS lancets Use as instructed 100 each 12   bisoprolol -hydrochlorothiazide  (ZIAC ) 5-6.25 MG tablet TAKE 1 TABLET BY MOUTH EVERY DAY FOR BLOOD PRESSURE 90 tablet 4   Blood Glucose Monitoring Suppl (CONTOUR NEXT MONITOR) w/Device KIT USE TO CHECK BLOOD SUGAR DAILY 1 kit PRN   cetirizine (ZYRTEC) 10 MG tablet Take 10 mg by mouth daily. Alternates with Allegra     Cholecalciferol (VITAMIN D ) 50 MCG (2000 UT) CAPS Take 2 capsules (4,000 Units total) by mouth daily. (Patient taking differently: Take 2,000 Units by mouth daily.)     ferrous sulfate dried (SLOW FE) 160 (50 FE) MG TBCR SR tablet Take 160 mg by mouth daily.     gabapentin  (NEURONTIN ) 100 MG capsule Take 1 capsule (100 mg total) by mouth 3 (three) times daily. 90 capsule 0   glucose blood (ONETOUCH ULTRA) test strip CHECK BLOOD SUGAR DAILY 100 strip 3   hydrochlorothiazide  (HYDRODIURIL ) 25 MG tablet TAKE 1 TABLET DAILY FOR BLOOD PRESSURE & FLUID RETENTION (ANKLE SWELLING) 90 tablet 3   Magnesium 500 MG CAPS Take 500 mg by mouth daily.     meclizine  (ANTIVERT ) 25 MG tablet Take 1 tablet 3 x / day as needed for Dizziness 30 tablet 2   melatonin 3 MG TABS tablet Take 3 mg by mouth at bedtime.     metFORMIN  (GLUCOPHAGE -XR) 500 MG 24 hr tablet TAKE 1 TABLET IN AM AND 2 TABS IN PM  WITH MEALS FOR DIABETES 270 tablet 1   olmesartan  (BENICAR ) 40 MG tablet TAKE 1 TABLET BY MOUTH DAILY FOR BLOOD PRESSURE 90  tablet 3   ondansetron (ZOFRAN) 8 MG tablet Take 8 mg by mouth every 8 (eight) hours as needed.     potassium chloride  (KLOR-CON  M10) 10 MEQ tablet TAKE 1 TAB DAILY FOR LOW POTASSIUM. 90 tablet 3   rosuvastatin  (CRESTOR ) 10 MG tablet TAKE 1 TABLET BY MOUTH EVERY DAY FOR CHOLESTEROL 90 tablet 3   vitamin B-12 (CYANOCOBALAMIN) 1000 MCG tablet Take 1,000 mcg by mouth daily.     promethazine  (PHENERGAN ) 25 MG tablet Take 1 tablet every 4 hours if needed for nausea or dizziness (Patient not taking: Reported on 01/25/2024) 30 tablet 2   triamcinolone  (KENALOG ) 0.025 % ointment APPLY TO AFFECTED AREA TWICE A DAY (Patient not taking: Reported on 01/25/2024) 30 g 0   No facility-administered medications prior to visit.    ROS: Review of Systems  Constitutional:  Negative for activity change, appetite change, chills, fatigue and unexpected weight change.  HENT:  Negative for congestion, mouth sores and sinus pressure.   Eyes:  Negative for visual disturbance.  Respiratory:  Negative for cough and chest tightness.   Gastrointestinal:  Negative for abdominal pain and nausea.  Genitourinary:  Negative for difficulty urinating, frequency and vaginal pain.  Musculoskeletal:  Negative for back pain and gait problem.  Skin:  Negative for pallor and  rash.  Neurological:  Negative for dizziness, tremors, weakness, numbness and headaches.  Psychiatric/Behavioral:  Negative for confusion and sleep disturbance.     Objective:  BP 118/82   Pulse 76   Temp 97.8 F (36.6 C)   Ht 4' 11.5" (1.511 m)   Wt 131 lb 6.4 oz (59.6 kg)   SpO2 96%   BMI 26.10 kg/m   BP Readings from Last 3 Encounters:  01/25/24 118/82  09/08/23 120/68  08/02/23 (!) 142/70    Wt Readings from Last 3 Encounters:  01/25/24 131 lb 6.4 oz (59.6 kg)  09/08/23 128 lb 9.6 oz (58.3 kg)  08/02/23 139 lb 12.8 oz (63.4 kg)    Physical Exam Constitutional:      General: She is not in acute distress.    Appearance: She is  well-developed. She is not ill-appearing or diaphoretic.  HENT:     Head: Normocephalic.     Right Ear: External ear normal.     Left Ear: External ear normal.     Nose: Nose normal.  Eyes:     General:        Right eye: No discharge.        Left eye: No discharge.     Conjunctiva/sclera: Conjunctivae normal.     Pupils: Pupils are equal, round, and reactive to light.  Neck:     Thyroid : No thyromegaly.     Vascular: No JVD.     Trachea: No tracheal deviation.  Cardiovascular:     Rate and Rhythm: Normal rate and regular rhythm.     Heart sounds: Normal heart sounds.  Pulmonary:     Effort: No respiratory distress.     Breath sounds: No stridor. No wheezing.  Abdominal:     General: Bowel sounds are normal. There is no distension.     Palpations: Abdomen is soft. There is no mass.     Tenderness: There is no abdominal tenderness. There is no guarding or rebound.  Musculoskeletal:        General: No tenderness.     Cervical back: Normal range of motion and neck supple. No rigidity.  Lymphadenopathy:     Cervical: No cervical adenopathy.  Skin:    Findings: No erythema or rash.  Neurological:     Cranial Nerves: No cranial nerve deficit.     Motor: No abnormal muscle tone.     Coordination: Coordination normal.     Deep Tendon Reflexes: Reflexes normal.  Psychiatric:        Behavior: Behavior normal.        Thought Content: Thought content normal.        Judgment: Judgment normal.     Lab Results  Component Value Date   WBC 7.9 09/08/2023   HGB 12.1 09/08/2023   HCT 36.8 09/08/2023   PLT 260 09/08/2023   GLUCOSE 108 (H) 09/08/2023   CHOL 151 09/08/2023   TRIG 151 (H) 09/08/2023   HDL 52 09/08/2023   LDLCALC 76 09/08/2023   ALT 11 09/08/2023   AST 16 09/08/2023   NA 137 09/08/2023   K 3.8 09/08/2023   CL 98 09/08/2023   CREATININE 0.65 09/08/2023   BUN 12 09/08/2023   CO2 28 09/08/2023   TSH 1.78 09/08/2023   HGBA1C 5.7 (H) 09/08/2023   MICROALBUR 0.6  09/08/2023    DG Bone Density Result Date: 02/28/2023 EXAM: DUAL X-RAY ABSORPTIOMETRY (DXA) FOR BONE MINERAL DENSITY IMPRESSION: Referring Physician:  Langley Pippin Your patient completed a  bone mineral density test using GE Lunar iDXA system (analysis version: 16). Technologist:    lmn PATIENT: Name: Katanna, Rembert Patient ID: [id number] Birth Date: [birth year] Height: 58.5 in. Sex: Female Measured: 02/28/2023 Weight: 131.8 lbs. Indications: Advanced Age, Caucasian, Chronic Kidney Disease, Estrogen Deficient, Gabapentin , History of Fracture (Adult) (V15.51), Hysterectomy, One ovary removed, osteoarthritis, Postmenopausal, Vitamin D  Deficient Fractures: Left Foot Treatments: Vitamin D  (E933.5) ASSESSMENT: The BMD measured at Forearm Radius 33% is 0.779 g/cm2 with a T-score of -1.1. This patient is considered osteopenic/low bone mass according to World Health Organization [facility]) criteria. The quality of the exam is good. The lumbar spine was excluded due to degenerative changes as noted on previous exam. Site Region Measured Date Measured Age YA BMD Significant CHANGE T-score Left Forearm Radius 33% 02/28/2023 73.5 -1.1 0.779 g/cm2 Left Forearm Radius 33% 08/14/2018 69.0 -0.7 0.825 g/cm2 DualFemur Neck Right 02/28/2023 73.5 -0.7 0.946 g/cm2 * DualFemur Neck Right 08/14/2018 69.0 1.7 1.279 g/cm2 DualFemur Total Mean 02/28/2023 73.5 -0.1 0.993 g/cm2 * DualFemur Total Mean 08/14/2018 69.0 0.7 1.102 g/cm2 World Health Organization [facility]) criteria for post-menopausal, Caucasian Women: Normal       T-score at or above -1 SD Osteopenia   T-score between -1 and -2.5 SD Osteoporosis T-score at or below -2.5 SD RECOMMENDATION: 1. All patients should optimize calcium  and vitamin D  intake. 2. Consider FDA-approved medical therapies in postmenopausal women and men aged 29 years and older, based on the following: a. A hip or vertebral (clinical or morphometric) fracture. b. T-score = -2.5 at the femoral neck or spine after  appropriate evaluation to exclude secondary causes. c. Low bone mass (T-score between -1.0 and -2.5 at the femoral neck or spine) and a 10-year probability of a hip fracture = 3% or a 10-year probability of a major osteoporosis-related fracture = 20% based on the [place] -adapted WHO algorithm. d. Clinician judgment and/or patient preferences may indicate treatment for people with 10-year fracture probabilities above or below these levels. FOLLOW-UP: Patients with diagnosis of osteoporosis or at high risk for fracture should have regular bone mineral density tests. Patients eligible for Medicare are allowed routine testing every 2 years. The testing frequency can be increased to one year for patients who have rapidly progressing disease, are receiving or discontinuing medical therapy to restore bone mass, or have additional risk factors. I have reviewed this study and agree with the findings. [facility] FRAX* 10-year Probability of Fracture Based on femoral neck BMD: DualFemur (Right) Major Osteoporotic Fracture: 13.6% Hip Fracture:                1.4% Population:                  [place]  (Caucasian) Risk Factors:                History of Fracture (Adult) (V15.51) *FRAX is a Armed forces logistics/support/administrative officer of the [employer] of [employer] for Metabolic Bone Disease, a World Science writer (WHO) [employer]. ASSESSMENT: The probability of a major osteoporotic fracture is 13.6% within the next ten years. The probability of a hip fracture is 1.4% within the next ten years. Electronically Signed   By: Alinda Apley M.D.   On: 02/28/2023 13:32    Assessment & Plan:   Problem List Items Addressed This Visit     Hyperlipidemia associated with type 2 diabetes mellitus (HCC)   Hold Rosuvastatin  x 4 weeks (weak hands)      Hypertension   D/c Ziac   HCT Low BP      Relevant Orders   TSH   Urinalysis   CBC with Differential/Platelet   Vitamin D  deficiency   On Vit D      Type 2  diabetes mellitus with sensory neuropathy (HCC)   Check A1c      Relevant Orders   Lipid panel   Hemoglobin A1c   CKD stage 2 due to type 1 diabetes mellitus (HCC)   Hydrate well      Relevant Orders   CBC with Differential/Platelet   Weight loss - Primary   Pt lost wt from 190 to 135 lbs now on diet and a wt loss medication      Relevant Orders   TSH   Urinalysis   CBC with Differential/Platelet   Lipid panel   Comprehensive metabolic panel with GFR   CK   Hemoglobin A1c   Vitamin B12   T4, free   Other Visit Diagnoses       Other eczema       Relevant Medications   triamcinolone  (KENALOG ) 0.025 % ointment     Paresthesia       Relevant Orders   CBC with Differential/Platelet   CK   Vitamin B12         Meds ordered this encounter  Medications   triamcinolone  (KENALOG ) 0.025 % ointment    Sig: Apply topically 3 (three) times daily.    Dispense:  60 g    Refill:  2      Follow-up: Return in about 2 months (around 03/26/2024) for a follow-up visit.  Anitra Barn, MD

## 2024-01-25 NOTE — Assessment & Plan Note (Signed)
 Check A1c.

## 2024-01-25 NOTE — Assessment & Plan Note (Signed)
 Pt lost wt from 190 to 135 lbs now on diet and a wt loss medication

## 2024-01-25 NOTE — Assessment & Plan Note (Signed)
 Hydrate well

## 2024-01-25 NOTE — Assessment & Plan Note (Signed)
 On Vit D

## 2024-01-25 NOTE — Assessment & Plan Note (Signed)
 D/c Ziac  HCT Low BP

## 2024-01-25 NOTE — Patient Instructions (Addendum)
 Sauna Blanket, Sauna, Portable Sauna for Home, 86-158?, 20-60 Minutes Timer, 6 ft x 2.62 ft Visit the [employer] 4.3 4.3 out of 5 stars    960 ratings 300+ bought in past month Limited time deal $89.98 with 36 percent savings-36% $89.98  Hold Rosuvastatin  x 4 weeks (weak hands)   Cardiac CT calcium  scoring test $99    Computed tomography, more commonly known as a CT or CAT scan, is a diagnostic medical imaging test. Like traditional x-rays, it produces multiple images or pictures of the inside of the body. The cross-sectional images generated during a CT scan can be reformatted in multiple planes. They can even generate three-dimensional images. These images can be viewed on a computer monitor. CT images of internal organs, bones, soft tissue and blood vessels provide greater detail than traditional x-rays, particularly of soft tissues and blood vessels. A cardiac CT scan for coronary calcium  is a non-invasive way of obtaining information about the presence, location and extent of calcified plaque in the coronary arteries--the vessels that supply oxygen-containing blood to the heart muscle. Calcified plaque results when there is a build-up of fat and other substances under the inner layer of the artery. This material can calcify which signals the presence of atherosclerosis, a disease of the vessel wall, also called coronary artery disease (CAD). People with this disease have an increased risk for heart attacks. In addition, over time, progression of plaque build up (CAD) can narrow the arteries or even close off blood flow to the heart. The result may be chest pain, sometimes called "angina," or a heart attack. Because calcium  is a marker of CAD, the amount of calcium  detected on a cardiac CT scan is a helpful prognostic tool. The findings on cardiac CT are expressed as a calcium  score. Another name for this test is coronary artery calcium  scoring.  What are some common uses of the  procedure? The goal of cardiac CT scan for calcium  scoring is to determine if CAD is present and to what extent, even if there are no symptoms. It is a screening study that may be recommended by a physician for patients with risk factors for CAD but no clinical symptoms. The major risk factors for CAD are: high blood cholesterol levels  family history of heart attacks  diabetes  high blood pressure  cigarette smoking  overweight or obese  physical inactivity   A negative cardiac CT scan for calcium  scoring shows no calcification within the coronary arteries. This suggests that CAD is absent or so minimal it cannot be seen by this technique. The chance of having a heart attack over the next two to five years is very low under these circumstances. A positive test means that CAD is present, regardless of whether or not the patient is experiencing any symptoms. The amount of calcification--expressed as the calcium  score--may help to predict the likelihood of a myocardial infarction (heart attack) in the coming years and helps your medical doctor or cardiologist decide whether the patient may need to take preventive medicine or undertake other measures such as diet and exercise to lower the risk for heart attack. The extent of CAD is graded according to your calcium  score:  Calcium  Score  Presence of CAD (coronary artery disease)  0 No evidence of CAD   1-10 Minimal evidence of CAD  11-100 Mild evidence of CAD  101-400 Moderate evidence of CAD  Over 400 Extensive evidence of CAD   Coronary artery calcium  (CAC) score is a strong  predictor of incident coronary heart disease (CHD) and provides predictive information beyond traditional risk factors. CAC scoring is reasonable to use in the decision to withhold, postpone, or initiate statin therapy in intermediate-risk or selected borderline-risk asymptomatic adults (age 76-75 years and LDL-C >=70 to <190 mg/dL) who do not have diabetes or  established atherosclerotic cardiovascular disease (ASCVD).* In intermediate-risk (10-year ASCVD risk >=7.5% to <20%) adults or selected borderline-risk (10-year ASCVD risk >=5% to <7.5%) adults in whom a CAC score is measured for the purpose of making a treatment decision the following recommendations have been made:   If CAC=0, it is reasonable to withhold statin therapy and reassess in 5 to 10 years, as long as higher risk conditions are absent (diabetes mellitus, family history of premature CHD in first degree relatives (males <55 years; females <65 years), cigarette smoking, or LDL >=190 mg/dL).   If CAC is 1 to 99, it is reasonable to initiate statin therapy for patients >=62 years of age.   If CAC is >=100 or >=75th percentile, it is reasonable to initiate statin therapy at any age.   Cardiology referral should be considered for patients with CAC scores >=400 or >=75th percentile.   *2018 AHA/ACC/AACVPR/AAPA/ABC/ACPM/ADA/AGS/APhA/ASPC/NLA/PCNA Guideline on the Management of Blood Cholesterol: A Report of the American College of Cardiology/American Heart Association Task Force on Clinical Practice Guidelines. J Am Coll Cardiol. 2019;73(24):3168-3209.

## 2024-01-30 ENCOUNTER — Ambulatory Visit: Payer: Medicare HMO | Admitting: Podiatry

## 2024-01-30 ENCOUNTER — Encounter: Payer: Self-pay | Admitting: Podiatry

## 2024-01-30 DIAGNOSIS — B351 Tinea unguium: Secondary | ICD-10-CM | POA: Diagnosis not present

## 2024-01-30 DIAGNOSIS — M79676 Pain in unspecified toe(s): Secondary | ICD-10-CM

## 2024-01-30 DIAGNOSIS — D2372 Other benign neoplasm of skin of left lower limb, including hip: Secondary | ICD-10-CM

## 2024-01-30 DIAGNOSIS — D2371 Other benign neoplasm of skin of right lower limb, including hip: Secondary | ICD-10-CM

## 2024-01-30 NOTE — Progress Notes (Signed)
 She presents today chief complaint of painful elongated toenails and callus to the plantar aspect of the left foot.  She also has a small callus medial aspect of the second digit at the PIPJ.  Objective: Vitals are stable alert oriented x 3.  Pulses are palpable.  Medial PIPJ of the second is right foot does demonstrate a mild reactive hyperkeratotic lesion left foot does demonstrate a benign skin lesion plantar aspect of the forefoot left.  No open lesions or wounds.  Toenails are long thick yellow dystrophic with mycotic tissue on the right side.  Assessment: Pain in limb secondary to onychomycosis benign skin lesions toes right forefoot left.  Plan: Debridement of benign skin lesions bilaterally debridement of toenails 1 through 5 bilateral.  Follow-up with her in 3 months

## 2024-02-13 ENCOUNTER — Ambulatory Visit
Admission: RE | Admit: 2024-02-13 | Discharge: 2024-02-13 | Disposition: A | Discharge status: Home / Self Care | Source: Ambulatory Visit | Attending: Internal Medicine | Admitting: Internal Medicine

## 2024-02-13 DIAGNOSIS — Z1231 Encounter for screening mammogram for malignant neoplasm of breast: Secondary | ICD-10-CM | POA: Diagnosis not present

## 2024-03-02 ENCOUNTER — Telehealth: Payer: Self-pay | Admitting: Family

## 2024-03-02 DIAGNOSIS — E1140 Type 2 diabetes mellitus with diabetic neuropathy, unspecified: Secondary | ICD-10-CM

## 2024-03-07 NOTE — Telephone Encounter (Signed)
 Copied from CRM [id number]. Topic: Clinical - Prescription Issue >> Mar 07, 2024  2:59 PM Kita Perish H wrote: Reason for CRM: Patient is calling to check the status of her refill request for the gabapentin  (NEURONTIN ) 100 MG capsule, refill pending since 6/14.  Donato Fu [phone]

## 2024-03-08 ENCOUNTER — Ambulatory Visit: Payer: Medicare HMO | Admitting: Nurse Practitioner

## 2024-03-08 NOTE — Telephone Encounter (Signed)
 Done. Thanks.

## 2024-03-18 ENCOUNTER — Ambulatory Visit: Payer: Medicare HMO | Admitting: Internal Medicine

## 2024-03-25 ENCOUNTER — Ambulatory Visit (INDEPENDENT_AMBULATORY_CARE_PROVIDER_SITE_OTHER)

## 2024-03-25 VITALS — Ht 59.5 in | Wt 131.0 lb

## 2024-03-25 DIAGNOSIS — Z0000 Encounter for general adult medical examination without abnormal findings: Secondary | ICD-10-CM | POA: Diagnosis not present

## 2024-03-25 NOTE — Patient Instructions (Signed)
 Regina Cervantes , Thank you for taking time out of your busy schedule to complete your Annual Wellness Visit with me. I enjoyed our conversation and look forward to speaking with you again next year. I, as well as your care team,  appreciate your ongoing commitment to your health goals. Please review the following plan we discussed and let me know if I can assist you in the future. Your Game plan/ To Do List   Follow up Visits: Next Medicare AWV with our clinical staff: 03/26/2025.  Patient prefers telephone visit.   Have you seen your provider in the last 6 months (3 months if uncontrolled diabetes)? Yes Next Office Visit with your provider: 03/27/2024.  Clinician Recommendations:  Aim for 30 minutes of exercise or brisk walking, 6-8 glasses of water, and 5 servings of fruits and vegetables each day. You are due for a foot exam and a tetanus vaccine.  You will get these during your up coming visit with Dr. Garald.        This is a list of the screening recommended for you and due dates:  Health Maintenance  Topic Date Due   Eye exam for diabetics  02/18/2021   DTaP/Tdap/Td vaccine (2 - Td or Tdap) 04/05/2021   Complete foot exam   09/07/2022   COVID-19 Vaccine (6 - 2024-25 season) 05/21/2023   Flu Shot  04/19/2024   Hemoglobin A1C  07/27/2024   Yearly kidney health urinalysis for diabetes  09/07/2024   Yearly kidney function blood test for diabetes  01/24/2025   Mammogram  02/12/2025   Medicare Annual Wellness Visit  03/25/2025   Cologuard (Stool DNA test)  09/28/2025   Pneumococcal Vaccine for age over 31  Completed   DEXA scan (bone density measurement)  Completed   Hepatitis C Screening  Completed   Zoster (Shingles) Vaccine  Completed   Hepatitis B Vaccine  Aged Out   HPV Vaccine  Aged Out   Meningitis B Vaccine  Aged Out   Colon Cancer Screening  Discontinued    Advanced directives: (In Chart) A copy of your advanced directives are scanned into your chart should your provider  ever need it. Advance Care Planning is important because it:  [x]  Makes sure you receive the medical care that is consistent with your values, goals, and preferences  [x]  It provides guidance to your family and loved ones and reduces their decisional burden about whether or not they are making the right decisions based on your wishes.  Follow the link provided in your after visit summary or read over the paperwork we have mailed to you to help you started getting your Advance Directives in place. If you need assistance in completing these, please reach out to us  so that we can help you!  See attachments for Preventive Care and Fall Prevention Tips.

## 2024-03-25 NOTE — Progress Notes (Cosign Needed Addendum)
 Subjective:   TAYLAR HARTSOUGH is a 73 y.o. who presents for a Medicare Wellness preventive visit.  As a reminder, Annual Wellness Visits don't include a physical exam, and some assessments may be limited, especially if this visit is performed virtually. We may recommend an in-person follow-up visit with your provider if needed.  Visit Complete: Virtual I connected with  Dominik D Moldovan on 03/25/24 by a audio enabled telemedicine application and verified that I am speaking with the correct person using two identifiers.  Patient Location: Home  Provider Location: Office/Clinic  I discussed the limitations of evaluation and management by telemedicine. The patient expressed understanding and agreed to proceed.  Vital Signs: Because this visit was a virtual/telehealth visit, some criteria may be missing or patient reported. Any vitals not documented were not able to be obtained and vitals that have been documented are patient reported.  VideoDeclined- This patient declined Librarian, academic. Therefore the visit was completed with audio only.  Persons Participating in Visit: Patient.  AWV Questionnaire: No: Patient Medicare AWV questionnaire was not completed prior to this visit.  Cardiac Risk Factors include: advanced age (>66men, >79 women);diabetes mellitus;dyslipidemia;hypertension;Other (see comment), Risk factor comments: CKD stage2     Objective:    Today's Vitals   03/25/24 1014  Weight: 131 lb (59.4 kg)  Height: 4' 11.5 (1.511 m)   Body mass index is 26.02 kg/m.     03/25/2024   10:22 AM 01/12/2023   10:20 AM 01/11/2022    9:49 AM 01/11/2021    9:53 AM 06/08/2020    9:27 AM 06/03/2019    9:10 AM 05/23/2018    9:15 AM  Advanced Directives  Does Patient Have a Medical Advance Directive? Yes Yes Yes No No No No   Type of Estate agent of [place];Living will Living will Healthcare Power of Attorney      Does patient want  to make changes to medical advance directive? No - Patient declined  No - Patient declined      Copy of Healthcare Power of Attorney in Chart? Yes - validated most recent copy scanned in chart (See row information)  Yes - validated most recent copy scanned in chart (See row information)      Would patient like information on creating a medical advance directive?    No - Patient declined No - Patient declined No - Patient declined No - Patient declined      Data saved with a previous flowsheet row definition    Current Medications (verified) Outpatient Encounter Medications as of 03/25/2024  Medication Sig   aspirin 81 MG chewable tablet Chew 81 mg by mouth every other day.   BAYER MICROLET LANCETS lancets Use as instructed   bisoprolol -hydrochlorothiazide  (ZIAC ) 5-6.25 MG tablet TAKE 1 TABLET BY MOUTH EVERY DAY FOR BLOOD PRESSURE   Blood Glucose Monitoring Suppl (CONTOUR NEXT MONITOR) w/Device KIT USE TO CHECK BLOOD SUGAR DAILY   cetirizine (ZYRTEC) 10 MG tablet Take 10 mg by mouth daily. Alternates with Allegra   Cholecalciferol (VITAMIN D ) 50 MCG (2000 UT) CAPS Take 2 capsules (4,000 Units total) by mouth daily. (Patient taking differently: Take 2,000 Units by mouth daily.)   ferrous sulfate dried (SLOW FE) 160 (50 FE) MG TBCR SR tablet Take 160 mg by mouth daily.   gabapentin  (NEURONTIN ) 100 MG capsule TAKE 1 CAPSULE (100 MG TOTAL) BY MOUTH THREE TIMES DAILY.   glucose blood (ONETOUCH ULTRA) test strip CHECK BLOOD SUGAR DAILY  hydrochlorothiazide  (HYDRODIURIL ) 25 MG tablet TAKE 1 TABLET DAILY FOR BLOOD PRESSURE & FLUID RETENTION (ANKLE SWELLING)   Magnesium 500 MG CAPS Take 500 mg by mouth daily.   meclizine  (ANTIVERT ) 25 MG tablet Take 1 tablet 3 x / day as needed for Dizziness   melatonin 3 MG TABS tablet Take 3 mg by mouth at bedtime.   metFORMIN  (GLUCOPHAGE -XR) 500 MG 24 hr tablet TAKE 1 TABLET IN AM AND 2 TABS IN PM  WITH MEALS FOR DIABETES   olmesartan  (BENICAR ) 40 MG tablet TAKE 1  TABLET BY MOUTH DAILY FOR BLOOD PRESSURE   ondansetron (ZOFRAN) 8 MG tablet Take 8 mg by mouth every 8 (eight) hours as needed.   potassium chloride  (KLOR-CON  M10) 10 MEQ tablet TAKE 1 TAB DAILY FOR LOW POTASSIUM.   promethazine  (PHENERGAN ) 25 MG tablet Take 1 tablet every 4 hours if needed for nausea or dizziness   triamcinolone  (KENALOG ) 0.025 % ointment Apply topically 3 (three) times daily.   vitamin B-12 (CYANOCOBALAMIN ) 1000 MCG tablet Take 1,000 mcg by mouth daily.   rosuvastatin  (CRESTOR ) 10 MG tablet TAKE 1 TABLET BY MOUTH EVERY DAY FOR CHOLESTEROL (Patient not taking: Reported on 03/25/2024)   No facility-administered encounter medications on file as of 03/25/2024.    Allergies (verified) Penicillins, Lisinopril , Sulfamethoxazole-trimethoprim, and Topamax  [topiramate ]   History: Past Medical History:  Diagnosis Date   Allergy    Closed fracture of proximal phalanx of great toe 03/06/2018   Hyperlipidemia    Hypertension    Iron deficiency    Meniere's disease    Neuropathy    Type II or unspecified type diabetes mellitus without mention of complication, not stated as uncontrolled    Vitamin D  deficiency    Past Surgical History:  Procedure Laterality Date   ABDOMINAL HYSTERECTOMY  1998   BSO   ORIF ANKLE FRACTURE Left 2000   SHOULDER SURGERY Right 02/2017   rotator cuff repair Dr. Arch   Family History  Problem Relation Age of Onset   Heart disease Mother    Pancreatic cancer Mother        PANCREAS   Alcoholism Mother    Hypertension Father    Heart disease Father    Diabetes Father    Kidney cancer Sister 3       mets   Esophageal cancer Brother 64   Lung cancer Brother    COPD Brother    Autism Brother    Heart disease Son    Kidney disease Son        FSGS   Breast cancer Paternal Aunt 29   Heart disease Maternal Grandmother        A fib   Liver cancer Maternal Grandmother    Social History   Socioeconomic History   Marital status: Married     Spouse name: Electronics engineer   Number of children: 3   Years of education: Not on file   Highest education level: Not on file  Occupational History   Occupation: RETIRED  Tobacco Use   Smoking status: Never   Smokeless tobacco: Never  Vaping Use   Vaping status: Never Used  Substance and Sexual Activity   Alcohol use: Yes    Comment: rarely   Drug use: No   Sexual activity: Not Currently    Partners: Male    Birth control/protection: Post-menopausal  Other Topics Concern   Not on file  Social History Narrative   Lives with husband and her brother also lives with them/2025  Social Drivers of Corporate investment banker Strain: Low Risk  (03/25/2024)   Overall Financial Resource Strain (CARDIA)    Difficulty of Paying Living Expenses: Not hard at all  Food Insecurity: No Food Insecurity (03/25/2024)   Hunger Vital Sign    Worried About Running Out of Food in the Last Year: Never true    Ran Out of Food in the Last Year: Never true  Transportation Needs: No Transportation Needs (03/25/2024)   PRAPARE - Administrator, Civil Service (Medical): No    Lack of Transportation (Non-Medical): No  Physical Activity: Inactive (03/25/2024)   Exercise Vital Sign    Days of Exercise per Week: 0 days    Minutes of Exercise per Session: 0 min  Stress: No Stress Concern Present (03/25/2024)   [employer] of Occupational Health - Occupational Stress Questionnaire    Feeling of Stress: Not at all  Social Connections: Moderately Isolated (03/25/2024)   Social Connection and Isolation Panel    Frequency of Communication with Friends and Family: Three times a week    Frequency of Social Gatherings with Friends and Family: Never    Attends Religious Services: Never    Database administrator or Organizations: No    Attends Engineer, structural: Never    Marital Status: Married    Tobacco Counseling Counseling given: Not Answered    Clinical Intake:  Pre-visit preparation  completed: Yes  Pain : No/denies pain     BMI - recorded: 26.02 Nutritional Status: BMI 25 -29 Overweight Nutritional Risks: None Diabetes: Yes CBG done?: Yes (131 per pt) CBG resulted in Enter/ Edit results?: No Did pt. bring in CBG monitor from home?: No  Lab Results  Component Value Date   HGBA1C 5.8 01/25/2024   HGBA1C 5.7 (H) 09/08/2023   HGBA1C 5.7 (H) 08/02/2023     How often do you need to have someone help you when you read instructions, pamphlets, or other written materials from your doctor or pharmacy?: 1 - Never  Interpreter Needed?: No  Information entered by :: Anaiyah Anglemyer, RMA   Activities of Daily Living     03/25/2024   10:17 AM  In your present state of health, do you have any difficulty performing the following activities:  Hearing? 1  Comment Lt hearing loss-Meniere's disease  Vision? 0  Difficulty concentrating or making decisions? 0  Walking or climbing stairs? 0  Dressing or bathing? 0  Doing errands, shopping? 0  Preparing Food and eating ? N  Using the Toilet? N  In the past six months, have you accidently leaked urine? N  Do you have problems with loss of bowel control? Y  Comment wears posise pads  Managing your Medications? N  Managing your Finances? N  Housekeeping or managing your Housekeeping? N    Patient Care Team: Plotnikov, Karlynn GAILS, MD as PCP - General (Internal Medicine) Debrah Lamar BIRCH, MD (Inactive) as Consulting Physician (Gastroenterology) Ethyl Lonni BRAVO, MD (Inactive) as Consulting Physician (Otolaryngology) Coralee Locus, OD (Optometry) Melita Drivers, MD as Consulting Physician (Orthopedic Surgery) Verta Royden DASEN, DPM as Consulting Physician (Podiatry)  I have updated your Care Teams any recent Medical Services you may have received from other providers in the past year.     Assessment:   This is a routine wellness examination for Cia.  Hearing/Vision screen Hearing Screening - Comments:: Lt hearing  loss-Meniere's disease Vision Screening - Comments:: Wears eyeglasses/[facility]-Dr. Malvina   Goals Addressed  This Visit's Progress     Patient Stated (pt-stated)        Keep her sugars maintained./2025       Depression Screen     03/25/2024   10:27 AM 04/23/2023   10:45 PM 01/12/2023   10:25 AM 01/11/2022    9:53 AM 09/07/2021   10:33 AM 04/13/2021   10:49 AM 01/11/2021    9:58 AM  PHQ 2/9 Scores  PHQ - 2 Score 0 0 0 1 1 0 1  PHQ- 9 Score 0          Fall Risk     03/25/2024   10:23 AM 04/23/2023   10:45 PM 01/12/2023   10:24 AM 01/11/2022    9:53 AM 04/13/2021   10:48 AM  Fall Risk   Falls in the past year? 1 0 1 1 0  Number falls in past yr: 0  0 0   Comment   Fell backwards off of a small step ladder - no injury.    Injury with Fall? 0  0 0   Risk for fall due to :  No Fall Risks  No Fall Risks No Fall Risks  Follow up Falls evaluation completed;Falls prevention discussed Falls prevention discussed;Education provided;Falls evaluation completed  Falls prevention discussed;Falls evaluation completed  Falls evaluation completed;Falls prevention discussed;Education provided      Data saved with a previous flowsheet row definition    MEDICARE RISK AT HOME:  Medicare Risk at Home Any stairs in or around the home?: Yes (3-4 steps coming in) If so, are there any without handrails?: No Home free of loose throw rugs in walkways, pet beds, electrical cords, etc?: Yes Adequate lighting in your home to reduce risk of falls?: Yes Life alert?: No Use of a cane, walker or w/c?: No Grab bars in the bathroom?: No Shower chair or bench in shower?: No Elevated toilet seat or a handicapped toilet?: No  TIMED UP AND GO:  Was the test performed?  No  Cognitive Function: Declined/Normal: No cognitive concerns noted by patient or family. Patient alert, oriented, able to answer questions appropriately and recall recent events. No signs of memory loss or confusion.         Immunizations Immunization History  Administered Date(s) Administered   Fluad Quad(high Dose 65+) 08/27/2021   Influenza Split 07/16/2013, 07/24/2014   Influenza, High Dose Seasonal PF 06/15/2015, 07/04/2016, 07/14/2017, 06/19/2018, 06/03/2019, 09/14/2020, 08/02/2023   Influenza-Unspecified 07/07/2017, 06/06/2022   PFIZER Comirnaty(Gray Top)Covid-19 Tri-Sucrose Vaccine 03/26/2021   PFIZER(Purple Top)SARS-COV-2 Vaccination 11/09/2019, 12/03/2019, 08/21/2020   PPD Test 04/14/2014   Pfizer Covid-19 Vaccine Bivalent Booster 60yrs & up 08/27/2021   Pneumococcal Conjugate-13 10/31/2014   Pneumococcal Polysaccharide-23 12/01/2004, 03/23/2016   Tdap 04/06/2011   Zoster Recombinant(Shingrix) 06/06/2022, 08/07/2022   Zoster, Live 07/16/2013    Screening Tests Health Maintenance  Topic Date Due   OPHTHALMOLOGY EXAM  02/18/2021   DTaP/Tdap/Td (2 - Td or Tdap) 04/05/2021   FOOT EXAM  09/07/2022   COVID-19 Vaccine (6 - 2024-25 season) 05/21/2023   INFLUENZA VACCINE  04/19/2024   HEMOGLOBIN A1C  07/27/2024   Diabetic kidney evaluation - Urine ACR  09/07/2024   Diabetic kidney evaluation - eGFR measurement  01/24/2025   MAMMOGRAM  02/12/2025   Medicare Annual Wellness (AWV)  03/25/2025   Fecal DNA (Cologuard)  09/28/2025   Pneumococcal Vaccine: 50+ Years  Completed   DEXA SCAN  Completed   Hepatitis C Screening  Completed   Zoster Vaccines- Shingrix  Completed  Hepatitis B Vaccines  Aged Out   HPV VACCINES  Aged Out   Meningococcal B Vaccine  Aged Out   Colonoscopy  Discontinued    Health Maintenance  Health Maintenance Due  Topic Date Due   OPHTHALMOLOGY EXAM  02/18/2021   DTaP/Tdap/Td (2 - Td or Tdap) 04/05/2021   FOOT EXAM  09/07/2022   COVID-19 Vaccine (6 - 2024-25 season) 05/21/2023   Health Maintenance Items Addressed: Diabetic Foot Exam recommended, See Nurse Notes at the end of this note  Additional Screening:  Vision Screening: Recommended annual  ophthalmology exams for early detection of glaucoma and other disorders of the eye. Would you like a referral to an eye doctor? No    Dental Screening: Recommended annual dental exams for proper oral hygiene  Community Resource Referral / Chronic Care Management: CRR required this visit?  No   CCM required this visit?  No   Plan:    I have personally reviewed and noted the following in the patient's chart:   Medical and social history Use of alcohol, tobacco or illicit drugs  Current medications and supplements including opioid prescriptions. Patient is not currently taking opioid prescriptions. Functional ability and status Nutritional status Physical activity Advanced directives List of other physicians Hospitalizations, surgeries, and ER visits in previous 12 months Vitals Screenings to include cognitive, depression, and falls Referrals and appointments  In addition, I have reviewed and discussed with patient certain preventive protocols, quality metrics, and best practice recommendations. A written personalized care plan for preventive services as well as general preventive health recommendations were provided to patient.   Rykin Route L Brayah Urquilla, CMA   03/25/2024   After Visit Summary: (MyChart) Due to this being a telephonic visit, the after visit summary with patients personalized plan was offered to patient via MyChart   Notes: Patient is due for a foot exam and will get done during up coming office visit.  She is also due for a Tdap.  Patient stated that she has had her diabetic eye exam this past year.  I have sent a request for patient's record today.  She had no other concerns to address today.  Medical screening examination/treatment/procedure(s) were performed by non-physician practitioner and as supervising physician I was immediately available for consultation/collaboration.  I agree with above. Karlynn Noel, MD

## 2024-03-27 ENCOUNTER — Ambulatory Visit: Admitting: Internal Medicine

## 2024-03-27 ENCOUNTER — Encounter: Payer: Self-pay | Admitting: Internal Medicine

## 2024-03-27 VITALS — BP 102/66 | HR 73 | Temp 97.9°F | Ht 59.5 in | Wt 132.0 lb

## 2024-03-27 DIAGNOSIS — N182 Chronic kidney disease, stage 2 (mild): Secondary | ICD-10-CM | POA: Diagnosis not present

## 2024-03-27 DIAGNOSIS — E1140 Type 2 diabetes mellitus with diabetic neuropathy, unspecified: Secondary | ICD-10-CM | POA: Diagnosis not present

## 2024-03-27 DIAGNOSIS — Z7984 Long term (current) use of oral hypoglycemic drugs: Secondary | ICD-10-CM

## 2024-03-27 DIAGNOSIS — E559 Vitamin D deficiency, unspecified: Secondary | ICD-10-CM | POA: Diagnosis not present

## 2024-03-27 DIAGNOSIS — I10 Essential (primary) hypertension: Secondary | ICD-10-CM | POA: Diagnosis not present

## 2024-03-27 DIAGNOSIS — E1022 Type 1 diabetes mellitus with diabetic chronic kidney disease: Secondary | ICD-10-CM

## 2024-03-27 DIAGNOSIS — R42 Dizziness and giddiness: Secondary | ICD-10-CM

## 2024-03-27 DIAGNOSIS — H8109 Meniere's disease, unspecified ear: Secondary | ICD-10-CM

## 2024-03-27 MED ORDER — HYDROCHLOROTHIAZIDE 25 MG PO TABS
25.0000 mg | ORAL_TABLET | Freq: Every day | ORAL | 3 refills | Status: AC
Start: 2024-03-27 — End: ?

## 2024-03-27 MED ORDER — PROMETHAZINE HCL 25 MG PO TABS: ORAL_TABLET | ORAL | 2 refills | Status: AC

## 2024-03-27 NOTE — Assessment & Plan Note (Addendum)
 On  Ziac  HCT - d/c  Low BP On hydrochlorothiazide , KCl

## 2024-03-27 NOTE — Assessment & Plan Note (Addendum)
 Meclizine  prn Promethazine  prn Pt is seeing ENT in [place] On HCTZ

## 2024-03-27 NOTE — Assessment & Plan Note (Signed)
On Metformin.  Check A1c 

## 2024-03-27 NOTE — Assessment & Plan Note (Signed)
 On Vit D

## 2024-03-27 NOTE — Progress Notes (Signed)
 Subjective:  Patient ID: Regina Cervantes, female    DOB: Oct 22, 1950  Age: 73 y.o. MRN: [id number]  CC: Medical Management of Chronic Issues (2 mnth f/u)   HPI Aima D Bernardi presents for Meniere's - pt had a flare up, DM 2, dyslipidemia  Off Crestor   Outpatient Medications Prior to Visit  Medication Sig Dispense Refill   aspirin 81 MG chewable tablet Chew 81 mg by mouth every other day.     BAYER MICROLET LANCETS lancets Use as instructed 100 each 12   Blood Glucose Monitoring Suppl (CONTOUR NEXT MONITOR) w/Device KIT USE TO CHECK BLOOD SUGAR DAILY 1 kit PRN   cetirizine (ZYRTEC) 10 MG tablet Take 10 mg by mouth daily. Alternates with Allegra     Cholecalciferol (VITAMIN D ) 50 MCG (2000 UT) CAPS Take 2 capsules (4,000 Units total) by mouth daily. (Patient taking differently: Take 2,000 Units by mouth daily.)     ferrous sulfate dried (SLOW FE) 160 (50 FE) MG TBCR SR tablet Take 160 mg by mouth daily.     gabapentin  (NEURONTIN ) 100 MG capsule TAKE 1 CAPSULE (100 MG TOTAL) BY MOUTH THREE TIMES DAILY. 90 capsule 5   glucose blood (ONETOUCH ULTRA) test strip CHECK BLOOD SUGAR DAILY 100 strip 3   Magnesium 500 MG CAPS Take 500 mg by mouth daily.     meclizine  (ANTIVERT ) 25 MG tablet Take 1 tablet 3 x / day as needed for Dizziness 30 tablet 2   melatonin 3 MG TABS tablet Take 3 mg by mouth at bedtime.     metFORMIN  (GLUCOPHAGE -XR) 500 MG 24 hr tablet TAKE 1 TABLET IN AM AND 2 TABS IN PM  WITH MEALS FOR DIABETES 270 tablet 1   olmesartan  (BENICAR ) 40 MG tablet TAKE 1 TABLET BY MOUTH DAILY FOR BLOOD PRESSURE 90 tablet 3   ondansetron (ZOFRAN) 8 MG tablet Take 8 mg by mouth every 8 (eight) hours as needed.     potassium chloride  (KLOR-CON  M10) 10 MEQ tablet TAKE 1 TAB DAILY FOR LOW POTASSIUM. 90 tablet 3   triamcinolone  (KENALOG ) 0.025 % ointment Apply topically 3 (three) times daily. 60 g 2   vitamin B-12 (CYANOCOBALAMIN ) 1000 MCG tablet Take 1,000 mcg by mouth daily.      bisoprolol -hydrochlorothiazide  (ZIAC ) 5-6.25 MG tablet TAKE 1 TABLET BY MOUTH EVERY DAY FOR BLOOD PRESSURE 90 tablet 4   hydrochlorothiazide  (HYDRODIURIL ) 25 MG tablet TAKE 1 TABLET DAILY FOR BLOOD PRESSURE & FLUID RETENTION (ANKLE SWELLING) 90 tablet 3   promethazine  (PHENERGAN ) 25 MG tablet Take 1 tablet every 4 hours if needed for nausea or dizziness 30 tablet 2   rosuvastatin  (CRESTOR ) 10 MG tablet TAKE 1 TABLET BY MOUTH EVERY DAY FOR CHOLESTEROL (Patient not taking: Reported on 03/27/2024) 90 tablet 3   No facility-administered medications prior to visit.    ROS: Review of Systems  Constitutional:  Negative for activity change, appetite change, chills, fatigue and unexpected weight change.  HENT:  Negative for congestion, mouth sores and sinus pressure.   Eyes:  Negative for visual disturbance.  Respiratory:  Negative for cough and chest tightness.   Gastrointestinal:  Negative for abdominal pain and nausea.  Genitourinary:  Negative for difficulty urinating, frequency and vaginal pain.  Musculoskeletal:  Negative for back pain and gait problem.  Skin:  Negative for pallor and rash.  Neurological:  Positive for dizziness. Negative for tremors, weakness, numbness and headaches.  Psychiatric/Behavioral:  Negative for confusion and sleep disturbance.     Objective:  BP 102/66   Pulse 73   Temp 97.9 F (36.6 C) (Oral)   Ht 4' 11.5 (1.511 m)   Wt 132 lb (59.9 kg)   SpO2 96%   BMI 26.21 kg/m   BP Readings from Last 3 Encounters:  03/27/24 102/66  01/25/24 118/82  09/08/23 120/68    Wt Readings from Last 3 Encounters:  03/27/24 132 lb (59.9 kg)  03/25/24 131 lb (59.4 kg)  01/25/24 131 lb 6.4 oz (59.6 kg)    Physical Exam Constitutional:      General: She is not in acute distress.    Appearance: She is well-developed.  HENT:     Head: Normocephalic.     Right Ear: External ear normal.     Left Ear: External ear normal.     Nose: Nose normal.  Eyes:     General:         Right eye: No discharge.        Left eye: No discharge.     Conjunctiva/sclera: Conjunctivae normal.     Pupils: Pupils are equal, round, and reactive to light.  Neck:     Thyroid : No thyromegaly.     Vascular: No JVD.     Trachea: No tracheal deviation.  Cardiovascular:     Rate and Rhythm: Normal rate and regular rhythm.     Heart sounds: Normal heart sounds.  Pulmonary:     Effort: No respiratory distress.     Breath sounds: No stridor. No wheezing.  Abdominal:     General: Bowel sounds are normal. There is no distension.     Palpations: Abdomen is soft. There is no mass.     Tenderness: There is no abdominal tenderness. There is no guarding or rebound.  Musculoskeletal:        General: No tenderness.     Cervical back: Normal range of motion and neck supple. No rigidity.     Right lower leg: No edema.     Left lower leg: No edema.  Lymphadenopathy:     Cervical: No cervical adenopathy.  Skin:    Findings: No erythema or rash.  Neurological:     Cranial Nerves: No cranial nerve deficit.     Motor: No abnormal muscle tone.     Coordination: Coordination normal.     Deep Tendon Reflexes: Reflexes normal.  Psychiatric:        Behavior: Behavior normal.        Thought Content: Thought content normal.        Judgment: Judgment normal.     Lab Results  Component Value Date   WBC 9.0 01/25/2024   HGB 12.5 01/25/2024   HCT 37.5 01/25/2024   PLT 254.0 01/25/2024   GLUCOSE 135 (H) 01/25/2024   CHOL 148 01/25/2024   TRIG 114.0 01/25/2024   HDL 50.00 01/25/2024   LDLCALC 75 01/25/2024   ALT 13 01/25/2024   AST 18 01/25/2024   NA 133 (L) 01/25/2024   K 3.9 01/25/2024   CL 97 01/25/2024   CREATININE 0.69 01/25/2024   BUN 13 01/25/2024   CO2 28 01/25/2024   TSH 1.92 01/25/2024   HGBA1C 5.8 01/25/2024   MICROALBUR 0.6 09/08/2023    MM 3D SCREENING MAMMOGRAM BILATERAL BREAST Result Date: 02/15/2024 CLINICAL DATA:  Screening. EXAM: DIGITAL SCREENING BILATERAL  MAMMOGRAM WITH TOMOSYNTHESIS AND CAD TECHNIQUE: Bilateral screening digital craniocaudal and mediolateral oblique mammograms were obtained. Bilateral screening digital breast tomosynthesis was performed. The images were evaluated with computer-aided detection.  COMPARISON:  Previous exam(s). ACR Breast Density Category b: There are scattered areas of fibroglandular density. FINDINGS: There are no findings suspicious for malignancy. IMPRESSION: No mammographic evidence of malignancy. A result letter of this screening mammogram will be mailed directly to the patient. RECOMMENDATION: Screening mammogram in one year. (Code:SM-B-01Y) BI-RADS CATEGORY  1: Negative. Electronically Signed   By: Craig Farr M.D.   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)   On Metformin  Check A1c      Relevant Orders   Comprehensive metabolic panel with GFR   Hemoglobin A1c   CK   Lipid panel   CKD stage 2 due to type 1 diabetes mellitus (HCC)   Hydrate well      Other Visit Diagnoses       Vertigo       Relevant Medications   promethazine  (PHENERGAN ) 25 MG tablet         Meds ordered this encounter  Medications   promethazine  (PHENERGAN ) 25 MG tablet    Sig: Take 1 tablet every 4 hours if needed for nausea or dizziness    Dispense:  30 tablet    Refill:  2   hydrochlorothiazide  (HYDRODIURIL ) 25 MG tablet    Sig: Take 1 tablet (25 mg total) by mouth daily.    Dispense:  90 tablet    Refill:  3       Follow-up: Return in about 3 months (around 06/27/2024) for a follow-up visit.  Marolyn Noel, MD

## 2024-03-27 NOTE — Assessment & Plan Note (Signed)
 Hydrate well

## 2024-04-25 ENCOUNTER — Telehealth: Payer: Self-pay | Admitting: Internal Medicine

## 2024-04-25 NOTE — Telephone Encounter (Unsigned)
 Copied from CRM [id number]. Topic: Clinical - Medication Refill >> Apr 25, 2024  2:33 PM Chasity T wrote: Medication: metFORMIN  (GLUCOPHAGE -XR) 500 MG  Has the patient contacted their pharmacy? Yes   This is the patient's preferred pharmacy:  [id number] - [place], [place] - [address] [facility] [place] [address] Phone: [phone] Fax: [phone]   Is this the correct pharmacy for this prescription? Yes If no, delete pharmacy and type the correct one.   Has the prescription been filled recently? No  Is the patient out of the medication? Yes  Has the patient been seen for an appointment in the last year OR does the patient have an upcoming appointment? Yes  Can we respond through MyChart? Yes  Agent: Please be advised that Rx refills may take up to 3 business days. We ask that you follow-up with your pharmacy.

## 2024-04-30 ENCOUNTER — Encounter: Payer: Self-pay | Admitting: Podiatry

## 2024-04-30 ENCOUNTER — Ambulatory Visit: Admitting: Podiatry

## 2024-04-30 DIAGNOSIS — B351 Tinea unguium: Secondary | ICD-10-CM

## 2024-04-30 DIAGNOSIS — D2371 Other benign neoplasm of skin of right lower limb, including hip: Secondary | ICD-10-CM | POA: Diagnosis not present

## 2024-04-30 DIAGNOSIS — M79676 Pain in unspecified toe(s): Secondary | ICD-10-CM | POA: Diagnosis not present

## 2024-04-30 DIAGNOSIS — D2372 Other benign neoplasm of skin of left lower limb, including hip: Secondary | ICD-10-CM | POA: Diagnosis not present

## 2024-04-30 NOTE — Progress Notes (Signed)
 She presents today chief complaint of painful elongated toenails and callus to the plantar aspect of the left foot.  She also has a small callus medial aspect of the second digit at the PIPJ.  Objective: Vitals are stable alert oriented x 3.  Pulses are palpable.  Medial PIPJ of the second is right foot does demonstrate a mild reactive hyperkeratotic lesion left foot does demonstrate a benign skin lesion plantar aspect of the forefoot left.  No open lesions or wounds.  Toenails are long thick yellow dystrophic with mycotic tissue on the right side.  Assessment: Pain in limb secondary to onychomycosis benign skin lesions toes right forefoot left.  Plan: Debridement of benign skin lesions bilaterally debridement of toenails 1 through 5 bilateral.  Follow-up with her in 3 months

## 2024-05-03 ENCOUNTER — Other Ambulatory Visit: Payer: Self-pay | Admitting: Internal Medicine

## 2024-05-03 DIAGNOSIS — E1140 Type 2 diabetes mellitus with diabetic neuropathy, unspecified: Secondary | ICD-10-CM

## 2024-05-03 MED ORDER — METFORMIN HCL ER 500 MG PO TB24: ORAL_TABLET | ORAL | 1 refills | Status: DC

## 2024-05-03 NOTE — Telephone Encounter (Signed)
 Copied from CRM #[id number]. Topic: Clinical - Prescription Issue >> May 03, 2024  1:14 PM Macario HERO wrote: Reason for CRM: Patient is calling because she's been out of her medication: metFORMIN  (GLUCOPHAGE -XR) 500 MG for over a week and started taking the expired medication.Patient called 04/25/24 for a refill and is now requesting a call back regarding this. Advised patient she should receive a call before the end of the day.

## 2024-06-18 ENCOUNTER — Ambulatory Visit: Payer: Medicare HMO | Admitting: Nurse Practitioner

## 2024-06-27 ENCOUNTER — Ambulatory Visit: Admitting: Internal Medicine

## 2024-06-27 ENCOUNTER — Encounter: Payer: Self-pay | Admitting: Internal Medicine

## 2024-06-27 VITALS — BP 128/72 | Temp 98.4°F | Ht 59.0 in | Wt 129.4 lb

## 2024-06-27 DIAGNOSIS — E1140 Type 2 diabetes mellitus with diabetic neuropathy, unspecified: Secondary | ICD-10-CM

## 2024-06-27 DIAGNOSIS — Z23 Encounter for immunization: Secondary | ICD-10-CM | POA: Diagnosis not present

## 2024-06-27 DIAGNOSIS — I10 Essential (primary) hypertension: Secondary | ICD-10-CM

## 2024-06-27 DIAGNOSIS — R4589 Other symptoms and signs involving emotional state: Secondary | ICD-10-CM

## 2024-06-27 DIAGNOSIS — N182 Chronic kidney disease, stage 2 (mild): Secondary | ICD-10-CM

## 2024-06-27 DIAGNOSIS — G8929 Other chronic pain: Secondary | ICD-10-CM

## 2024-06-27 DIAGNOSIS — Z7984 Long term (current) use of oral hypoglycemic drugs: Secondary | ICD-10-CM | POA: Diagnosis not present

## 2024-06-27 DIAGNOSIS — M25511 Pain in right shoulder: Secondary | ICD-10-CM

## 2024-06-27 DIAGNOSIS — H8109 Meniere's disease, unspecified ear: Secondary | ICD-10-CM

## 2024-06-27 DIAGNOSIS — E1022 Type 1 diabetes mellitus with diabetic chronic kidney disease: Secondary | ICD-10-CM

## 2024-06-27 DIAGNOSIS — E559 Vitamin D deficiency, unspecified: Secondary | ICD-10-CM

## 2024-06-27 DIAGNOSIS — E1122 Type 2 diabetes mellitus with diabetic chronic kidney disease: Secondary | ICD-10-CM

## 2024-06-27 MED ORDER — CELECOXIB 200 MG PO CAPS: 200.0000 mg | ORAL_CAPSULE | Freq: Two times a day (BID) | ORAL | 1 refills | Status: DC

## 2024-06-27 NOTE — Patient Instructions (Addendum)
 Blue Emu for feet 2-3 times a day Sop Gabapentin   USEFUL THINGS FOR ARTHRITIS and musculoskeletal pains:    A rice sock heating pad refers to a homemade heating pad created by filling a sock with uncooked rice, which can be heated in a microwave to provide a warm compress for sore muscles, pain relief, or other applications; essentially, it's a simple way to generate heat using readily available materials.  Key points about rice sock heat: How to make it: Fill a clean sock (preferably a tube sock) about 2/3 full with uncooked rice, tie a knot at the top to secure the rice inside.  Heating it up: Place the rice sock in the microwave and heat in short intervals (usually around 30 seconds at a time) until it reaches the desired warmth.  Important considerations: Check temperature before applying: Always test the temperature of the rice sock before applying it to your skin to avoid burns.  Use a towel to protect skin: Wrap the rice sock in a thin towel to distribute the heat evenly and protect your skin.  Uses: Muscle aches and pains  Menstrual cramps  Neck pain  Arthritis discomfort    BLUE EMU CREAM: Use it 2-3 times a day on painful areas

## 2024-06-27 NOTE — Progress Notes (Signed)
 Subjective:  Patient ID: Regina Cervantes, female    DOB: 1950/12/12  Age: 73 y.o. MRN: [id number]  CC: Follow-up (Patient states a few things to discuss. Patient wants Flu vaccine. )   HPI Regina Cervantes presents for depressed mood, neuropathy, Meniere's C/o R shoulder pain  Outpatient Medications Prior to Visit  Medication Sig Dispense Refill   aspirin 81 MG chewable tablet Chew 81 mg by mouth every other day.     BAYER MICROLET LANCETS lancets Use as instructed 100 each 12   Blood Glucose Monitoring Suppl (CONTOUR NEXT MONITOR) w/Device KIT USE TO CHECK BLOOD SUGAR DAILY 1 kit PRN   cetirizine (ZYRTEC) 10 MG tablet Take 10 mg by mouth daily. Alternates with Allegra     Cholecalciferol (VITAMIN D ) 50 MCG (2000 UT) CAPS Take 2 capsules (4,000 Units total) by mouth daily. (Patient taking differently: Take 2,000 Units by mouth daily.)     ferrous sulfate dried (SLOW FE) 160 (50 FE) MG TBCR SR tablet Take 160 mg by mouth daily.     gabapentin  (NEURONTIN ) 100 MG capsule TAKE 1 CAPSULE (100 MG TOTAL) BY MOUTH THREE TIMES DAILY. 90 capsule 5   glucose blood (ONETOUCH ULTRA) test strip CHECK BLOOD SUGAR DAILY 100 strip 3   hydrochlorothiazide  (HYDRODIURIL ) 25 MG tablet Take 1 tablet (25 mg total) by mouth daily. 90 tablet 3   Magnesium 500 MG CAPS Take 500 mg by mouth daily.     meclizine  (ANTIVERT ) 25 MG tablet Take 1 tablet 3 x / day as needed for Dizziness 30 tablet 2   melatonin 3 MG TABS tablet Take 3 mg by mouth at bedtime.     metFORMIN  (GLUCOPHAGE -XR) 500 MG 24 hr tablet TAKE 1 TABLET IN AM AND 2 TABS IN PM  WITH MEALS FOR DIABETES 270 tablet 1   olmesartan  (BENICAR ) 40 MG tablet TAKE 1 TABLET BY MOUTH DAILY FOR BLOOD PRESSURE 90 tablet 3   ondansetron (ZOFRAN) 8 MG tablet Take 8 mg by mouth every 8 (eight) hours as needed.     potassium chloride  (KLOR-CON  M10) 10 MEQ tablet TAKE 1 TAB DAILY FOR LOW POTASSIUM. 90 tablet 3   promethazine  (PHENERGAN ) 25 MG tablet Take 1 tablet  every 4 hours if needed for nausea or dizziness 30 tablet 2   triamcinolone  (KENALOG ) 0.025 % ointment Apply topically 3 (three) times daily. 60 g 2   vitamin B-12 (CYANOCOBALAMIN ) 1000 MCG tablet Take 1,000 mcg by mouth daily.     No facility-administered medications prior to visit.    ROS: Review of Systems  Constitutional:  Negative for activity change, appetite change, chills, fatigue and unexpected weight change.  HENT:  Negative for congestion, mouth sores and sinus pressure.   Eyes:  Negative for visual disturbance.  Respiratory:  Negative for cough and chest tightness.   Gastrointestinal:  Negative for abdominal pain and nausea.  Genitourinary:  Negative for difficulty urinating, frequency and vaginal pain.  Musculoskeletal:  Negative for back pain and gait problem.  Skin:  Negative for pallor and rash.  Neurological:  Negative for dizziness, tremors, weakness, numbness and headaches.  Psychiatric/Behavioral:  Negative for confusion, sleep disturbance and suicidal ideas. The patient is nervous/anxious.     Objective:  BP 128/72   Temp 98.4 F (36.9 C) (Oral)   Ht 4' 11 (1.499 m)   Wt 129 lb 6.4 oz (58.7 kg)   BMI 26.14 kg/m   BP Readings from Last 3 Encounters:  06/27/24 128/72  03/27/24  102/66  01/25/24 118/82    Wt Readings from Last 3 Encounters:  06/27/24 129 lb 6.4 oz (58.7 kg)  03/27/24 132 lb (59.9 kg)  03/25/24 131 lb (59.4 kg)    Physical Exam Constitutional:      General: She is not in acute distress.    Appearance: Normal appearance. She is well-developed.  HENT:     Head: Normocephalic.     Right Ear: External ear normal.     Left Ear: External ear normal.     Nose: Nose normal.  Eyes:     General:        Right eye: No discharge.        Left eye: No discharge.     Conjunctiva/sclera: Conjunctivae normal.     Pupils: Pupils are equal, round, and reactive to light.  Neck:     Thyroid : No thyromegaly.     Vascular: No JVD.     Trachea: No  tracheal deviation.  Cardiovascular:     Rate and Rhythm: Normal rate and regular rhythm.     Heart sounds: Normal heart sounds.  Pulmonary:     Effort: No respiratory distress.     Breath sounds: No stridor. No wheezing.  Abdominal:     General: Bowel sounds are normal. There is no distension.     Palpations: Abdomen is soft. There is no mass.     Tenderness: There is no abdominal tenderness. There is no guarding or rebound.  Musculoskeletal:        General: Tenderness present.     Cervical back: Normal range of motion and neck supple. No rigidity.  Lymphadenopathy:     Cervical: No cervical adenopathy.  Skin:    Findings: No erythema or rash.  Neurological:     Mental Status: She is oriented to person, place, and time.     Cranial Nerves: No cranial nerve deficit.     Motor: No abnormal muscle tone.     Coordination: Coordination normal.     Gait: Gait normal.     Deep Tendon Reflexes: Reflexes normal.  Psychiatric:        Behavior: Behavior normal.        Thought Content: Thought content normal.        Judgment: Judgment normal.   R shoulder pain on ROM  Lab Results  Component Value Date   WBC 9.0 01/25/2024   HGB 12.5 01/25/2024   HCT 37.5 01/25/2024   PLT 254.0 01/25/2024   GLUCOSE 135 (H) 01/25/2024   CHOL 148 01/25/2024   TRIG 114.0 01/25/2024   HDL 50.00 01/25/2024   LDLCALC 75 01/25/2024   ALT 13 01/25/2024   AST 18 01/25/2024   NA 133 (L) 01/25/2024   K 3.9 01/25/2024   CL 97 01/25/2024   CREATININE 0.69 01/25/2024   BUN 13 01/25/2024   CO2 28 01/25/2024   TSH 1.92 01/25/2024   HGBA1C 5.8 01/25/2024   MICROALBUR 0.6 09/08/2023    MM 3D SCREENING MAMMOGRAM BILATERAL BREAST Result Date: 02/15/2024 CLINICAL DATA:  Screening. EXAM: DIGITAL SCREENING BILATERAL MAMMOGRAM WITH TOMOSYNTHESIS AND CAD TECHNIQUE: Bilateral screening digital craniocaudal and mediolateral oblique mammograms were obtained. Bilateral screening digital breast tomosynthesis was  performed. The images were evaluated with computer-aided detection. COMPARISON:  Previous exam(s). ACR Breast Density Category b: There are scattered areas of fibroglandular density. FINDINGS: There are no findings suspicious for malignancy. IMPRESSION: No mammographic evidence of malignancy. A result letter of this screening mammogram will be mailed directly  to the patient. RECOMMENDATION: Screening mammogram in one year. (Code:SM-B-01Y) BI-RADS CATEGORY  1: Negative. Electronically Signed   By: Craig Farr M.D.   On: 02/15/2024 09:21    Assessment & Plan:   Problem List Items Addressed This Visit     CKD stage 2 due to type 1 diabetes mellitus (HCC)   Hydrate well Careful w NSAIDs      Depressed mood   D/c Gabapentin        Hypertension   On  Ziac  HCT - d/c  Low BP On hydrochlorothiazide , KCl      Meniere's disease   Meclizine  prn Promethazine  prn Pt is seeing ENT in [place] On HCTZ      Shoulder pain, right   Blue Emu ROM exercises      Type 2 diabetes mellitus with sensory neuropathy (HCC)   On Metformin  Check A1c      Vitamin D  deficiency   On Vit D      Other Visit Diagnoses       Immunization due    -  Primary   Relevant Orders   Flu vaccine HIGH DOSE PF(Fluzone  Trivalent) (Completed)         Meds ordered this encounter  Medications   celecoxib (CELEBREX) 200 MG capsule    Sig: Take 1 capsule (200 mg total) by mouth 2 (two) times daily.    Dispense:  60 capsule    Refill:  1      Follow-up: Return in about 3 months (around 09/27/2024) for a follow-up visit.  Marolyn Noel, MD

## 2024-06-27 NOTE — Assessment & Plan Note (Signed)
 Meclizine  prn Promethazine  prn Pt is seeing ENT in [place] On HCTZ

## 2024-06-27 NOTE — Assessment & Plan Note (Signed)
 Blue Emu ROM exercises

## 2024-06-27 NOTE — Assessment & Plan Note (Addendum)
 Hydrate well Careful w NSAIDs

## 2024-06-27 NOTE — Assessment & Plan Note (Signed)
Dc Gabapentin.  

## 2024-06-27 NOTE — Assessment & Plan Note (Signed)
 On Vit D

## 2024-06-27 NOTE — Assessment & Plan Note (Signed)
On Metformin.  Check A1c 

## 2024-06-27 NOTE — Assessment & Plan Note (Signed)
 On  Ziac  HCT - d/c  Low BP On hydrochlorothiazide , KCl

## 2024-08-01 ENCOUNTER — Ambulatory Visit: Admitting: Podiatry

## 2024-08-13 ENCOUNTER — Encounter: Payer: Self-pay | Admitting: Podiatry

## 2024-08-13 ENCOUNTER — Ambulatory Visit: Admitting: Podiatry

## 2024-08-13 DIAGNOSIS — D2372 Other benign neoplasm of skin of left lower limb, including hip: Secondary | ICD-10-CM | POA: Diagnosis not present

## 2024-08-13 DIAGNOSIS — M79676 Pain in unspecified toe(s): Secondary | ICD-10-CM

## 2024-08-13 DIAGNOSIS — D2371 Other benign neoplasm of skin of right lower limb, including hip: Secondary | ICD-10-CM | POA: Diagnosis not present

## 2024-08-13 DIAGNOSIS — B351 Tinea unguium: Secondary | ICD-10-CM | POA: Diagnosis not present

## 2024-08-13 NOTE — Progress Notes (Signed)
 She presents today chief complaint of painful elongated toenails and callus to the plantar aspect of the left foot.  She also has a small callus medial aspect of the second digit at the PIPJ.  Objective: Vitals are stable alert oriented x 3.  Pulses are palpable.  Medial PIPJ of the second is right foot does demonstrate a mild reactive hyperkeratotic lesion left foot does demonstrate a benign skin lesion plantar aspect of the forefoot left.  No open lesions or wounds.  Toenails are long thick yellow dystrophic with mycotic tissue on the right side.  Assessment: Pain in limb secondary to onychomycosis benign skin lesions toes right forefoot left.  Plan: Debridement of benign skin lesions bilaterally debridement of toenails 1 through 5 bilateral.  Follow-up with her in 3 months

## 2024-08-21 ENCOUNTER — Telehealth: Payer: Self-pay

## 2024-08-21 NOTE — Telephone Encounter (Signed)
 Copied from CRM [id number]. Topic: Clinical - Prescription Issue >> Aug 21, 2024  3:29 PM Tysheama G wrote: Reason for CRM: Patient is trying to refill olmesartan  (BENICAR ) 40 MG tablet; but it showing up as discontinued. Will Dr.P prescribe patient something else. She stated she has a week worth left.  Callback number [phone]

## 2024-08-22 ENCOUNTER — Other Ambulatory Visit: Payer: Self-pay

## 2024-08-22 DIAGNOSIS — I10 Essential (primary) hypertension: Secondary | ICD-10-CM

## 2024-08-22 MED ORDER — OLMESARTAN MEDOXOMIL 40 MG PO TABS: ORAL_TABLET | ORAL | 3 refills | Status: AC

## 2024-09-17 ENCOUNTER — Other Ambulatory Visit: Payer: Self-pay | Admitting: Internal Medicine

## 2024-09-17 NOTE — Telephone Encounter (Unsigned)
 Copied from CRM [id number]. Topic: Clinical - Medication Refill >> Sep 17, 2024 11:44 AM Zebedee SAUNDERS wrote: Medication: celecoxib  (CELEBREX ) 200 MG capsule  Has the patient contacted their pharmacy? Yes (Agent: If no, request that the patient contact the pharmacy for the refill. If patient does not wish to contact the pharmacy document the reason why and proceed with request.) (Agent: If yes, when and what did the pharmacy advise?)  This is the patient's preferred pharmacy:  [id number] - [place], [place] - [address] [facility] [place] [address] Phone: [phone] Fax: [phone]  Is this the correct pharmacy for this prescription? Yes If no, delete pharmacy and type the correct one.   Has the prescription been filled recently? Yes  Is the patient out of the medication? Yes  Has the patient been seen for an appointment in the last year OR does the patient have an upcoming appointment? Yes  Can we respond through MyChart? Yes  Agent: Please be advised that Rx refills may take up to 3 business days. We ask that you follow-up with your pharmacy.

## 2024-09-18 MED ORDER — CELECOXIB 200 MG PO CAPS: 200.0000 mg | ORAL_CAPSULE | Freq: Two times a day (BID) | ORAL | 1 refills | Status: AC

## 2024-09-25 ENCOUNTER — Encounter: Payer: Medicare HMO | Admitting: Internal Medicine

## 2024-09-30 ENCOUNTER — Ambulatory Visit: Admitting: Internal Medicine

## 2024-09-30 ENCOUNTER — Encounter: Payer: Self-pay | Admitting: Internal Medicine

## 2024-09-30 VITALS — BP 126/84 | HR 104 | Ht 59.0 in | Wt 127.4 lb

## 2024-09-30 DIAGNOSIS — E1169 Type 2 diabetes mellitus with other specified complication: Secondary | ICD-10-CM | POA: Diagnosis not present

## 2024-09-30 DIAGNOSIS — G8929 Other chronic pain: Secondary | ICD-10-CM | POA: Diagnosis not present

## 2024-09-30 DIAGNOSIS — I10 Essential (primary) hypertension: Secondary | ICD-10-CM | POA: Diagnosis not present

## 2024-09-30 DIAGNOSIS — E1140 Type 2 diabetes mellitus with diabetic neuropathy, unspecified: Secondary | ICD-10-CM

## 2024-09-30 DIAGNOSIS — M25511 Pain in right shoulder: Secondary | ICD-10-CM | POA: Diagnosis not present

## 2024-09-30 DIAGNOSIS — Z7984 Long term (current) use of oral hypoglycemic drugs: Secondary | ICD-10-CM | POA: Diagnosis not present

## 2024-09-30 DIAGNOSIS — E785 Hyperlipidemia, unspecified: Secondary | ICD-10-CM

## 2024-09-30 NOTE — Assessment & Plan Note (Signed)
" °  On hydrochlorothiazide , KCl Low BP  "

## 2024-09-30 NOTE — Assessment & Plan Note (Signed)
 Chronic On Celebrex  prn Ortho ref - Dr Melita

## 2024-09-30 NOTE — Assessment & Plan Note (Signed)
 Doing well Taking Crestor 

## 2024-09-30 NOTE — Assessment & Plan Note (Signed)
 Doing better on Celebrex 

## 2024-09-30 NOTE — Assessment & Plan Note (Signed)
On Metformin.  Check A1c 

## 2024-09-30 NOTE — Progress Notes (Signed)
 "  Subjective:  Patient ID: Regina Cervantes, female    DOB: 10/11/1950  Age: 74 y.o. MRN: [id number]  CC: Medical Management of Chronic Issues (3 Month follow up)   HPI CATHLEEN YAGI presents for R shoulder pain - 50% better on Celebrex  F/u on DM, HTN, neuropathy  Outpatient Medications Prior to Visit  Medication Sig Dispense Refill   aspirin 81 MG chewable tablet Chew 81 mg by mouth every other day.     BAYER MICROLET LANCETS lancets Use as instructed 100 each 12   Blood Glucose Monitoring Suppl (CONTOUR NEXT MONITOR) w/Device KIT USE TO CHECK BLOOD SUGAR DAILY 1 kit PRN   celecoxib  (CELEBREX ) 200 MG capsule Take 1 capsule (200 mg total) by mouth 2 (two) times daily. 60 capsule 1   cetirizine (ZYRTEC) 10 MG tablet Take 10 mg by mouth daily. Alternates with Allegra     Cholecalciferol (VITAMIN D ) 50 MCG (2000 UT) CAPS Take 2 capsules (4,000 Units total) by mouth daily. (Patient taking differently: Take 2,000 Units by mouth daily.)     ferrous sulfate dried (SLOW FE) 160 (50 FE) MG TBCR SR tablet Take 160 mg by mouth daily.     gabapentin  (NEURONTIN ) 100 MG capsule TAKE 1 CAPSULE (100 MG TOTAL) BY MOUTH THREE TIMES DAILY. 90 capsule 5   glucose blood (ONETOUCH ULTRA) test strip CHECK BLOOD SUGAR DAILY 100 strip 3   hydrochlorothiazide  (HYDRODIURIL ) 25 MG tablet Take 1 tablet (25 mg total) by mouth daily. 90 tablet 3   Magnesium 500 MG CAPS Take 500 mg by mouth daily.     meclizine  (ANTIVERT ) 25 MG tablet Take 1 tablet 3 x / day as needed for Dizziness 30 tablet 2   melatonin 3 MG TABS tablet Take 3 mg by mouth at bedtime.     metFORMIN  (GLUCOPHAGE -XR) 500 MG 24 hr tablet TAKE 1 TABLET IN AM AND 2 TABS IN PM  WITH MEALS FOR DIABETES 270 tablet 1   olmesartan  (BENICAR ) 40 MG tablet TAKE 1 TABLET BY MOUTH DAILY FOR BLOOD PRESSURE 90 tablet 3   ondansetron (ZOFRAN) 8 MG tablet Take 8 mg by mouth every 8 (eight) hours as needed.     potassium chloride  (KLOR-CON  M10) 10 MEQ tablet TAKE 1  TAB DAILY FOR LOW POTASSIUM. 90 tablet 3   promethazine  (PHENERGAN ) 25 MG tablet Take 1 tablet every 4 hours if needed for nausea or dizziness 30 tablet 2   triamcinolone  (KENALOG ) 0.025 % ointment Apply topically 3 (three) times daily. 60 g 2   vitamin B-12 (CYANOCOBALAMIN ) 1000 MCG tablet Take 1,000 mcg by mouth daily.     No facility-administered medications prior to visit.    ROS: Review of Systems  Constitutional:  Negative for activity change, appetite change, chills, fatigue and unexpected weight change.  HENT:  Negative for congestion, mouth sores and sinus pressure.   Eyes:  Negative for visual disturbance.  Respiratory:  Negative for cough and chest tightness.   Gastrointestinal:  Negative for abdominal pain and nausea.  Genitourinary:  Negative for difficulty urinating, frequency and vaginal pain.  Musculoskeletal:  Positive for arthralgias. Negative for back pain and gait problem.  Skin:  Negative for pallor and rash.  Neurological:  Negative for dizziness, tremors, weakness, numbness and headaches.  Psychiatric/Behavioral:  Negative for confusion and sleep disturbance.     Objective:  BP 126/84   Pulse (!) 104   Ht 4' 11 (1.499 m)   Wt 127 lb 6.4 oz (57.8 kg)  SpO2 93%   BMI 25.73 kg/m   BP Readings from Last 3 Encounters:  09/30/24 126/84  06/27/24 128/72  03/27/24 102/66    Wt Readings from Last 3 Encounters:  09/30/24 127 lb 6.4 oz (57.8 kg)  06/27/24 129 lb 6.4 oz (58.7 kg)  03/27/24 132 lb (59.9 kg)    Physical Exam Constitutional:      General: She is not in acute distress.    Appearance: She is well-developed. She is obese.  HENT:     Head: Normocephalic.     Right Ear: External ear normal.     Left Ear: External ear normal.     Nose: Nose normal.  Eyes:     General:        Right eye: No discharge.        Left eye: No discharge.     Conjunctiva/sclera: Conjunctivae normal.     Pupils: Pupils are equal, round, and reactive to light.   Neck:     Thyroid : No thyromegaly.     Vascular: No JVD.     Trachea: No tracheal deviation.  Cardiovascular:     Rate and Rhythm: Normal rate and regular rhythm.     Heart sounds: Normal heart sounds.  Pulmonary:     Effort: No respiratory distress.     Breath sounds: No stridor. No wheezing.  Abdominal:     General: Bowel sounds are normal. There is no distension.     Palpations: Abdomen is soft. There is no mass.     Tenderness: There is no abdominal tenderness. There is no guarding or rebound.  Musculoskeletal:        General: No tenderness.     Cervical back: Normal range of motion and neck supple. No rigidity.  Lymphadenopathy:     Cervical: No cervical adenopathy.  Skin:    Findings: No erythema or rash.  Neurological:     Cranial Nerves: No cranial nerve deficit.     Motor: No abnormal muscle tone.     Coordination: Coordination normal.     Deep Tendon Reflexes: Reflexes normal.  Psychiatric:        Behavior: Behavior normal.        Thought Content: Thought content normal.        Judgment: Judgment normal.     Lab Results  Component Value Date   WBC 9.0 01/25/2024   HGB 12.5 01/25/2024   HCT 37.5 01/25/2024   PLT 254.0 01/25/2024   GLUCOSE 135 (H) 01/25/2024   CHOL 148 01/25/2024   TRIG 114.0 01/25/2024   HDL 50.00 01/25/2024   LDLCALC 75 01/25/2024   ALT 13 01/25/2024   AST 18 01/25/2024   NA 133 (L) 01/25/2024   K 3.9 01/25/2024   CL 97 01/25/2024   CREATININE 0.69 01/25/2024   BUN 13 01/25/2024   CO2 28 01/25/2024   TSH 1.92 01/25/2024   HGBA1C 5.8 01/25/2024   MICROALBUR 0.6 09/08/2023    MM 3D SCREENING MAMMOGRAM BILATERAL BREAST Result Date: 02/15/2024 CLINICAL DATA:  Screening. EXAM: DIGITAL SCREENING BILATERAL MAMMOGRAM WITH TOMOSYNTHESIS AND CAD TECHNIQUE: Bilateral screening digital craniocaudal and mediolateral oblique mammograms were obtained. Bilateral screening digital breast tomosynthesis was performed. The images were evaluated with  computer-aided detection. COMPARISON:  Previous exam(s). ACR Breast Density Category b: There are scattered areas of fibroglandular density. FINDINGS: There are no findings suspicious for malignancy. IMPRESSION: No mammographic evidence of malignancy. A result letter of this screening mammogram will be mailed directly to the  patient. RECOMMENDATION: Screening mammogram in one year. (Code:SM-B-01Y) BI-RADS CATEGORY  1: Negative. Electronically Signed   By: Craig Farr M.D.   On: 02/15/2024 09:21    Assessment & Plan:   Problem List Items Addressed This Visit     Hyperlipidemia associated with type 2 diabetes mellitus (HCC)   Doing well Taking Crestor       Hypertension    On hydrochlorothiazide , KCl Low BP       Type 2 diabetes mellitus with sensory neuropathy (HCC)   On Metformin  Check A1c      Neuropathy due to type 2 diabetes mellitus (HCC)   Doing better on Celebrex       Shoulder pain, right - Primary   Chronic On Celebrex  prn Ortho ref - Dr Melita         No orders of the defined types were placed in this encounter.     Follow-up: Return in about 3 months (around 12/29/2024) for a follow-up visit.  Marolyn Noel, MD "

## 2024-10-03 ENCOUNTER — Other Ambulatory Visit

## 2024-10-03 DIAGNOSIS — E1140 Type 2 diabetes mellitus with diabetic neuropathy, unspecified: Secondary | ICD-10-CM

## 2024-10-03 DIAGNOSIS — I10 Essential (primary) hypertension: Secondary | ICD-10-CM | POA: Diagnosis not present

## 2024-10-03 LAB — COMPREHENSIVE METABOLIC PANEL WITH GFR
ALT: 11 U/L (ref 3–35)
AST: 16 U/L (ref 5–37)
Albumin: 4.5 g/dL (ref 3.5–5.2)
Alkaline Phosphatase: 72 U/L (ref 39–117)
BUN: 14 mg/dL (ref 6–23)
CO2: 30 meq/L (ref 19–32)
Calcium: 10 mg/dL (ref 8.4–10.5)
Chloride: 96 meq/L (ref 96–112)
Creatinine, Ser: 0.73 mg/dL (ref 0.40–1.20)
GFR: 80.59 mL/min
Glucose, Bld: 101 mg/dL — ABNORMAL HIGH (ref 70–99)
Potassium: 3.9 meq/L (ref 3.5–5.1)
Sodium: 132 meq/L — ABNORMAL LOW (ref 135–145)
Total Bilirubin: 0.5 mg/dL (ref 0.2–1.2)
Total Protein: 7.8 g/dL (ref 6.0–8.3)

## 2024-10-03 LAB — LIPID PANEL
Cholesterol: 216 mg/dL — ABNORMAL HIGH (ref 28–200)
HDL: 55.3 mg/dL
LDL Cholesterol: 138 mg/dL — ABNORMAL HIGH (ref 10–99)
NonHDL: 161
Total CHOL/HDL Ratio: 4
Triglycerides: 117 mg/dL (ref 10.0–149.0)
VLDL: 23.4 mg/dL (ref 0.0–40.0)

## 2024-10-03 LAB — CK: Total CK: 81 U/L (ref 17–177)

## 2024-10-03 LAB — HEMOGLOBIN A1C: Hgb A1c MFr Bld: 5.4 % (ref 4.6–6.5)

## 2024-10-04 ENCOUNTER — Ambulatory Visit: Payer: Self-pay | Admitting: Internal Medicine

## 2024-10-23 ENCOUNTER — Other Ambulatory Visit: Payer: Self-pay | Admitting: Internal Medicine

## 2024-10-23 ENCOUNTER — Other Ambulatory Visit: Payer: Self-pay | Admitting: Nurse Practitioner

## 2024-10-23 DIAGNOSIS — E876 Hypokalemia: Secondary | ICD-10-CM

## 2024-10-23 DIAGNOSIS — E1140 Type 2 diabetes mellitus with diabetic neuropathy, unspecified: Secondary | ICD-10-CM

## 2024-10-25 ENCOUNTER — Telehealth: Payer: Self-pay

## 2024-10-25 ENCOUNTER — Other Ambulatory Visit: Payer: Self-pay

## 2024-10-25 DIAGNOSIS — E876 Hypokalemia: Secondary | ICD-10-CM

## 2024-10-25 MED ORDER — POTASSIUM CHLORIDE CRYS ER 10 MEQ PO TBCR: EXTENDED_RELEASE_TABLET | ORAL | 3 refills | Status: AC

## 2024-10-25 NOTE — Telephone Encounter (Signed)
 Copied from CRM [id number]. Topic: Clinical - Medication Question >> Oct 25, 2024 11:40 AM Berneda FALCON wrote: Reason for CRM: Patient states that pharmacy told her that the PCP refused this medication. I do not see that here on our end and wanted to double check on that for her.  Medication:  potassium chloride  (KLOR-CON  M10) 10 MEQ tablet  Ph[facility]acy: [id number] - [place], [place] - [address] [place] [address] Phone: [phone] Fax: [phone] Hours: Not open 24 hours  Patient callback: [phone]([email or web address])

## 2024-10-25 NOTE — Telephone Encounter (Signed)
 Prescription has since been renewed. Looks like original prescription request had been sent to past NP. Called and informed patient and they expressed understanding

## 2024-11-12 ENCOUNTER — Ambulatory Visit: Admitting: Podiatry

## 2024-12-30 ENCOUNTER — Ambulatory Visit: Admitting: Internal Medicine

## 2025-03-26 ENCOUNTER — Ambulatory Visit
